# Patient Record
Sex: Male | Born: 1965 | Race: Black or African American | Hispanic: No | Smoking: Current every day smoker
Health system: Southern US, Community
[De-identification: ages and names within clinical notes are randomized; demographics above are authoritative.]

## PROBLEM LIST (undated history)

## (undated) DIAGNOSIS — Z8669 Personal history of other diseases of the nervous system and sense organs: Secondary | ICD-10-CM

## (undated) DIAGNOSIS — I219 Acute myocardial infarction, unspecified: Secondary | ICD-10-CM

## (undated) DIAGNOSIS — I10 Essential (primary) hypertension: Secondary | ICD-10-CM

---

## 1999-12-08 ENCOUNTER — Emergency Department (HOSPITAL_COMMUNITY): Admission: EM | Admit: 1999-12-08 | Discharge: 1999-12-08 | Payer: Self-pay | Admitting: Emergency Medicine

## 1999-12-13 ENCOUNTER — Emergency Department (HOSPITAL_COMMUNITY): Admission: EM | Admit: 1999-12-13 | Discharge: 1999-12-13 | Payer: Self-pay | Admitting: Emergency Medicine

## 1999-12-23 ENCOUNTER — Emergency Department (HOSPITAL_COMMUNITY): Admission: EM | Admit: 1999-12-23 | Discharge: 1999-12-23 | Payer: Self-pay | Admitting: Emergency Medicine

## 2005-07-17 ENCOUNTER — Emergency Department (HOSPITAL_COMMUNITY): Admission: EM | Admit: 2005-07-17 | Discharge: 2005-07-17 | Payer: Self-pay | Admitting: Emergency Medicine

## 2009-10-29 ENCOUNTER — Emergency Department (HOSPITAL_COMMUNITY): Admission: EM | Admit: 2009-10-29 | Discharge: 2009-10-29 | Payer: Self-pay | Admitting: Emergency Medicine

## 2010-09-01 ENCOUNTER — Inpatient Hospital Stay (INDEPENDENT_AMBULATORY_CARE_PROVIDER_SITE_OTHER)
Admission: RE | Admit: 2010-09-01 | Discharge: 2010-09-01 | Disposition: A | Discharge status: Home / Self Care | Payer: Self-pay | Source: Ambulatory Visit | Attending: Family Medicine | Admitting: Family Medicine

## 2010-09-01 DIAGNOSIS — S81809A Unspecified open wound, unspecified lower leg, initial encounter: Secondary | ICD-10-CM

## 2010-09-01 DIAGNOSIS — S81009A Unspecified open wound, unspecified knee, initial encounter: Secondary | ICD-10-CM

## 2012-11-13 ENCOUNTER — Emergency Department (INDEPENDENT_AMBULATORY_CARE_PROVIDER_SITE_OTHER)
Admission: EM | Admit: 2012-11-13 | Discharge: 2012-11-13 | Disposition: A | Discharge status: Home / Self Care | Payer: No Typology Code available for payment source | Source: Home / Self Care | Attending: Family Medicine | Admitting: Family Medicine

## 2012-11-13 ENCOUNTER — Encounter (HOSPITAL_COMMUNITY): Payer: Self-pay | Admitting: *Deleted

## 2012-11-13 DIAGNOSIS — I10 Essential (primary) hypertension: Secondary | ICD-10-CM

## 2012-11-13 HISTORY — DX: Essential (primary) hypertension: I10

## 2012-11-13 MED ORDER — LISINOPRIL-HYDROCHLOROTHIAZIDE 20-25 MG PO TABS: 1.0000 | ORAL_TABLET | Freq: Every day | ORAL | Status: DC

## 2012-11-13 NOTE — ED Provider Notes (Signed)
   History    CSN: [id number] Arrival date & time 11/13/12  1527  First MD Initiated Contact with Patient 11/13/12 1556     Chief Complaint  Patient presents with  . Hypertension  . Medication Refill   (Consider location/radiation/quality/duration/timing/severity/associated sxs/prior Treatment) Patient is a 47 y.o. male presenting with hypertension. The history is provided by the patient.  Hypertension This is a chronic problem. Episode onset: out of med for 1 mo, sx since fri. The problem has been gradually worsening. Associated symptoms include headaches. Pertinent negatives include no chest pain, no abdominal pain and no shortness of breath.   Past Medical History  Diagnosis Date  . Hypertension    History reviewed. No pertinent past surgical history. Family History  Problem Relation Age of Onset  . Hypertension Mother   . Heart attack Mother   . Cancer Father   . Hypertension Father    History  Substance Use Topics  . Smoking status: Current Every Day Smoker -- 1.00 packs/day    Types: Cigarettes  . Smokeless tobacco: Not on file  . Alcohol Use: 4.8 oz/week    8 Cans of beer per week    Review of Systems  Constitutional: Negative.   Respiratory: Negative for shortness of breath.   Cardiovascular: Negative for chest pain.  Gastrointestinal: Negative for abdominal pain.  Neurological: Positive for dizziness and headaches. Negative for weakness and numbness.    Allergies  Review of patient's allergies indicates no known allergies.  Home Medications   Current Outpatient Rx  Name  Route  Sig  Dispense  Refill  . lisinopril-hydrochlorothiazide (PRINZIDE,ZESTORETIC) 20-25 MG per tablet   Oral   Take 1 tablet by mouth daily.         Marland Kitchen lisinopril-hydrochlorothiazide (PRINZIDE,ZESTORETIC) 20-25 MG per tablet   Oral   Take 1 tablet by mouth daily.   30 tablet   1    BP 182/99  Pulse 80  Temp(Src) 98.3 F (36.8 C) (Oral)  Resp 16  SpO2 100% Physical Exam   Nursing note and vitals reviewed. Constitutional: He is oriented to person, place, and time. He appears well-developed and well-nourished.  Eyes: Conjunctivae and EOM are normal. Pupils are equal, round, and reactive to light.  Neck: Normal range of motion. Neck supple.  Cardiovascular: Regular rhythm and normal heart sounds.   Pulmonary/Chest: Breath sounds normal.  Musculoskeletal: He exhibits no edema.  Lymphadenopathy:    He has no cervical adenopathy.  Neurological: He is alert and oriented to person, place, and time.  Skin: Skin is warm and dry.    ED Course  Procedures (including critical care time) Labs Reviewed - No data to display No results found. 1. Hypertension     MDM    Linna Hoff, MD 11/13/12 [phone]

## 2012-11-13 NOTE — ED Notes (Signed)
C/o BP being up.  He has been out of BP medicine for 1 month.  Has had headache since Friday night.  C/o dizziness yesterday and today.  Did not go to work either day.

## 2012-11-14 ENCOUNTER — Encounter (HOSPITAL_COMMUNITY): Payer: Self-pay | Admitting: Emergency Medicine

## 2012-11-14 ENCOUNTER — Emergency Department (HOSPITAL_COMMUNITY)
Admission: EM | Admit: 2012-11-14 | Discharge: 2012-11-14 | Disposition: A | Discharge status: Home / Self Care | Payer: No Typology Code available for payment source | Attending: Emergency Medicine | Admitting: Emergency Medicine

## 2012-11-14 ENCOUNTER — Emergency Department (HOSPITAL_COMMUNITY): Payer: No Typology Code available for payment source

## 2012-11-14 DIAGNOSIS — F17200 Nicotine dependence, unspecified, uncomplicated: Secondary | ICD-10-CM | POA: Insufficient documentation

## 2012-11-14 DIAGNOSIS — I10 Essential (primary) hypertension: Secondary | ICD-10-CM | POA: Insufficient documentation

## 2012-11-14 DIAGNOSIS — Z79899 Other long term (current) drug therapy: Secondary | ICD-10-CM | POA: Insufficient documentation

## 2012-11-14 DIAGNOSIS — R109 Unspecified abdominal pain: Secondary | ICD-10-CM | POA: Insufficient documentation

## 2012-11-14 DIAGNOSIS — K2980 Duodenitis without bleeding: Secondary | ICD-10-CM | POA: Insufficient documentation

## 2012-11-14 LAB — COMPREHENSIVE METABOLIC PANEL
ALT: 16 U/L (ref 0–53)
Albumin: 4 g/dL (ref 3.5–5.2)
Alkaline Phosphatase: 86 U/L (ref 39–117)
BUN: 10 mg/dL (ref 6–23)
CO2: 31 mEq/L (ref 19–32)
Calcium: 10.8 mg/dL — ABNORMAL HIGH (ref 8.4–10.5)
Creatinine, Ser: 1.25 mg/dL (ref 0.50–1.35)
Glucose, Bld: 118 mg/dL — ABNORMAL HIGH (ref 70–99)
Potassium: 4.4 mEq/L (ref 3.5–5.1)
Sodium: 139 mEq/L (ref 135–145)
Total Bilirubin: 0.3 mg/dL (ref 0.3–1.2)
Total Protein: 7.8 g/dL (ref 6.0–8.3)

## 2012-11-14 LAB — URINALYSIS, ROUTINE W REFLEX MICROSCOPIC
Ketones, ur: 15 mg/dL — AB
Leukocytes, UA: NEGATIVE
Urobilinogen, UA: 0.2 mg/dL (ref 0.0–1.0)

## 2012-11-14 LAB — CBC
HCT: 53.5 % — ABNORMAL HIGH (ref 39.0–52.0)
Hemoglobin: 18.5 g/dL — ABNORMAL HIGH (ref 13.0–17.0)

## 2012-11-14 LAB — URINE MICROSCOPIC-ADD ON

## 2012-11-14 MED ORDER — PANTOPRAZOLE SODIUM 40 MG PO TBEC: 40.0000 mg | DELAYED_RELEASE_TABLET | Freq: Every day | ORAL | Status: DC

## 2012-11-14 MED ORDER — HYDROCODONE-ACETAMINOPHEN 5-325 MG PO TABS: 1.0000 | ORAL_TABLET | Freq: Four times a day (QID) | ORAL | Status: DC | PRN

## 2012-11-14 MED ORDER — HYDROCODONE-ACETAMINOPHEN 5-325 MG PO TABS
2.0000 | ORAL_TABLET | Freq: Once | ORAL | Status: AC
  Administered 2012-11-14: 2 via ORAL
  Filled 2012-11-14: qty 2

## 2012-11-14 NOTE — ED Provider Notes (Signed)
History    CSN: [id number] Arrival date & time 11/14/12  1539  First MD Initiated Contact with Patient 11/14/12 1607     Chief Complaint  Patient presents with  . Abdominal Pain   (Consider location/radiation/quality/duration/timing/severity/associated sxs/prior Treatment) Patient is a 47 y.o. male presenting with abdominal pain. The history is provided by the patient.  Abdominal Pain Pertinent negatives include no chest pain, no headaches and no shortness of breath.  pt c/o right flank pain intermittently in the course of the past year. Right flank posteriorly and laterally. Current pain started today. Constant. Dull. Waxes and wanes in intensity. Denies specific exacerbating or alleviating factors. No relation to eating. States was evaluated x 2 in [facility] approximately 8-9 ,months ago for same, had 2 ultrasounds was told were negative for gallstones. Denies hx kidney stones. No dysuria or hematuria. No trauma, fall or strain to area. No abd distension or prior surgery. Having normal bms incl today. No nv. Denies fever or chills. No cough or uri c/o. No pleuritic pain.    Past Medical History  Diagnosis Date  . Hypertension    History reviewed. No pertinent past surgical history. Family History  Problem Relation Age of Onset  . Hypertension Mother   . Heart attack Mother   . Cancer Father   . Hypertension Father    History  Substance Use Topics  . Smoking status: Current Every Day Smoker -- 1.00 packs/day    Types: Cigarettes  . Smokeless tobacco: Not on file  . Alcohol Use: 4.8 oz/week    8 Cans of beer per week    Review of Systems  Constitutional: Negative for fever.  HENT: Negative for neck pain.   Eyes: Negative for redness.  Respiratory: Negative for shortness of breath.   Cardiovascular: Negative for chest pain.  Gastrointestinal: Negative for vomiting, diarrhea and constipation.  Genitourinary: Positive for flank pain. Negative for dysuria, hematuria and  testicular pain.  Musculoskeletal: Negative for back pain.  Skin: Negative for rash.  Neurological: Negative for headaches.  Hematological: Does not bruise/bleed easily.  Psychiatric/Behavioral: Negative for confusion.    Allergies  Review of patient's allergies indicates no known allergies.  Home Medications   Current Outpatient Rx  Name  Route  Sig  Dispense  Refill  . aspirin-acetaminophen-caffeine (EXCEDRIN MIGRAINE) 250-250-65 MG per tablet   Oral   Take 1 tablet by mouth every 6 (six) hours as needed for pain. For pain         . lisinopril-hydrochlorothiazide (PRINZIDE,ZESTORETIC) 20-25 MG per tablet   Oral   Take 1 tablet by mouth daily.          BP 154/100  Pulse 63  Temp(Src) 97.9 F (36.6 C) (Oral)  Resp 18  SpO2 97% Physical Exam  Nursing note and vitals reviewed. Constitutional: He is oriented to person, place, and time. He appears well-developed and well-nourished. No distress.  HENT:  Head: Atraumatic.  Eyes: Conjunctivae are normal. No scleral icterus.  Neck: Neck supple. No tracheal deviation present.  Cardiovascular: Normal rate, regular rhythm, normal heart sounds and intact distal pulses.   Pulmonary/Chest: Effort normal and breath sounds normal. No accessory muscle usage. No respiratory distress.  Abdominal: Soft. Bowel sounds are normal. He exhibits no distension and no mass. There is no tenderness. There is no rebound and no guarding.  Genitourinary:  No cva tenderness  Musculoskeletal: Normal range of motion.  Spine nt.   Neurological: He is alert and oriented to person, place, and  time.  Skin: Skin is warm and dry.  No rash/shingles in area of pain  Psychiatric: He has a normal mood and affect.    ED Course  Procedures (including critical care time)  Results for orders placed during the hospital encounter of 11/14/12  LIPASE, BLOOD      Result Value Range   Lipase 38  11 - 59 U/L  CBC      Result Value Range   WBC 5.4  4.0 - 10.5  K/uL   RBC 5.92 (*) 4.22 - 5.81 MIL/uL   Hemoglobin 18.5 (*) 13.0 - 17.0 g/dL   HCT 69.6 (*) 29.5 - 28.4 %   MCV 90.4  78.0 - 100.0 fL   MCH 31.3  26.0 - 34.0 pg   MCHC 34.6  30.0 - 36.0 g/dL   RDW 13.2  44.0 - 10.2 %   Platelets 166  150 - 400 K/uL  COMPREHENSIVE METABOLIC PANEL      Result Value Range   Sodium 139  135 - 145 mEq/L   Potassium 4.4  3.5 - 5.1 mEq/L   Chloride 101  96 - 112 mEq/L   CO2 31  19 - 32 mEq/L   Glucose, Bld 118 (*) 70 - 99 mg/dL   BUN 10  6 - 23 mg/dL   Creatinine, Ser 7.25  0.50 - 1.35 mg/dL   Calcium 36.6 (*) 8.4 - 10.5 mg/dL   Total Protein 7.8  6.0 - 8.3 g/dL   Albumin 4.0  3.5 - 5.2 g/dL   AST 18  0 - 37 U/L   ALT 16  0 - 53 U/L   Alkaline Phosphatase 86  39 - 117 U/L   Total Bilirubin 0.3  0.3 - 1.2 mg/dL   GFR calc non Af Amer 67 (*) >90 mL/min   GFR calc Af Amer 78 (*) >90 mL/min  URINALYSIS, ROUTINE W REFLEX MICROSCOPIC      Result Value Range   Color, Urine YELLOW  YELLOW   APPearance CLEAR  CLEAR   Specific Gravity, Urine 1.026  1.005 - 1.030   pH 5.0  5.0 - 8.0   Glucose, UA NEGATIVE  NEGATIVE mg/dL   Hgb urine dipstick SMALL (*) NEGATIVE   Bilirubin Urine SMALL (*) NEGATIVE   Ketones, ur 15 (*) NEGATIVE mg/dL   Protein, ur NEGATIVE  NEGATIVE mg/dL   Urobilinogen, UA 0.2  0.0 - 1.0 mg/dL   Nitrite NEGATIVE  NEGATIVE   Leukocytes, UA NEGATIVE  NEGATIVE  URINE MICROSCOPIC-ADD ON      Result Value Range   RBC / HPF 0-2  <3 RBC/hpf   Urine-Other MUCOUS PRESENT     Ct Abdomen Pelvis Wo Contrast  11/14/2012   *RADIOLOGY REPORT*  Clinical Data: Right upper abdominal pain with nausea for 1 year.  CT ABDOMEN AND PELVIS WITHOUT CONTRAST  Technique:  Multidetector CT imaging of the abdomen and pelvis was performed following the standard protocol without intravenous contrast.  Comparison: None.  Findings: Lack of intravenous contrast decreases the sensitivity for entities other than urinary tract calculi.  There is respiratory motion at the  lung bases.  Lung bases are grossly clear.  The proximal duodenum has some fluid within its lumen and there is suggestion of a thickened duodenal wall, involving the proximal duodenum, most prominent the second portion the duodenum.  Single wall thickness of the second portion of the duodenum is estimated to be 10-11 mm in thickness (see image #32).  Additionally, there is  some fluid and stranding adjacent to the proximal duodenum (images 31 and 35).  The stomach is not very distended and appears grossly within normal limits.  Jejunal and ileal loops of the small bowel appear normal. Terminal ileum is unremarkable.  Normal appendix.  The colon is normal in caliber contains scattered diverticula.  The rectum is unremarkable.  No free intraperitoneal air identified.  Negative for urinary tract stones.  Both kidneys are normal in size and contour.  There is no hydronephrosis.  The ureters are normal in caliber and the urinary bladder appears normal.  Prostate gland is normal in size.  Abdominal aorta is normal in caliber.  Negative for lymphadenopathy of free air.  No acute or suspicious osseous abnormality.  IMPRESSION:  1.  Findings suspicious for duodenitis.  Apparent wall thickening of the proximal duodenum with a small amount of adjacent fluid. Suggest correlation with clinical history. Endoscopy could be considered for further evaluation. 2.  Negative for urinary tract stone disease.   Original Report Authenticated By: Britta Mccreedy, M.D.     MDM  Pt has ride, does not have to drive.  Hydrocodone po.  Xr. Labs.   Reviewed nursing notes and prior charts for additional history.   Recheck pt feels much improved. abd soft nt. No distension. No nv.  Will rx protonix, pain rx for possible duodenitis.   With benign abd exam and uremarkable vitals, pt appears stable for d/c .  Suzi Roots, MD 11/14/12 [phone]

## 2012-11-14 NOTE — ED Notes (Signed)
Pt c/o right upper abd pain with nausea x 1 year; pt sts had [place] x 2 without diagnosis

## 2016-06-24 ENCOUNTER — Emergency Department (HOSPITAL_COMMUNITY)
Admission: EM | Admit: 2016-06-24 | Discharge: 2016-06-24 | Disposition: A | Discharge status: Home / Self Care | Payer: No Typology Code available for payment source | Attending: Emergency Medicine | Admitting: Emergency Medicine

## 2016-06-24 ENCOUNTER — Encounter (HOSPITAL_COMMUNITY): Payer: Self-pay | Admitting: Emergency Medicine

## 2016-06-24 DIAGNOSIS — Z7982 Long term (current) use of aspirin: Secondary | ICD-10-CM | POA: Insufficient documentation

## 2016-06-24 DIAGNOSIS — I160 Hypertensive urgency: Secondary | ICD-10-CM | POA: Insufficient documentation

## 2016-06-24 DIAGNOSIS — R519 Headache, unspecified: Secondary | ICD-10-CM

## 2016-06-24 DIAGNOSIS — F17210 Nicotine dependence, cigarettes, uncomplicated: Secondary | ICD-10-CM | POA: Insufficient documentation

## 2016-06-24 DIAGNOSIS — Z79899 Other long term (current) drug therapy: Secondary | ICD-10-CM | POA: Insufficient documentation

## 2016-06-24 DIAGNOSIS — R51 Headache: Secondary | ICD-10-CM

## 2016-06-24 MED ORDER — LISINOPRIL-HYDROCHLOROTHIAZIDE 20-25 MG PO TABS: 1.0000 | ORAL_TABLET | Freq: Every day | ORAL | 0 refills | Status: DC

## 2016-06-24 NOTE — Discharge Instructions (Signed)
As discussed, your evaluation today has been largely reassuring.  But, it is important that you monitor your condition carefully, and do not hesitate to return to the ED if you develop new, or concerning changes in your condition. ? ?Otherwise, please follow-up with your physician for appropriate ongoing care. ? ?

## 2016-06-24 NOTE — ED Notes (Signed)
ED Provider at bedside. 

## 2016-06-24 NOTE — ED Provider Notes (Signed)
[facility]-EMERGENCY DEPT Provider Note   CSN: [id number] Arrival date & time: 06/24/16  1330   By signing my name below, I, Clovis Pu, attest that this documentation has been prepared under the direction and in the presence of Gerhard Munch, MD  Electronically Signed: Clovis Pu, ED Scribe. 06/24/16. 5:14 PM.   History   Chief Complaint Chief Complaint  Patient presents with  . Hypertension  . Migraine   The history is provided by the patient. No language interpreter was used.   HPI Comments:  Lawrence Pope is a 51 y.o. male, with a hx of HTN and migraines, who presents to the Emergency Department complaining of a acute onset, gradually improving headache onset 2 days. Pt reports he did cocaine yesterday. He has had Aleve with relief. He states he took a nap in the waiting room which also provided relief. Pt reports he is currently not on any medication for HTN. Pt denies chest pain, SOB, nausea, vomiting, vision changes or any other associated symptoms. Pt is a smoker.   Past Medical History:  Diagnosis Date  . Hypertension     There are no active problems to display for this patient.   History reviewed. No pertinent surgical history.     Home Medications    Prior to Admission medications   Medication Sig Start Date End Date Taking? Authorizing Provider  aspirin-acetaminophen-caffeine (EXCEDRIN MIGRAINE) [phone] MG per tablet Take 1 tablet by mouth every 6 (six) hours as needed for pain. For pain    Historical Provider, MD  HYDROcodone-acetaminophen (NORCO/VICODIN) 5-325 MG per tablet Take 1-2 tablets by mouth every 6 (six) hours as needed for pain. 11/14/12   Cathren Laine, MD  lisinopril-hydrochlorothiazide (PRINZIDE,ZESTORETIC) 20-25 MG per tablet Take 1 tablet by mouth daily.    Historical Provider, MD  pantoprazole (PROTONIX) 40 MG tablet Take 1 tablet (40 mg total) by mouth daily. 11/14/12   Cathren Laine, MD    Family History Family History  Problem Relation  Age of Onset  . Hypertension Mother   . Heart attack Mother   . Cancer Father   . Hypertension Father     Social History Social History  Substance Use Topics  . Smoking status: Current Every Day Smoker    Packs/day: 1.00    Types: Cigarettes  . Smokeless tobacco: Not on file  . Alcohol use 4.8 oz/week    8 Cans of beer per week     Allergies   Patient has no known allergies.   Review of Systems Review of Systems  Constitutional: Negative for fever.  Eyes: Negative for visual disturbance.  Respiratory: Negative for shortness of breath.   Cardiovascular: Negative for chest pain.  Gastrointestinal: Negative for nausea and vomiting.  Musculoskeletal:       Negative aside from HPI  Skin:       Negative aside from HPI  Allergic/Immunologic: Negative for immunocompromised state.  Neurological: Positive for headaches. Negative for weakness.  All other systems reviewed and are negative.  Physical Exam Updated Vital Signs BP 156/97 (BP Location: Right Arm)   Pulse 79   Temp 97.4 F (36.3 C) (Oral)   Resp 16   Ht 6' 10.5" (2.096 m)   Wt 190 lb (86.2 kg)   SpO2 97%   BMI 19.63 kg/m   Physical Exam  Constitutional: He is oriented to person, place, and time. He appears well-developed. No distress.  HENT:  Head: Normocephalic and atraumatic.  Eyes: Conjunctivae and EOM are normal. Pupils are equal,  round, and reactive to light.  Cardiovascular: Normal rate and regular rhythm.   Pulmonary/Chest: Effort normal. No stridor. No respiratory distress.  Abdominal: He exhibits no distension.  Musculoskeletal: He exhibits no edema.  Neurological: He is alert and oriented to person, place, and time.  Skin: Skin is warm and dry.  Psychiatric: He has a normal mood and affect.  Nursing note and vitals reviewed.  ED Treatments / Results  DIAGNOSTIC STUDIES:  Oxygen Saturation is 97% on RA, normal by my interpretation.    COORDINATION OF CARE:  5:08 PM Discussed treatment  plan with pt at bedside and pt agreed to plan.  EKG with sinus rhythm, rate 53, nonspecific T-wave changes, LVH, abnormal  Procedures Procedures (including critical care time)   Initial Impression / Assessment and Plan / ED Course  I have reviewed the triage vital signs and the nursing notes.  Pertinent labs & imaging results that were available during my care of the patient were reviewed by me and considered in my medical decision making (see chart for details).  Patient with history of migraines, cocaine is presents with headache. No evidence for neurologic dysfunction, distress. Patient's improvement here is reassuring, as is his history Patient does have hypertension, has been noncompliant with medication. Patient provided additional medications, and resources to follow-up with primary care. With no chest pain, dyspnea, no neurologic dysfunction, no indication for additional labs, imaging. Patient discharged in stable condition.  Final Clinical Impressions(s) / ED Diagnoses   Final diagnoses:  Bad headache  Hypertensive urgency    New Prescriptions Current Discharge Medication List     I personally performed the services described in this documentation, which was scribed in my presence. The recorded information has been reviewed and is accurate.        Gerhard Munchobert Jemery Stacey, MD 06/24/16 [phone]

## 2016-06-24 NOTE — ED Triage Notes (Signed)
Pt here for nasal congestion and HA; pt sts has not had his htn meds x 1 year and admit to cocaine last night

## 2016-09-08 ENCOUNTER — Ambulatory Visit (HOSPITAL_COMMUNITY)
Admission: EM | Admit: 2016-09-08 | Discharge: 2016-09-08 | Disposition: A | Discharge status: Home / Self Care | Payer: No Typology Code available for payment source | Attending: Internal Medicine | Admitting: Internal Medicine

## 2016-09-08 ENCOUNTER — Encounter (HOSPITAL_COMMUNITY): Payer: Self-pay | Admitting: Emergency Medicine

## 2016-09-08 DIAGNOSIS — R51 Headache: Secondary | ICD-10-CM

## 2016-09-08 DIAGNOSIS — Z720 Tobacco use: Secondary | ICD-10-CM

## 2016-09-08 DIAGNOSIS — D751 Secondary polycythemia: Secondary | ICD-10-CM

## 2016-09-08 DIAGNOSIS — I10 Essential (primary) hypertension: Secondary | ICD-10-CM

## 2016-09-08 DIAGNOSIS — R519 Headache, unspecified: Secondary | ICD-10-CM

## 2016-09-08 DIAGNOSIS — Z9189 Other specified personal risk factors, not elsewhere classified: Secondary | ICD-10-CM

## 2016-09-08 HISTORY — DX: Personal history of other diseases of the nervous system and sense organs: Z86.69

## 2016-09-08 LAB — POCT I-STAT, CHEM 8
BUN: 11 mg/dL (ref 6–20)
CALCIUM ION: 1.2 mmol/L (ref 1.15–1.40)
CHLORIDE: 108 mmol/L (ref 101–111)
CREATININE: 1.3 mg/dL — AB (ref 0.61–1.24)
Glucose, Bld: 101 mg/dL — ABNORMAL HIGH (ref 65–99)
HCT: 53 % — ABNORMAL HIGH (ref 39.0–52.0)
Hemoglobin: 18 g/dL — ABNORMAL HIGH (ref 13.0–17.0)
Potassium: 3.8 mmol/L (ref 3.5–5.1)
SODIUM: 143 mmol/L (ref 135–145)
TCO2: 26 mmol/L (ref 0–100)

## 2016-09-08 MED ORDER — HYDROCHLOROTHIAZIDE 25 MG PO TABS: 25.0000 mg | ORAL_TABLET | Freq: Every day | ORAL | 0 refills | Status: DC

## 2016-09-08 NOTE — ED Triage Notes (Signed)
[facility] a [facility] of a headache x 2 days. The patient reported some vision changes and a hx of migraines.

## 2016-09-08 NOTE — Discharge Instructions (Signed)
If your symptoms worsen recommend you go immediately to the emergency room for further evaluation and treatment.  Recommend that you contact [facility] schedule an appointment immediately with a primary care physician. With the risk factors and diagnosis discussed at today's visit you are at significant risk for stroke.

## 2016-09-08 NOTE — ED Provider Notes (Signed)
CSN: [id number]     Arrival date & time 09/08/16  1713 History   None    Chief Complaint  Patient presents with  . Migraine   (Consider location/radiation/quality/duration/timing/severity/associated sxs/prior Treatment) HPI Patient comes in today with headache 2 days. States that he has a history of migraines and hypertension. He is noncompliant with therapy. States that he has been off blood pressure meds for about 8-9 months. Does not currently have a primary care physician. Patient was just seen in the emergency room February 28th 2018 for the same complaint. He admits having some blurred vision also states that he has not been wearing his glasses and needs a new prescription for that. Hasn't having some photophobia not bad. Denies lightheadedness dizziness, fatigue, chest pain, shortness of breath, nausea/vomiting. Patient was smoking 3+ packs per week but may be more. He also has a history of cocaine use but states that he has not used this for about 3 months. States that he drinks 1 beer every other day.  States that he needs a refill of his lisinopril. Past Medical History:  Diagnosis Date  . Hx of migraines   . Hypertension    History reviewed. No pertinent surgical history. Family History  Problem Relation Age of Onset  . Hypertension Mother   . Heart attack Mother   . Cancer Father   . Hypertension Father    Social History  Substance Use Topics  . Smoking status: Current Every Day Smoker    Packs/day: 1.00    Types: Cigarettes  . Smokeless tobacco: Never Used  . Alcohol use 4.8 oz/week    8 Cans of beer per week    Review of Systems  Constitutional: Positive for activity change. Negative for chills, fatigue and fever.  HENT: Negative.   Eyes: Positive for photophobia.  Respiratory: Negative.   Cardiovascular: Negative.   Gastrointestinal: Negative.   Genitourinary: Negative.   Musculoskeletal: Negative.   Allergic/Immunologic: Negative.   Neurological: Negative.    Psychiatric/Behavioral: Negative.     Allergies  Patient has no known allergies.  Home Medications   Prior to Admission medications   Medication Sig Start Date End Date Taking? Authorizing Provider  hydrochlorothiazide (HYDRODIURIL) 25 MG tablet Take 1 tablet (25 mg total) by mouth daily. 09/08/16   Naida Sleightwens, Sundae Maners M, PA-C   Meds Ordered and Administered this Visit  Medications - No data to display  BP (!) 177/101 (BP Location: Right Arm)   Pulse 73   Temp 98.5 F (36.9 C) (Oral)   Resp 18   SpO2 97%  No data found.   Physical Exam  Constitutional: He is oriented to person, place, and time. He appears well-developed and well-nourished. No distress.  HENT:  Head: Normocephalic and atraumatic.  Eyes: EOM are normal. Pupils are equal, round, and reactive to light.  Neck: Normal range of motion.  Cardiovascular: Normal rate and regular rhythm.   No murmur heard. Pulmonary/Chest: Breath sounds normal. No respiratory distress.  Abdominal: He exhibits no distension.  Musculoskeletal: Normal range of motion.  Lymphadenopathy:    He has no cervical adenopathy.  Neurological: He is alert and oriented to person, place, and time.  Skin: Skin is warm and dry.    Urgent Care Course     Procedures (including critical care time)  Labs Review Labs Reviewed  POCT I-STAT, CHEM 8 - Abnormal; Notable for the following:       Result Value   Creatinine, Ser 1.30 (*)    Glucose, Bld  101 (*)    Hemoglobin 18.0 (*)    HCT 53.0 (*)    All other components within normal limits    Imaging Review No results found.   Visual Acuity Review  Right Eye Distance:   Left Eye Distance:   Bilateral Distance:    Right Eye Near:   Left Eye Near:    Bilateral Near:         MDM   1. Bad headache   2. Hypertension, unspecified type   3. Nicotine abuse   4. Polycythemia   5. At risk for stroke    Meds ordered this encounter  Medications  . hydrochlorothiazide (HYDRODIURIL) 25 MG  tablet    Sig: Take 1 tablet (25 mg total) by mouth daily.    Dispense:  30 tablet    Refill:  0   Stress to patient the importance of getting established with a primary care physician and he was given the number and address to contact [facility] immediately. Advised that with the diagnosis that was made today along with his risk factors he is at significant risk for stroke. This was discussed in great detail. He voices understanding and use over-the-counter Tylenol and/or ibuprofen as needed for headache. Labs were reviewed with patient today. Serum creatinine 1.30 and hemoglobin 18.0.   Naida Sleight, PA-C 09/08/16 2047

## 2016-10-03 ENCOUNTER — Encounter (HOSPITAL_COMMUNITY): Payer: Self-pay | Admitting: Emergency Medicine

## 2016-10-03 DIAGNOSIS — F17210 Nicotine dependence, cigarettes, uncomplicated: Secondary | ICD-10-CM | POA: Insufficient documentation

## 2016-10-03 DIAGNOSIS — Z79899 Other long term (current) drug therapy: Secondary | ICD-10-CM | POA: Insufficient documentation

## 2016-10-03 DIAGNOSIS — K529 Noninfective gastroenteritis and colitis, unspecified: Secondary | ICD-10-CM | POA: Insufficient documentation

## 2016-10-03 DIAGNOSIS — E876 Hypokalemia: Secondary | ICD-10-CM | POA: Insufficient documentation

## 2016-10-03 DIAGNOSIS — I10 Essential (primary) hypertension: Secondary | ICD-10-CM | POA: Insufficient documentation

## 2016-10-03 DIAGNOSIS — N179 Acute kidney failure, unspecified: Secondary | ICD-10-CM | POA: Insufficient documentation

## 2016-10-03 LAB — COMPREHENSIVE METABOLIC PANEL
ALBUMIN: 2.9 g/dL — AB (ref 3.5–5.0)
ALT: 27 U/L (ref 17–63)
ANION GAP: 12 (ref 5–15)
AST: 31 U/L (ref 15–41)
Alkaline Phosphatase: 53 U/L (ref 38–126)
BILIRUBIN TOTAL: 0.5 mg/dL (ref 0.3–1.2)
BUN: 17 mg/dL (ref 6–20)
CO2: 23 mmol/L (ref 22–32)
Calcium: 8.9 mg/dL (ref 8.9–10.3)
Chloride: 95 mmol/L — ABNORMAL LOW (ref 101–111)
Creatinine, Ser: 1.89 mg/dL — ABNORMAL HIGH (ref 0.61–1.24)
GFR calc Af Amer: 46 mL/min — ABNORMAL LOW (ref >60)
GFR calc non Af Amer: 40 mL/min — ABNORMAL LOW (ref >60)
GLUCOSE: 122 mg/dL — AB (ref 65–99)
POTASSIUM: 2.9 mmol/L — AB (ref 3.5–5.1)
SODIUM: 130 mmol/L — AB (ref 135–145)
TOTAL PROTEIN: 7.3 g/dL (ref 6.5–8.1)

## 2016-10-03 LAB — LIPASE, BLOOD: Lipase: 41 U/L (ref 11–51)

## 2016-10-03 LAB — CBC
HEMATOCRIT: 51.1 % (ref 39.0–52.0)
HEMOGLOBIN: 17.6 g/dL — AB (ref 13.0–17.0)
MCH: 30.3 pg (ref 26.0–34.0)
MCHC: 34.4 g/dL (ref 30.0–36.0)
MCV: 88.1 fL (ref 78.0–100.0)
Platelets: 138 10*3/uL — ABNORMAL LOW (ref 150–400)
RBC: 5.8 MIL/uL (ref 4.22–5.81)
RDW: 13.7 % (ref 11.5–15.5)
WBC: 8.7 10*3/uL (ref 4.0–10.5)

## 2016-10-03 NOTE — ED Notes (Signed)
Updated on wait time at this time.

## 2016-10-03 NOTE — ED Triage Notes (Signed)
Pt states having 10/10 abd pain with nausea, vomiting and diarrhea for the past 3 days, denies any fever or chills.

## 2016-10-04 ENCOUNTER — Emergency Department (HOSPITAL_COMMUNITY)
Admission: EM | Admit: 2016-10-04 | Discharge: 2016-10-04 | Disposition: A | Discharge status: Home / Self Care | Payer: Self-pay | Attending: Emergency Medicine | Admitting: Emergency Medicine

## 2016-10-04 ENCOUNTER — Emergency Department (HOSPITAL_COMMUNITY): Payer: Self-pay

## 2016-10-04 ENCOUNTER — Emergency Department (HOSPITAL_COMMUNITY)
Admission: EM | Admit: 2016-10-04 | Discharge: 2016-10-04 | Disposition: A | Discharge status: Home / Self Care | Payer: No Typology Code available for payment source | Attending: Emergency Medicine | Admitting: Emergency Medicine

## 2016-10-04 ENCOUNTER — Encounter (HOSPITAL_COMMUNITY): Payer: Self-pay | Admitting: Emergency Medicine

## 2016-10-04 DIAGNOSIS — Z79899 Other long term (current) drug therapy: Secondary | ICD-10-CM | POA: Insufficient documentation

## 2016-10-04 DIAGNOSIS — K529 Noninfective gastroenteritis and colitis, unspecified: Secondary | ICD-10-CM

## 2016-10-04 DIAGNOSIS — I10 Essential (primary) hypertension: Secondary | ICD-10-CM | POA: Insufficient documentation

## 2016-10-04 DIAGNOSIS — R197 Diarrhea, unspecified: Secondary | ICD-10-CM | POA: Insufficient documentation

## 2016-10-04 DIAGNOSIS — E876 Hypokalemia: Secondary | ICD-10-CM

## 2016-10-04 DIAGNOSIS — N179 Acute kidney failure, unspecified: Secondary | ICD-10-CM

## 2016-10-04 DIAGNOSIS — R112 Nausea with vomiting, unspecified: Secondary | ICD-10-CM | POA: Insufficient documentation

## 2016-10-04 DIAGNOSIS — J189 Pneumonia, unspecified organism: Secondary | ICD-10-CM | POA: Insufficient documentation

## 2016-10-04 DIAGNOSIS — F17210 Nicotine dependence, cigarettes, uncomplicated: Secondary | ICD-10-CM | POA: Insufficient documentation

## 2016-10-04 LAB — URINALYSIS, ROUTINE W REFLEX MICROSCOPIC
Bacteria, UA: NONE SEEN
Bilirubin Urine: NEGATIVE
Bilirubin Urine: NEGATIVE
GLUCOSE, UA: NEGATIVE mg/dL
GLUCOSE, UA: NEGATIVE mg/dL
KETONES UR: NEGATIVE mg/dL
Ketones, ur: NEGATIVE mg/dL
Leukocytes, UA: NEGATIVE
Leukocytes, UA: NEGATIVE
NITRITE: NEGATIVE
NITRITE: NEGATIVE
PH: 5 (ref 5.0–8.0)
PH: 5 (ref 5.0–8.0)
Protein, ur: 100 mg/dL — AB
Protein, ur: >=300 mg/dL — AB
SPECIFIC GRAVITY, URINE: 1.025 (ref 1.005–1.030)
SPECIFIC GRAVITY, URINE: 1.033 — AB (ref 1.005–1.030)
WBC, UA: NONE SEEN WBC/hpf (ref 0–5)

## 2016-10-04 LAB — CBC WITH DIFFERENTIAL/PLATELET
Basophils Absolute: 0 10*3/uL (ref 0.0–0.1)
Basophils Relative: 0 %
Eosinophils Absolute: 0 10*3/uL (ref 0.0–0.7)
Eosinophils Relative: 0 %
HEMATOCRIT: 42.9 % (ref 39.0–52.0)
HEMOGLOBIN: 14.7 g/dL (ref 13.0–17.0)
LYMPHS ABS: 0.8 10*3/uL (ref 0.7–4.0)
Lymphocytes Relative: 11 %
MCH: 29.8 pg (ref 26.0–34.0)
MCHC: 34.3 g/dL (ref 30.0–36.0)
MCV: 86.8 fL (ref 78.0–100.0)
MONO ABS: 0.5 10*3/uL (ref 0.1–1.0)
Monocytes Relative: 7 %
NEUTROS ABS: 6.3 10*3/uL (ref 1.7–7.7)
NEUTROS PCT: 82 %
Platelets: 123 10*3/uL — ABNORMAL LOW (ref 150–400)
RBC: 4.94 MIL/uL (ref 4.22–5.81)
RDW: 13.5 % (ref 11.5–15.5)
WBC: 7.7 10*3/uL (ref 4.0–10.5)

## 2016-10-04 LAB — COMPREHENSIVE METABOLIC PANEL
ALK PHOS: 44 U/L (ref 38–126)
ALT: 30 U/L (ref 17–63)
ANION GAP: 7 (ref 5–15)
AST: 42 U/L — ABNORMAL HIGH (ref 15–41)
Albumin: 2.3 g/dL — ABNORMAL LOW (ref 3.5–5.0)
BILIRUBIN TOTAL: 0.4 mg/dL (ref 0.3–1.2)
BUN: 13 mg/dL (ref 6–20)
CALCIUM: 8.2 mg/dL — AB (ref 8.9–10.3)
CO2: 22 mmol/L (ref 22–32)
Chloride: 102 mmol/L (ref 101–111)
Creatinine, Ser: 1.21 mg/dL (ref 0.61–1.24)
Glucose, Bld: 131 mg/dL — ABNORMAL HIGH (ref 65–99)
POTASSIUM: 2.9 mmol/L — AB (ref 3.5–5.1)
Sodium: 131 mmol/L — ABNORMAL LOW (ref 135–145)
TOTAL PROTEIN: 5.9 g/dL — AB (ref 6.5–8.1)

## 2016-10-04 LAB — LIPASE, BLOOD: LIPASE: 77 U/L — AB (ref 11–51)

## 2016-10-04 LAB — MAGNESIUM
MAGNESIUM: 1.9 mg/dL (ref 1.7–2.4)
Magnesium: 1.9 mg/dL (ref 1.7–2.4)

## 2016-10-04 LAB — C DIFFICILE QUICK SCREEN W PCR REFLEX
C DIFFICILE (CDIFF) INTERP: NOT DETECTED
C DIFFICILE (CDIFF) TOXIN: NEGATIVE
C Diff antigen: NEGATIVE

## 2016-10-04 LAB — I-STAT CG4 LACTIC ACID, ED: Lactic Acid, Venous: 0.76 mmol/L (ref 0.5–1.9)

## 2016-10-04 LAB — CK: Total CK: 131 U/L (ref 49–397)

## 2016-10-04 MED ORDER — PROMETHAZINE HCL 25 MG PO TABS: 25.0000 mg | ORAL_TABLET | Freq: Four times a day (QID) | ORAL | 0 refills | Status: DC | PRN

## 2016-10-04 MED ORDER — LEVOFLOXACIN 500 MG PO TABS: 500.0000 mg | ORAL_TABLET | Freq: Every day | ORAL | 0 refills | Status: DC

## 2016-10-04 MED ORDER — SODIUM CHLORIDE 0.9 % IV BOLUS (SEPSIS)
1000.0000 mL | Freq: Once | INTRAVENOUS | Status: AC
  Administered 2016-10-04: 1000 mL via INTRAVENOUS

## 2016-10-04 MED ORDER — ONDANSETRON HCL 4 MG/2ML IJ SOLN
4.0000 mg | Freq: Once | INTRAMUSCULAR | Status: AC
  Administered 2016-10-04: 4 mg via INTRAVENOUS
  Filled 2016-10-04: qty 2

## 2016-10-04 MED ORDER — ONDANSETRON 4 MG PO TBDP: 4.0000 mg | ORAL_TABLET | Freq: Three times a day (TID) | ORAL | 0 refills | Status: DC | PRN

## 2016-10-04 MED ORDER — ONDANSETRON HCL 4 MG PO TABS: 4.0000 mg | ORAL_TABLET | Freq: Three times a day (TID) | ORAL | 0 refills | Status: DC | PRN

## 2016-10-04 MED ORDER — ACETAMINOPHEN 325 MG PO TABS: 650.0000 mg | ORAL_TABLET | Freq: Once | ORAL | Status: DC | PRN

## 2016-10-04 MED ORDER — LEVOFLOXACIN 500 MG PO TABS
500.0000 mg | ORAL_TABLET | Freq: Once | ORAL | Status: AC
  Administered 2016-10-04: 500 mg via ORAL
  Filled 2016-10-04: qty 1

## 2016-10-04 MED ORDER — POTASSIUM CHLORIDE CRYS ER 20 MEQ PO TBCR
20.0000 meq | EXTENDED_RELEASE_TABLET | Freq: Once | ORAL | Status: AC
  Administered 2016-10-04: 20 meq via ORAL
  Filled 2016-10-04: qty 1

## 2016-10-04 MED ORDER — IOPAMIDOL (ISOVUE-300) INJECTION 61%
INTRAVENOUS | Status: AC
  Filled 2016-10-04: qty 30

## 2016-10-04 MED ORDER — ACETAMINOPHEN 325 MG PO TABS
ORAL_TABLET | ORAL | Status: AC
  Administered 2016-10-04: 650 mg
  Filled 2016-10-04: qty 2

## 2016-10-04 MED ORDER — POTASSIUM CHLORIDE CRYS ER 20 MEQ PO TBCR: 20.0000 meq | EXTENDED_RELEASE_TABLET | Freq: Every day | ORAL | 0 refills | Status: DC

## 2016-10-04 MED ORDER — IBUPROFEN 400 MG PO TABS
600.0000 mg | ORAL_TABLET | Freq: Once | ORAL | Status: AC
  Administered 2016-10-04: 600 mg via ORAL
  Filled 2016-10-04: qty 1

## 2016-10-04 MED ORDER — POTASSIUM CHLORIDE CRYS ER 20 MEQ PO TBCR
40.0000 meq | EXTENDED_RELEASE_TABLET | Freq: Once | ORAL | Status: AC
  Administered 2016-10-04: 40 meq via ORAL
  Filled 2016-10-04: qty 2

## 2016-10-04 MED ORDER — ACETAMINOPHEN 325 MG PO TABS: 325.0000 mg | ORAL_TABLET | Freq: Once | ORAL | Status: DC

## 2016-10-04 NOTE — ED Notes (Signed)
Patient transported to X-ray 

## 2016-10-04 NOTE — ED Provider Notes (Signed)
The patient is a 51 year old male, he has a history of 3 days of diarrhea and vomiting, states that he has had multiple episodes of watery diarrhea, it is improved over the last 24 hours and is only had 2 episodes. There is no abdominal pain but he has developed a fever of 103. On exam the patient does have some right upper quadrant tenderness which is mild, there is no Murphy sign, there is no other abdominal tenderness. His lungs and heart are clear, no tachycardia, no edema, mucous membranes are slightly dehydrated.  Evaluate with CT scan for further evaluation of the source of the patient's fever and abdominal discomfort though this could be nothing more than a gastroenteritis which is gradually improving. Both potassium and sodium seem better today, IV fluids will be given. Noncontrast CT scan of the abdomen to evaluate further. The patient is in agreement.  Medical screening examination/treatment/procedure(s) were conducted as a shared visit with non-physician practitioner(s) and myself.  I personally evaluated the patient during the encounter.  Clinical Impression:   Final diagnoses:  Nausea vomiting and diarrhea  Community acquired pneumonia, unspecified laterality         Lawrence Pope, Lawrence Hudman, MD 10/06/16 1228

## 2016-10-04 NOTE — ED Notes (Signed)
ED Provider at bedside. 

## 2016-10-04 NOTE — ED Provider Notes (Signed)
[facility]-EMERGENCY DEPT Provider Note   CSN: [id number] Arrival date & time: 10/03/16  1937  By signing my name below, I, Lawrence Pope, attest that this documentation has been prepared under the direction and in the presence of No att. providers found . Electronically Signed: Phillips Pope, Scribe. 10/04/2016. 12:46 AM.  History   Chief Complaint Chief Complaint  Patient presents with  . Abdominal Pain   HPI Comments Lawrence Pope is a 51 y.o. male with a PMHx significant for HTN and migraines, who presents to the Emergency Department with complaints of sudden onset nausea, vomiting and diarrhea x3 days, after eating a steak.  He additionally endorses dizziness.  Pt's diarrhea has improved since onset, however, his nausea and vomiting has remained constant.  Low PO intake 2/2 to aforementioned sx.  He denies experiencing any any abdominal pain. No blood in stool or melena. No hematuria.  No fevers.  Back pain at baseline.   The history is provided by the patient and medical records. No language interpreter was used.    Past Medical History:  Diagnosis Date  . Hx of migraines   . Hypertension     There are no active problems to display for this patient.   History reviewed. No pertinent surgical history.     Home Medications    Prior to Admission medications   Medication Sig Start Date End Date Taking? Authorizing Provider  hydrochlorothiazide (HYDRODIURIL) 25 MG tablet Take 1 tablet (25 mg total) by mouth daily. 09/08/16   Naida Sleight, PA-C  ondansetron (ZOFRAN ODT) 4 MG disintegrating tablet Take 1 tablet (4 mg total) by mouth every 8 (eight) hours as needed for nausea or vomiting. 10/04/16   Pricilla Loveless, MD  potassium chloride SA (K-DUR,KLOR-CON) 20 MEQ tablet Take 1 tablet (20 mEq total) by mouth daily. 10/04/16   Pricilla Loveless, MD  promethazine (PHENERGAN) 25 MG tablet Take 1 tablet (25 mg total) by mouth every 6 (six) hours as needed for nausea or vomiting.  10/04/16   Pricilla Loveless, MD    Family History Family History  Problem Relation Age of Onset  . Hypertension Mother   . Heart attack Mother   . Cancer Father   . Hypertension Father     Social History Social History  Substance Use Topics  . Smoking status: Current Every Day Smoker    Packs/day: 1.00    Types: Cigarettes  . Smokeless tobacco: Never Used  . Alcohol use 4.8 oz/week    8 Cans of beer per week     Allergies   Patient has no known allergies.   Review of Systems Review of Systems  Constitutional: Negative for fever.  Gastrointestinal: Positive for diarrhea (Resolved), nausea and vomiting. Negative for abdominal pain and blood in stool.  Genitourinary: Negative for hematuria.  Musculoskeletal: Positive for back pain (Baseline).  Neurological: Positive for dizziness.   Physical Exam Updated Vital Signs BP (!) 142/88   Pulse 86   Temp 98.4 F (36.9 C) (Oral)   Resp 16   Ht 5\' 11"  (1.803 m)   Wt 86.2 kg (190 lb)   SpO2 96%   BMI 26.50 kg/m   Physical Exam  Constitutional: He is oriented to person, place, and time. He appears well-developed and well-nourished.  HENT:  Head: Normocephalic and atraumatic.  Right Ear: External ear normal.  Left Ear: External ear normal.  Nose: Nose normal.  Mildly dry mucous membranes.   Eyes: Right eye exhibits no discharge. Left eye  exhibits no discharge.  Neck: Neck supple.  Cardiovascular: Normal rate, regular rhythm and normal heart sounds.   Pulmonary/Chest: Effort normal and breath sounds normal.  Abdominal: Soft. There is no tenderness.  No CVA tenderness.  Musculoskeletal: He exhibits no edema.  Neurological: He is alert and oriented to person, place, and time.  Skin: Skin is warm and dry.  Nursing note and vitals reviewed.    ED Treatments / Results  DIAGNOSTIC STUDIES: Oxygen Saturation is 96% on room air, adequate by my interpretation.    COORDINATION OF CARE: 12:28 AM Discussed treatment plan  with pt at bedside and pt agreed to plan.   Labs (all labs ordered are listed, but only abnormal results are displayed) Labs Reviewed  COMPREHENSIVE METABOLIC PANEL - Abnormal; Notable for the following:       Result Value   Sodium 130 (*)    Potassium 2.9 (*)    Chloride 95 (*)    Glucose, Bld 122 (*)    Creatinine, Ser 1.89 (*)    Albumin 2.9 (*)    GFR calc non Af Amer 40 (*)    GFR calc Af Amer 46 (*)    All other components within normal limits  CBC - Abnormal; Notable for the following:    Hemoglobin 17.6 (*)    Platelets 138 (*)    All other components within normal limits  URINALYSIS, ROUTINE W REFLEX MICROSCOPIC - Abnormal; Notable for the following:    Color, Urine AMBER (*)    APPearance CLOUDY (*)    Specific Gravity, Urine 1.033 (*)    Hgb urine dipstick MODERATE (*)    Protein, ur >=300 (*)    Squamous Epithelial / LPF 0-5 (*)    All other components within normal limits  LIPASE, BLOOD  MAGNESIUM  CK    EKG  EKG Interpretation None       Radiology No results found.  Procedures Procedures (including critical care time)  Medications Ordered in ED Medications  sodium chloride 0.9 % bolus 1,000 mL (0 mLs Intravenous Stopped 10/04/16 0200)  sodium chloride 0.9 % bolus 1,000 mL (0 mLs Intravenous Stopped 10/04/16 0107)  ondansetron (ZOFRAN) injection 4 mg (4 mg Intravenous Given 10/04/16 0107)  potassium chloride SA (K-DUR,KLOR-CON) CR tablet 40 mEq (40 mEq Oral Given 10/04/16 0106)     Initial Impression / Assessment and Plan / ED Course  I have reviewed the triage vital signs and the nursing notes.  Pertinent labs & imaging results that were available during my care of the patient were reviewed by me and considered in my medical decision making (see chart for details).     Patient is overall well appearing. Labs indicate acute on chronic renal disease, new Cr of 1.89. Given this, I recommended fluids and observation to ensure improvement and workup  of renal disease. Patient declines. He understands concerns and risk of worsening renal disease, electrolyte disturbance and associated possible morbidity and mortality. He agrees to return if worsening or not improving. Recommend f/u closely with a PCP. Strongly encouraged to return given all of this. No abd pain, abd exam benign.  Final Clinical Impressions(s) / ED Diagnoses   Final diagnoses:  Acute kidney injury (HCC)  Hypokalemia  Gastroenteritis   I personally performed the services described in this documentation, which was scribed in my presence. The recorded information has been reviewed and is accurate.   New Prescriptions Discharge Medication List as of 10/04/2016  2:04 AM    START taking  these medications   Details  ondansetron (ZOFRAN ODT) 4 MG disintegrating tablet Take 1 tablet (4 mg total) by mouth every 8 (eight) hours as needed for nausea or vomiting., Starting Sun 10/04/2016, Print    potassium chloride SA (K-DUR,KLOR-CON) 20 MEQ tablet Take 1 tablet (20 mEq total) by mouth daily., Starting Sun 10/04/2016, Print    promethazine (PHENERGAN) 25 MG tablet Take 1 tablet (25 mg total) by mouth every 6 (six) hours as needed for nausea or vomiting., Starting Sun 10/04/2016, Print         Pricilla LovelessGoldston, Ruben Mahler, MD 10/04/16 [phone]

## 2016-10-04 NOTE — ED Triage Notes (Signed)
Pt here with fever and vomiting; pt seen last night for same

## 2016-10-04 NOTE — ED Provider Notes (Signed)
[facility] Provider Note   CSN: [id number] Arrival date & time: 10/04/16  1010     History   Chief Complaint Chief Complaint  Patient presents with  . Fever  . Emesis    HPI Lawrence Pope is a 51 y.o. male with PMhx of hypertension who presents to the emergency department with complaints of ongoing nausea, diarrhea. Patient was seen here yesterday night/early this morning for 3 day history of nausea, vomiting, diarrhea. He was found to have an acute kidney injury with electrolyte abnormalities Patient advised to stay for fluid resuscitation and observation to ensure improvement and workup of renal disease however the patient declined. He is presenting today because he is "worried about his kidneys" and feels he needs more fluid. He is no longer having vomiting but is having ongoing nausea and has had 2 episodes of diarrhea. He is now also having fever. The patient denies chills, chest pain, shortness of breath, abdominal pain, hematochezia, melena, urinary symptoms. Patient states he does not have a PCP and has been off his blood pressure medication for some time now. The patient has not been on any antibiotics recently.   HPI  Past Medical History:  Diagnosis Date  . Hx of migraines   . Hypertension     There are no active problems to display for this patient.   History reviewed. No pertinent surgical history.     Home Medications    Prior to Admission medications   Medication Sig Start Date End Date Taking? Authorizing Provider  hydrochlorothiazide (HYDRODIURIL) 25 MG tablet Take 1 tablet (25 mg total) by mouth daily. 09/08/16   Naida Sleight, PA-C  levofloxacin (LEVAQUIN) 500 MG tablet Take 1 tablet (500 mg total) by mouth daily. 10/04/16   Emit Kuenzel, Elmer Sow, PA-C  ondansetron (ZOFRAN ODT) 4 MG disintegrating tablet Take 1 tablet (4 mg total) by mouth every 8 (eight) hours as needed for nausea or vomiting. Patient not taking: Reported on 10/04/2016 10/04/16    Pricilla Loveless, MD  ondansetron (ZOFRAN) 4 MG tablet Take 1 tablet (4 mg total) by mouth every 8 (eight) hours as needed for nausea or vomiting. 10/04/16   Kaileah Shevchenko, Elmer Sow, PA-C  potassium chloride SA (K-DUR,KLOR-CON) 20 MEQ tablet Take 1 tablet (20 mEq total) by mouth daily. Patient not taking: Reported on 10/04/2016 10/04/16   Pricilla Loveless, MD  promethazine (PHENERGAN) 25 MG tablet Take 1 tablet (25 mg total) by mouth every 6 (six) hours as needed for nausea or vomiting. Patient not taking: Reported on 10/04/2016 10/04/16   Pricilla Loveless, MD    Family History Family History  Problem Relation Age of Onset  . Hypertension Mother   . Heart attack Mother   . Cancer Father   . Hypertension Father     Social History Social History  Substance Use Topics  . Smoking status: Current Every Day Smoker    Packs/day: 1.00    Types: Cigarettes  . Smokeless tobacco: Never Used  . Alcohol use 4.8 oz/week    8 Cans of beer per week     Allergies   Patient has no known allergies.   Review of Systems Review of Systems  All other systems reviewed and are negative.    Physical Exam Updated Vital Signs BP (!) 142/93   Pulse 67   Temp 97.8 F (36.6 C) (Oral)   Resp 18   SpO2 95%   Physical Exam  Constitutional: He appears well-developed and well-nourished.  HENT:  Head: Normocephalic  and atraumatic.  Mouth/Throat: Uvula is midline and oropharynx is clear and moist. Mucous membranes are dry.  Eyes: Conjunctivae are normal. Pupils are equal, round, and reactive to light.  Neck: Neck supple.  Cardiovascular: Normal rate, regular rhythm and intact distal pulses.   No murmur heard. Pulmonary/Chest: Effort normal and breath sounds normal. He exhibits no tenderness.  Abdominal: Soft. Bowel sounds are normal. There is no tenderness. There is no rebound and no guarding.  Musculoskeletal: He exhibits no edema.  Lymphadenopathy:    He has no cervical adenopathy.  Neurological: He is  alert.  Skin: No rash noted.  Skin warm to touch  Psychiatric: He has a normal mood and affect.  Nursing note and vitals reviewed.    ED Treatments / Results  Labs (all labs ordered are listed, but only abnormal results are displayed) Labs Reviewed  CBC WITH DIFFERENTIAL/PLATELET - Abnormal; Notable for the following:       Result Value   Platelets 123 (*)    All other components within normal limits  COMPREHENSIVE METABOLIC PANEL - Abnormal; Notable for the following:    Sodium 131 (*)    Potassium 2.9 (*)    Glucose, Bld 131 (*)    Calcium 8.2 (*)    Total Protein 5.9 (*)    Albumin 2.3 (*)    AST 42 (*)    All other components within normal limits  LIPASE, BLOOD - Abnormal; Notable for the following:    Lipase 77 (*)    All other components within normal limits  URINALYSIS, ROUTINE W REFLEX MICROSCOPIC - Abnormal; Notable for the following:    APPearance HAZY (*)    Hgb urine dipstick MODERATE (*)    Protein, ur 100 (*)    Bacteria, UA RARE (*)    Squamous Epithelial / LPF 0-5 (*)    All other components within normal limits  C DIFFICILE QUICK SCREEN W PCR REFLEX  MAGNESIUM  I-STAT CG4 LACTIC ACID, ED    EKG  EKG Interpretation None       Radiology Ct Abdomen Pelvis Wo Contrast  Result Date: 10/04/2016 CLINICAL DATA:  Nausea/vomiting, diarrhea, fever EXAM: CT ABDOMEN AND PELVIS WITHOUT CONTRAST TECHNIQUE: Multidetector CT imaging of the abdomen and pelvis was performed following the standard protocol without IV contrast. COMPARISON:  11/14/2012 FINDINGS: Lower chest: Patchy opacities in the lingula and right lower lobe, suspicious for pneumonia, possibly on the basis of aspiration. Hepatobiliary: Unenhanced liver is unremarkable. Gallbladder is unremarkable. No intrahepatic or extrahepatic ductal dilatation. Pancreas: Within normal limits. Spleen: Within normal limits. Adrenals/Urinary Tract: Adrenal glands within normal limits. Kidneys are within normal limits.  No renal, ureteral, or bladder calculi. No hydronephrosis. Bladder is within normal limits. Stomach/Bowel: Stomach is within normal limits. No evidence of bowel obstruction. Normal appendix (coronal image 49). Left colon is decompressed. Vascular/Lymphatic: No evidence of abdominal aortic aneurysm. Atherosclerotic calcifications of the abdominal aorta and branch vessels. No suspicious abdominopelvic lymphadenopathy. Reproductive: Prostate is unremarkable. Other: No abdominopelvic ascites. Musculoskeletal: Mild degenerative changes of the visualized thoracolumbar spine. IMPRESSION: No evidence of bowel obstruction.  Normal appendix. Multifocal patchy opacities in the lingula and right lower lobe, suspicious for multifocal pneumonia, possibly on the basis of aspiration. Electronically Signed   By: Charline Bills M.D.   On: 10/04/2016 15:12   Dg Chest 2 View  Result Date: 10/04/2016 CLINICAL DATA:  Nausea/vomiting, abnormal lung bases on CT abdomen/pelvis EXAM: CHEST  2 VIEW COMPARISON:  None. FINDINGS: Multifocal patchy opacities in  the right lower lobe and left upper lobe/lingula, corresponding to the CT findings, suspicious for multifocal pneumonia. No pleural effusion or pneumothorax. The heart is mildly enlarged. Mild degenerative changes of the visualized thoracolumbar spine. IMPRESSION: Multifocal pneumonia in the right lower lobe and left upper lobe/lingula, corresponding to the CT findings. Electronically Signed   By: Charline Bills M.D.   On: 10/04/2016 15:55    Procedures Procedures (including critical care time)  Medications Ordered in ED Medications  acetaminophen (TYLENOL) tablet 650 mg (not administered)  acetaminophen (TYLENOL) tablet 325 mg (325 mg Oral Not Given 10/04/16 1206)  iopamidol (ISOVUE-300) 61 % injection (not administered)  levofloxacin (LEVAQUIN) tablet 500 mg (not administered)  acetaminophen (TYLENOL) 325 MG tablet (650 mg  Given 10/04/16 1029)  ondansetron (ZOFRAN)  injection 4 mg (4 mg Intravenous Given 10/04/16 1221)  sodium chloride 0.9 % bolus 1,000 mL (0 mLs Intravenous Stopped 10/04/16 1321)  ibuprofen (ADVIL,MOTRIN) tablet 600 mg (600 mg Oral Given 10/04/16 1210)  potassium chloride SA (K-DUR,KLOR-CON) CR tablet 20 mEq (20 mEq Oral Given 10/04/16 1228)     Initial Impression / Assessment and Plan / ED Course  I have reviewed the triage vital signs and the nursing notes.  Pertinent labs & imaging results that were available during my care of the patient were reviewed by me and considered in my medical decision making (see chart for details).     This is a 51 y.o. male who was seen here yesterday night/early this morning and diagnosed with a AKI with hypokalemia (2.9), hyponatremia (130) due to nausea, vomiting, diarrhea. The patient declined staying. He is representing today because is not feeling better, thinks he needs more fluid and is having ongoing diarrhea. The patient has new fever, 103F that was not present yesterday. On exam the patient is warm to the touch. Mucous membranes are dry. No abdominal pain on palpation. Good bowel sounds. No recent antibiotic use.  Repeat labs to ensure patient electrolyte and kidney function not worsening. Will give fluids and nausea medication. Tylenol and Ibuprofen given for fever.   Patient was still slight hyponatremia. Hypokalemia present. Creatinine has returned to normal level.  Lipase elevated, but <3 fold. Most likely inflammatory in nature.   Due to the patient fever of unknown source, continued diarrhea will obtain a C. Diff screen, lactic acid and CT scan of the abdomen. Due to the patient abnormal kidney function yesterday will get without contrast.   Patient is now afebrile with normal respiration rate and heart rate.  C.Diff scan negative. Lactic acid is unremarkable. Urinalysis unchanged from yesterday. Mg normal.   CT scan negative for intra-abdominal process. However there is evidence of  right lower lobe pneumonia, possibly on the basis of aspiration. We'll obtain a 2 view chest x-ray to evaluate for. First dose of antibiotic given prophylactically.  CXR confirms multifocal pneumonia in the right lower lobe and left upper lobe. Discussed with patient results in [facility]. He wishes to go home at this time. We will discharge home on Levaquin with close follow-up to PCP. Patient in agreeance with plan. Resting comfortably on re-evaluation. Return precautions given. Patient told they can return at anytime for worsening or new concerning symptoms.    Final Clinical Impressions(s) / ED Diagnoses   Final diagnoses:  Nausea vomiting and diarrhea  Community acquired pneumonia, unspecified laterality    New Prescriptions New Prescriptions   LEVOFLOXACIN (LEVAQUIN) 500 MG TABLET    Take 1 tablet (500 mg total) by mouth  daily.   ONDANSETRON (ZOFRAN) 4 MG TABLET    Take 1 tablet (4 mg total) by mouth every 8 (eight) hours as needed for nausea or vomiting.     Princella PellegriniMaczis, Raymon Schlarb M, PA-C 10/04/16 1613    Eber HongMiller, Brian, MD 10/06/16 1228

## 2016-10-04 NOTE — Discharge Instructions (Signed)
You have pneumonia. Please take the antibiotic as written to completion. Please follow up at the wellness Center in the next 3 days for follow-up. The wellness center sees individuals even without insurance. Take Zofran for nausea control. You can return to the emergency department for worsening or new concerning symptoms.

## 2016-10-04 NOTE — Discharge Instructions (Signed)
We discussed staying in the hospital because your kidneys have what is called acute kidney injury. This is probably from dehydration and we discussed staying in the hospital for fluids and rechecking. You have decided to go home, if you change your mind or worsen, you need to come back to the emergency department for immediate evaluation. Otherwise follow-up with your doctor in the next 2 days.

## 2016-10-04 NOTE — ED Notes (Signed)
Patient transported to CT 

## 2017-01-18 ENCOUNTER — Encounter (HOSPITAL_COMMUNITY): Payer: Self-pay | Admitting: Emergency Medicine

## 2017-01-18 ENCOUNTER — Emergency Department (HOSPITAL_COMMUNITY)
Admission: EM | Admit: 2017-01-18 | Discharge: 2017-01-19 | Disposition: A | Discharge status: Home / Self Care | Payer: No Typology Code available for payment source | Attending: Emergency Medicine | Admitting: Emergency Medicine

## 2017-01-18 DIAGNOSIS — J029 Acute pharyngitis, unspecified: Secondary | ICD-10-CM | POA: Insufficient documentation

## 2017-01-18 DIAGNOSIS — Z5321 Procedure and treatment not carried out due to patient leaving prior to being seen by health care provider: Secondary | ICD-10-CM | POA: Insufficient documentation

## 2017-01-18 LAB — BASIC METABOLIC PANEL
Anion gap: 6 (ref 5–15)
BUN: 9 mg/dL (ref 6–20)
CALCIUM: 9.1 mg/dL (ref 8.9–10.3)
CO2: 28 mmol/L (ref 22–32)
Chloride: 105 mmol/L (ref 101–111)
Creatinine, Ser: 1.11 mg/dL (ref 0.61–1.24)
GFR calc Af Amer: >60 mL/min (ref >60)
Glucose, Bld: 103 mg/dL — ABNORMAL HIGH (ref 65–99)
POTASSIUM: 3.5 mmol/L (ref 3.5–5.1)
Sodium: 139 mmol/L (ref 135–145)

## 2017-01-18 LAB — I-STAT TROPONIN, ED: Troponin i, poc: 0.01 ng/mL (ref 0.00–0.08)

## 2017-01-18 LAB — CBC
HCT: 49.3 % (ref 39.0–52.0)
HEMOGLOBIN: 16.6 g/dL (ref 13.0–17.0)
MCH: 30.6 pg (ref 26.0–34.0)
MCHC: 33.7 g/dL (ref 30.0–36.0)
MCV: 91 fL (ref 78.0–100.0)
Platelets: 201 10*3/uL (ref 150–400)
RBC: 5.42 MIL/uL (ref 4.22–5.81)
RDW: 13.6 % (ref 11.5–15.5)
WBC: 14.6 10*3/uL — AB (ref 4.0–10.5)

## 2017-01-18 LAB — RAPID STREP SCREEN (MED CTR MEBANE ONLY): STREPTOCOCCUS, GROUP A SCREEN (DIRECT): POSITIVE — AB

## 2017-01-18 MED ORDER — DEXAMETHASONE SODIUM PHOSPHATE 10 MG/ML IJ SOLN: 10.0000 mg | Freq: Once | INTRAMUSCULAR | Status: DC

## 2017-01-18 MED ORDER — KETOROLAC TROMETHAMINE 30 MG/ML IJ SOLN: 15.0000 mg | Freq: Once | INTRAMUSCULAR | Status: DC

## 2017-01-18 MED ORDER — HYDROCHLOROTHIAZIDE 12.5 MG PO CAPS: 12.5000 mg | ORAL_CAPSULE | Freq: Once | ORAL | Status: DC

## 2017-01-18 MED ORDER — HYDROCHLOROTHIAZIDE 25 MG PO TABS: 25.0000 mg | ORAL_TABLET | Freq: Every day | ORAL | Status: DC

## 2017-01-18 MED ORDER — ACETAMINOPHEN 325 MG PO TABS
650.0000 mg | ORAL_TABLET | Freq: Once | ORAL | Status: AC | PRN
  Administered 2017-01-18: 650 mg via ORAL
  Filled 2017-01-18: qty 2

## 2017-01-18 NOTE — ED Triage Notes (Addendum)
Pt comes in with complaints of sore throat over the past few days.  Pt speech affected by swelling.  Airway intact with no SOB but does endorse trouble swallowing.  Throat inspected and white patches seen with tonsillar swelling. Ambulatory.  In no acute distress at this time. Pt also very hypertensive during triage.  Hx of same but lost his prescription.

## 2017-01-18 NOTE — ED Notes (Signed)
Called name to take to a fast track room, but no response from the waiting room.

## 2017-10-20 ENCOUNTER — Emergency Department (HOSPITAL_BASED_OUTPATIENT_CLINIC_OR_DEPARTMENT_OTHER): Payer: Self-pay

## 2017-10-20 ENCOUNTER — Encounter (HOSPITAL_BASED_OUTPATIENT_CLINIC_OR_DEPARTMENT_OTHER): Payer: Self-pay

## 2017-10-20 ENCOUNTER — Emergency Department (HOSPITAL_BASED_OUTPATIENT_CLINIC_OR_DEPARTMENT_OTHER)
Admission: EM | Admit: 2017-10-20 | Discharge: 2017-10-20 | Disposition: A | Discharge status: Home / Self Care | Payer: Self-pay | Attending: Emergency Medicine | Admitting: Emergency Medicine

## 2017-10-20 ENCOUNTER — Other Ambulatory Visit: Payer: Self-pay

## 2017-10-20 DIAGNOSIS — Z79899 Other long term (current) drug therapy: Secondary | ICD-10-CM | POA: Insufficient documentation

## 2017-10-20 DIAGNOSIS — I10 Essential (primary) hypertension: Secondary | ICD-10-CM | POA: Insufficient documentation

## 2017-10-20 DIAGNOSIS — R1011 Right upper quadrant pain: Secondary | ICD-10-CM | POA: Insufficient documentation

## 2017-10-20 DIAGNOSIS — F17210 Nicotine dependence, cigarettes, uncomplicated: Secondary | ICD-10-CM | POA: Insufficient documentation

## 2017-10-20 LAB — URINALYSIS, MICROSCOPIC (REFLEX)

## 2017-10-20 LAB — COMPREHENSIVE METABOLIC PANEL
ALBUMIN: 3.5 g/dL (ref 3.5–5.0)
ALK PHOS: 71 U/L (ref 38–126)
ALT: 18 U/L (ref 0–44)
AST: 18 U/L (ref 15–41)
Anion gap: 3 — ABNORMAL LOW (ref 5–15)
BILIRUBIN TOTAL: 0.5 mg/dL (ref 0.3–1.2)
BUN: 14 mg/dL (ref 6–20)
CALCIUM: 9 mg/dL (ref 8.9–10.3)
CO2: 27 mmol/L (ref 22–32)
CREATININE: 0.99 mg/dL (ref 0.61–1.24)
Chloride: 109 mmol/L (ref 98–111)
GFR calc Af Amer: >60 mL/min (ref >60)
GLUCOSE: 100 mg/dL — AB (ref 70–99)
Potassium: 4.1 mmol/L (ref 3.5–5.1)
Sodium: 139 mmol/L (ref 135–145)
TOTAL PROTEIN: 6.7 g/dL (ref 6.5–8.1)

## 2017-10-20 LAB — CBC WITH DIFFERENTIAL/PLATELET
BASOS ABS: 0 10*3/uL (ref 0.0–0.1)
BASOS PCT: 0 %
Eosinophils Absolute: 0.5 10*3/uL (ref 0.0–0.7)
Eosinophils Relative: 8 %
HEMATOCRIT: 51 % (ref 39.0–52.0)
HEMOGLOBIN: 17.3 g/dL — AB (ref 13.0–17.0)
LYMPHS PCT: 38 %
Lymphs Abs: 2.3 10*3/uL (ref 0.7–4.0)
MCH: 30.6 pg (ref 26.0–34.0)
MCHC: 33.9 g/dL (ref 30.0–36.0)
MCV: 90.3 fL (ref 78.0–100.0)
MONO ABS: 0.6 10*3/uL (ref 0.1–1.0)
Monocytes Relative: 11 %
NEUTROS ABS: 2.7 10*3/uL (ref 1.7–7.7)
Neutrophils Relative %: 43 %
Platelets: 177 10*3/uL (ref 150–400)
RBC: 5.65 MIL/uL (ref 4.22–5.81)
RDW: 13.8 % (ref 11.5–15.5)
WBC: 6.1 10*3/uL (ref 4.0–10.5)

## 2017-10-20 LAB — URINALYSIS, ROUTINE W REFLEX MICROSCOPIC
BILIRUBIN URINE: NEGATIVE
Glucose, UA: NEGATIVE mg/dL
Ketones, ur: NEGATIVE mg/dL
Leukocytes, UA: NEGATIVE
NITRITE: NEGATIVE
Protein, ur: NEGATIVE mg/dL
SPECIFIC GRAVITY, URINE: 1.015 (ref 1.005–1.030)
pH: 7.5 (ref 5.0–8.0)

## 2017-10-20 LAB — LIPASE, BLOOD: LIPASE: 40 U/L (ref 11–51)

## 2017-10-20 NOTE — Discharge Instructions (Signed)
CT scan of your abdomen and pelvis was normal today. I have put information about a GI office that you can follow-up with if you want to seek further evaluation.

## 2017-10-20 NOTE — ED Triage Notes (Signed)
C/o right flank pain "for years"-states pain is worse-NAD-steady gait

## 2017-10-20 NOTE — ED Provider Notes (Signed)
[facility] EMERGENCY DEPARTMENT Provider Note  CSN: [id number] Arrival date & time: 10/20/17  1212   History   Chief Complaint Chief Complaint  Patient presents with  . Flank Pain    HPI Lawrence Pope is a 52 y.o. male with a medical history of migraines and HTN who presented to the ED for right side abdominal pain x7 years. Patient came to the ED because "I finally got tired of it." He describes intermittent, cramping pain that is mainly in the RUQ, but sometimes radiates to the flank. He states it is worse with eating, but will resolve shortly after eating. Patient states he has received numerous abdominal imaging out of state over the last few years without a cause identified. Denies any abdominal surgeries. Denies fever, N/V/D/C, changes in urinary habits (hematuria, dysuria, urgency, frequency), hematochezia/melena, chest pain, heartburn or SOB. Patient has tried nothing prior to coming to the ED.  Past Medical History:  Diagnosis Date  . Hx of migraines   . Hypertension     There are no active problems to display for this patient.   History reviewed. No pertinent surgical history.      Home Medications    Prior to Admission medications   Medication Sig Start Date End Date Taking? Authorizing Provider  hydrochlorothiazide (HYDRODIURIL) 25 MG tablet Take 1 tablet (25 mg total) by mouth daily. 09/08/16   Naida Sleightwens, James M, PA-C  levofloxacin (LEVAQUIN) 500 MG tablet Take 1 tablet (500 mg total) by mouth daily. 10/04/16   Maczis, Elmer SowMichael M, PA-C  ondansetron (ZOFRAN ODT) 4 MG disintegrating tablet Take 1 tablet (4 mg total) by mouth every 8 (eight) hours as needed for nausea or vomiting. Patient not taking: Reported on 10/04/2016 10/04/16   Pricilla LovelessGoldston, Scott, MD  ondansetron (ZOFRAN) 4 MG tablet Take 1 tablet (4 mg total) by mouth every 8 (eight) hours as needed for nausea or vomiting. 10/04/16   Maczis, Elmer SowMichael M, PA-C  potassium chloride SA (K-DUR,KLOR-CON) 20 MEQ tablet  Take 1 tablet (20 mEq total) by mouth daily. Patient not taking: Reported on 10/04/2016 10/04/16   Pricilla LovelessGoldston, Scott, MD  promethazine (PHENERGAN) 25 MG tablet Take 1 tablet (25 mg total) by mouth every 6 (six) hours as needed for nausea or vomiting. Patient not taking: Reported on 10/04/2016 10/04/16   Pricilla LovelessGoldston, Scott, MD    Family History Family History  Problem Relation Age of Onset  . Hypertension Mother   . Heart attack Mother   . Cancer Father   . Hypertension Father     Social History Social History   Tobacco Use  . Smoking status: Current Every Day Smoker    Packs/day: 1.00    Types: Cigarettes  . Smokeless tobacco: Never Used  Substance Use Topics  . Alcohol use: Yes    Alcohol/week: 4.8 oz    Types: 8 Cans of beer per week    Comment: occ  . Drug use: Not Currently     Allergies   Patient has no known allergies.   Review of Systems Review of Systems  Constitutional: Negative for activity change, appetite change, chills and fever.  Respiratory: Negative.   Cardiovascular: Negative.   Gastrointestinal: Positive for abdominal pain. Negative for blood in stool, constipation, diarrhea, nausea and vomiting.  Genitourinary: Negative for dysuria, frequency, hematuria and urgency.  Musculoskeletal: Negative.   Skin: Negative.   Neurological: Negative.      Physical Exam Updated Vital Signs BP (!) 170/115   Pulse (!) 56  Temp 98.2 F (36.8 C) (Oral)   Resp 16   Ht 5\' 11"  (1.803 m)   Wt 80.7 kg (178 lb)   SpO2 100%   BMI 24.83 kg/m   Physical Exam  Constitutional: He appears well-developed and well-nourished. He is cooperative.  Non-toxic appearance. He does not have a sickly appearance. No distress.  Eyes: Pupils are equal, round, and reactive to light. Conjunctivae and EOM are normal.  Neck: Normal range of motion. Neck supple.  Cardiovascular: Normal rate, regular rhythm, normal heart sounds and intact distal pulses.  Pulmonary/Chest: Effort normal and  breath sounds normal.  Abdominal: Soft. Bowel sounds are normal. He exhibits no distension. There is tenderness in the right upper quadrant. There is no rigidity, no guarding and no CVA tenderness.  Neurological: He is alert.  Skin: Skin is warm and dry. Capillary refill takes less than 2 seconds.  Nursing note and vitals reviewed.    ED Treatments / Results  Labs (all labs ordered are listed, but only abnormal results are displayed) Labs Reviewed  URINALYSIS, ROUTINE W REFLEX MICROSCOPIC - Abnormal; Notable for the following components:      Result Value   APPearance CLOUDY (*)    Hgb urine dipstick TRACE (*)    All other components within normal limits  URINALYSIS, MICROSCOPIC (REFLEX) - Abnormal; Notable for the following components:   Bacteria, UA RARE (*)    All other components within normal limits  CBC WITH DIFFERENTIAL/PLATELET - Abnormal; Notable for the following components:   Hemoglobin 17.3 (*)    All other components within normal limits  COMPREHENSIVE METABOLIC PANEL - Abnormal; Notable for the following components:   Glucose, Bld 100 (*)    Anion gap 3 (*)    All other components within normal limits  LIPASE, BLOOD    EKG None  Radiology Ct Abdomen Pelvis Wo Contrast  Result Date: 10/20/2017 CLINICAL DATA:  Right flank pain for several years, worsened recently. EXAM: CT ABDOMEN AND PELVIS WITHOUT CONTRAST TECHNIQUE: Multidetector CT imaging of the abdomen and pelvis was performed following the standard protocol without IV contrast. COMPARISON:  CT abdomen pelvis-10/04/2016 FINDINGS: The lack of intravenous contrast limits the ability to evaluate solid abdominal organs. Lower chest: Limited visualization of the lower thorax demonstrates minimal dependent bibasilar subsegmental atelectasis. No focal airspace opacities. No pleural effusion. Normal heart size.  No pericardial effusion. Hepatobiliary: Normal hepatic contour. Nodular areas of hypoattenuation adjacent to  the fissure for the ligamentum teres likely represents areas of focal fatty infiltration versus hepatic cysts. No ascites. Normal noncontrast appearance of the gallbladder given degree distention. No radiopaque gallstones. No definitive gallbladder wall thickening or pericholecystic stranding on this noncontrast examination. Pancreas: Normal noncontrast appearance of the pancreas Spleen: Normal noncontrast appearance of the spleen Adrenals/Urinary Tract: Normal noncontrast appearance of the bilateral kidneys. No renal stones. No renal stones are seen along the expected course of either ureter or the urinary bladder. No urine obstruction or perinephric stranding. Normal noncontrast appearance of the bilateral adrenal glands. Normal appearance of the urinary bladder given degree distention. Stomach/Bowel: Ingested enteric contrast is seen extending to the level of the hepatic flexure of the colon. Moderate colonic stool burden without evidence of enteric obstruction. Normal noncontrast appearance of the terminal ileum and retrocecal appendix. No pneumoperitoneum, pneumatosis or portal venous gas. Vascular/Lymphatic: Atherosclerotic plaque within a normal caliber abdominal aorta. No definitive bulky retroperitoneal, mesenteric, pelvic or inguinal lymphadenopathy on this noncontrast examination. Reproductive: Dystrophic calcifications within normal sized prostate gland. No  free fluid the pelvic cul-de-sac. Other: Regional soft tissues appear normal. Musculoskeletal: No acute or aggressive osseous abnormalities. Mild multilevel lumbar spine DDD. IMPRESSION: 1. No explanation for patient's chronic right-sided flank pain. Specifically, no noncontrast evidence of acute cholecystitis. No evidence of nephrolithiasis, enteric or urinary obstruction. Normal noncontrast appearance of the appendix. 2.  Aortic Atherosclerosis (ICD10-I70.0). Electronically Signed   By: Simonne Come M.D.   On: 10/20/2017 16:55     Procedures Procedures (including critical care time)  Medications Ordered in ED Medications - No data to display   Initial Impression / Assessment and Plan / ED Course  Triage vital signs and the nursing notes have been reviewed.  Pertinent labs & imaging results that were available during care of the patient were reviewed and considered in medical decision making (see chart for details).  Clinical Course as of Oct 20 1956  Wed Oct 20, 2017  1712 CBC, CMP and lipase are reassuring as they are normal. UA is grossly normal besides trace Hgb seen. Given history, nephrolithiasis is possible. Evaluation with CT abdomen/pelvis will give more info about this.   [GM]  1713 BP elevated. Patient currently asymptomatic. No signs of end organ damage on physical exam or with labs. He admits to being without antihypertensives for 4 years. Advised to follow-up with PCP to discuss medication management.   [GM]  1745 CT abdomen and pelvis normal. No acute abdominal abnormalities that give definitive cause for pt's symptoms. Gallbladder distention seen which could possibly be a cause, but no inflammation or stones seen.   [GM]    Clinical Course User Index [GM] Windy Carina, [place]   Patient presents well appearing and in no acute distress. Patient states he is asymptomatic, but had RUQ pain on abdominal exam. Labs and imaging do not show clear etiology to explain patient's symptoms. However, it is clear that there is not an acute process occurring that requires further evaluation today. Education provided on OTC, supportive and lifestyle modifications that could be made to assist with symptom relief. Will give information for GI provider if patient decides to pursue further work-up.  Final Clinical Impressions(s) / ED Diagnoses  1. RUQ Abdominal Pain. Etiology unclear. Referral to GI given. 2. Hypertension. Asymptomatic. No signs of end organ damage. Advised to follow-up with PCP for  management.  Dispo: Home. After thorough clinical evaluation, this patient is determined to be medically stable and can be safely discharged with the previously mentioned treatment and/or outpatient follow-up/referral(s). At this time, there are no other apparent medical conditions that require further screening, evaluation or treatment.   Final diagnoses:  Right upper quadrant abdominal pain  Essential hypertension    ED Discharge Orders    None        Windy Carina, PA-C 10/20/17 1958    Melene Plan, DO 10/20/17 2057

## 2018-08-02 ENCOUNTER — Encounter (HOSPITAL_BASED_OUTPATIENT_CLINIC_OR_DEPARTMENT_OTHER): Payer: Self-pay | Admitting: *Deleted

## 2018-08-02 ENCOUNTER — Emergency Department (HOSPITAL_BASED_OUTPATIENT_CLINIC_OR_DEPARTMENT_OTHER)
Admission: EM | Admit: 2018-08-02 | Discharge: 2018-08-02 | Disposition: A | Discharge status: Home / Self Care | Payer: Self-pay | Attending: Emergency Medicine | Admitting: Emergency Medicine

## 2018-08-02 ENCOUNTER — Other Ambulatory Visit: Payer: Self-pay

## 2018-08-02 DIAGNOSIS — I10 Essential (primary) hypertension: Secondary | ICD-10-CM | POA: Insufficient documentation

## 2018-08-02 DIAGNOSIS — Z9119 Patient's noncompliance with other medical treatment and regimen: Secondary | ICD-10-CM | POA: Insufficient documentation

## 2018-08-02 DIAGNOSIS — G44209 Tension-type headache, unspecified, not intractable: Secondary | ICD-10-CM | POA: Insufficient documentation

## 2018-08-02 DIAGNOSIS — F17210 Nicotine dependence, cigarettes, uncomplicated: Secondary | ICD-10-CM | POA: Insufficient documentation

## 2018-08-02 LAB — CBC WITH DIFFERENTIAL/PLATELET
Abs Immature Granulocytes: 0.01 10*3/uL (ref 0.00–0.07)
Basophils Absolute: 0.1 10*3/uL (ref 0.0–0.1)
Basophils Relative: 1 %
Eosinophils Absolute: 0.4 10*3/uL (ref 0.0–0.5)
Eosinophils Relative: 6 %
HCT: 48.9 % (ref 39.0–52.0)
Hemoglobin: 15.6 g/dL (ref 13.0–17.0)
Immature Granulocytes: 0 %
Lymphocytes Relative: 39 %
Lymphs Abs: 2.7 10*3/uL (ref 0.7–4.0)
MCH: 29.9 pg (ref 26.0–34.0)
MCHC: 31.9 g/dL (ref 30.0–36.0)
MCV: 93.7 fL (ref 80.0–100.0)
Monocytes Absolute: 0.6 10*3/uL (ref 0.1–1.0)
Monocytes Relative: 9 %
Neutro Abs: 3.2 10*3/uL (ref 1.7–7.7)
Neutrophils Relative %: 45 %
Platelets: 199 10*3/uL (ref 150–400)
RBC: 5.22 MIL/uL (ref 4.22–5.81)
RDW: 14 % (ref 11.5–15.5)
WBC: 7 10*3/uL (ref 4.0–10.5)
nRBC: 0 % (ref 0.0–0.2)

## 2018-08-02 LAB — COMPREHENSIVE METABOLIC PANEL
ALT: 11 U/L (ref 0–44)
AST: 17 U/L (ref 15–41)
Albumin: 3.4 g/dL — ABNORMAL LOW (ref 3.5–5.0)
Alkaline Phosphatase: 77 U/L (ref 38–126)
Anion gap: 7 (ref 5–15)
BUN: 7 mg/dL (ref 6–20)
CO2: 22 mmol/L (ref 22–32)
Calcium: 9 mg/dL (ref 8.9–10.3)
Chloride: 109 mmol/L (ref 98–111)
Creatinine, Ser: 1.12 mg/dL (ref 0.61–1.24)
GFR calc Af Amer: >60 mL/min (ref >60)
GFR calc non Af Amer: >60 mL/min (ref >60)
Glucose, Bld: 90 mg/dL (ref 70–99)
Potassium: 3.6 mmol/L (ref 3.5–5.1)
Sodium: 138 mmol/L (ref 135–145)
Total Bilirubin: 0.6 mg/dL (ref 0.3–1.2)
Total Protein: 6.7 g/dL (ref 6.5–8.1)

## 2018-08-02 LAB — TROPONIN I: Troponin I: <0.03 ng/mL (ref <0.03)

## 2018-08-02 LAB — URINALYSIS, ROUTINE W REFLEX MICROSCOPIC
Bilirubin Urine: NEGATIVE
Glucose, UA: NEGATIVE mg/dL
Ketones, ur: 15 mg/dL — AB
Leukocytes,Ua: NEGATIVE
Nitrite: NEGATIVE
Protein, ur: NEGATIVE mg/dL
Specific Gravity, Urine: >1.03 — ABNORMAL HIGH (ref 1.005–1.030)
pH: 5.5 (ref 5.0–8.0)

## 2018-08-02 LAB — URINALYSIS, MICROSCOPIC (REFLEX): WBC, UA: NONE SEEN WBC/hpf (ref 0–5)

## 2018-08-02 MED ORDER — ACETAMINOPHEN 500 MG PO TABS
1000.0000 mg | ORAL_TABLET | Freq: Once | ORAL | Status: AC
Start: 2018-08-02 — End: 2018-08-02
  Administered 2018-08-02: 1000 mg via ORAL
  Filled 2018-08-02: qty 2

## 2018-08-02 MED ORDER — LISINOPRIL 10 MG PO TABS: 10.0000 mg | ORAL_TABLET | Freq: Every day | ORAL | 0 refills | Status: DC

## 2018-08-02 NOTE — ED Notes (Signed)
Pt on monitor 

## 2018-08-02 NOTE — Discharge Instructions (Signed)
As we discussed, though you do have high blood pressure (hypertension), fortunately it is not immediately dangerous at this time and does not need emergency intervention or admission to the hospital. I am re-starting your prior blood pressure medication but you need to see your PCP as soon as possible to make sure this medication is working. Please follow up in clinic as recommended in these papers.    Return to the Emergency Department (ED) if you experience any worsening chest pain/pressure/tightness, difficulty breathing, or sudden sweating, or other symptoms that concern you.   Hypertension Hypertension, commonly called high blood pressure, is when the force of blood pumping through your arteries is too strong. Your arteries are the blood vessels that carry blood from your heart throughout your body. A blood pressure reading consists of a higher number over a lower number, such as 110/72. The higher number (systolic) is the pressure inside your arteries when your heart pumps. The lower number (diastolic) is the pressure inside your arteries when your heart relaxes. Ideally you want your blood pressure below 120/80. Hypertension forces your heart to work harder to pump blood. Your arteries may become narrow or stiff. Having hypertension puts you at risk for heart disease, stroke, and other problems.  RISK FACTORS Some risk factors for high blood pressure are controllable. Others are not.  Risk factors you cannot control include:  Race. You may be at higher risk if you are African American. Age. Risk increases with age. Gender. Men are at higher risk than women before age 8 years. After age 40, women are at higher risk than men. Risk factors you can control include: Not getting enough exercise or physical activity. Being overweight. Getting too much fat, sugar, calories, or salt in your diet. Drinking too much alcohol. SIGNS AND SYMPTOMS Hypertension does not usually cause signs or symptoms.  Extremely high blood pressure (hypertensive crisis) may cause headache, anxiety, shortness of breath, and nosebleed. DIAGNOSIS  To check if you have hypertension, your health care provider will measure your blood pressure while you are seated, with your arm held at the level of your heart. It should be measured at least twice using the same arm. Certain conditions can cause a difference in blood pressure between your right and left arms. A blood pressure reading that is higher than normal on one occasion does not mean that you need treatment. If one blood pressure reading is high, ask your health care provider about having it checked again. TREATMENT  Treating high blood pressure includes making lifestyle changes and possibly taking medicine. Living a healthy lifestyle can help lower high blood pressure. You may need to change some of your habits. Lifestyle changes may include: Following the DASH diet. This diet is high in fruits, vegetables, and whole grains. It is low in salt, red meat, and added sugars. Getting at least 2 hours of brisk physical activity every week. Losing weight if necessary. Not smoking. Limiting alcoholic beverages. Learning ways to reduce stress.  If lifestyle changes are not enough to get your blood pressure under control, your health care provider may prescribe medicine. You may need to take more than one. Work closely with your health care provider to understand the risks and benefits. HOME CARE INSTRUCTIONS Have your blood pressure rechecked as directed by your health care provider.   Take medicines only as directed by your health care provider. Follow the directions carefully. Blood pressure medicines must be taken as prescribed. The medicine does not work as well when  you skip doses. Skipping doses also puts you at risk for problems.   Do not smoke.   Monitor your blood pressure at home as directed by your health care provider.  SEEK MEDICAL CARE IF:  You think you  are having a reaction to medicines taken. You have recurrent headaches or feel dizzy. You have swelling in your ankles. You have trouble with your vision. SEEK IMMEDIATE MEDICAL CARE IF: You develop a severe headache or confusion. You have unusual weakness, numbness, or feel faint. You have severe chest or abdominal pain. You vomit repeatedly. You have trouble breathing. MAKE SURE YOU:  Understand these instructions. Will watch your condition. Will get help right away if you are not doing well or get worse. Document Released: 04/13/2005 Document Revised: 08/28/2013 Document Reviewed: 02/03/2013 [facility] Patient Information 2015 [place], [place]. This information is not intended to replace advice given to you by your health care provider. Make sure you discuss any questions you have with your health care provider.  How to Take Your Blood Pressure HOW DO I GET A BLOOD PRESSURE MACHINE? You can buy an electronic home blood pressure machine at your local pharmacy. Insurance will sometimes cover the cost if you have a prescription. Ask your doctor what type of machine is best for you. There are different machines for your arm and your wrist. If you decide to buy a machine to check your blood pressure on your arm, first check the size of your arm so you can buy the right size cuff. To check the size of your arm:   Use a measuring tape that shows both inches and centimeters.   Wrap the measuring tape around the upper-middle part of your arm. You may need someone to help you measure.   Write down your arm measurement in both inches and centimeters.   To measure your blood pressure correctly, it is important to have the right size cuff.   If your arm is up to 13 inches (up to 34 centimeters), get an adult cuff size. If your arm is 13 to 17 inches (35 to 44 centimeters), get a large adult cuff size.    If your arm is 17 to 20 inches (45 to 52 centimeters), get an adult thigh cuff.   WHAT DO THE  NUMBERS MEAN?  There are two numbers that make up your blood pressure. For example: 120/80. The first number (120 in our example) is called the "systolic pressure." It is a measure of the pressure in your blood vessels when your heart is pumping blood. The second number (80 in our example) is called the "diastolic pressure." It is a measure of the pressure in your blood vessels when your heart is resting between beats. Your doctor will tell you what your blood pressure should be. WHAT SHOULD I DO BEFORE I CHECK MY BLOOD PRESSURE?  Try to rest or relax for at least 30 minutes before you check your blood pressure. Do not smoke. Do not have any drinks with caffeine, such as: Soda. Coffee. Tea. Check your blood pressure in a quiet room. Sit down and stretch out your arm on a table. Keep your arm at about the level of your heart. Let your arm relax. Make sure that your legs are not crossed. HOW DO I CHECK MY BLOOD PRESSURE? Follow the directions that came with your machine. Make sure you remove any tight-fighting clothing from your arm or wrist. Wrap the cuff around your upper arm or wrist. You should be  able to fit a finger between the cuff and your arm. If you cannot fit a finger between the cuff and your arm, it is too tight and should be removed and rewrapped. Some units require you to manually pump up the arm cuff. Automatic units inflate the cuff when you press a button. Cuff deflation is automatic in both models. After the cuff is inflated, the unit measures your blood pressure and pulse. The readings are shown on a monitor. Hold still and breathe normally while the cuff is inflated. Getting a reading takes less than a minute. Some models store readings in a memory. Some provide a printout of readings. If your machine does not store your readings, keep a written record. Take readings with you to your next visit with your doctor. Document Released: 03/26/2008 Document Revised: 08/28/2013  Document Reviewed: 06/08/2013 [facility] Patient Information 2015 [place], [place]. This information is not intended to replace advice given to you by your health care provider. Make sure you discuss any questions you have with your health care provider.

## 2018-08-02 NOTE — ED Triage Notes (Signed)
Stopped taking his BP medication a year ago. He is having headaches.

## 2018-08-02 NOTE — ED Notes (Signed)
Pt. Reports he has a slight headache with c/o issues with his blood pressure.  Pt. In no distress

## 2018-08-02 NOTE — ED Provider Notes (Signed)
Emergency Department Provider Note   I have reviewed the triage vital signs and the nursing notes.   HISTORY  Chief Complaint Headache   HPI Lawrence Pope is a 53 y.o. male with PMH of HTN but non-compliant with medication for the last year presents to the emergency department with elevated blood pressure and intermittent headaches.  Patient states he is developed some mild, frontal headaches over the past several days.  He states he typically gets headaches like this with allergies but was feeling some anxiety about not taking his blood pressure medications.  He denies any sudden onset, maximal intensity headache symptoms.  He has occasional blurry vision but this does not appear to be related to his headaches.  He denies any unilateral vision change.  No weakness or numbness.  No difficulty walking.  No speech change or difficulty swallowing.  She was previously on lisinopril 10 mg tablets.  He also smokes cigarettes.  He denies any chest pain, shortness of breath, heart palpitations.   Past Medical History:  Diagnosis Date  . Hx of migraines   . Hypertension     There are no active problems to display for this patient.   History reviewed. No pertinent surgical history.  Allergies Patient has no known allergies.  Family History  Problem Relation Age of Onset  . Hypertension Mother   . Heart attack Mother   . Cancer Father   . Hypertension Father     Social History Social History   Tobacco Use  . Smoking status: Current Every Day Smoker    Packs/day: 1.00    Types: Cigarettes  . Smokeless tobacco: Never Used  Substance Use Topics  . Alcohol use: Yes    Alcohol/week: 8.0 standard drinks    Types: 8 Cans of beer per week    Comment: occ  . Drug use: Not Currently    Review of Systems  Constitutional: No fever/chills Eyes: No visual changes. ENT: No sore throat. Cardiovascular: Denies chest pain. Positive elevated BP.  Respiratory: Denies shortness of  breath. Gastrointestinal: No abdominal pain.  No nausea, no vomiting.  No diarrhea.  No constipation. Genitourinary: Negative for dysuria. Musculoskeletal: Negative for back pain. Skin: Negative for rash. Neurological: Negative for focal weakness or numbness. Positive HA.   10-point ROS otherwise negative.  ____________________________________________   PHYSICAL EXAM:  VITAL SIGNS: ED Triage Vitals  Enc Vitals Group     BP 08/02/18 2000 (!) 190/114     Pulse Rate 08/02/18 2000 80     Resp 08/02/18 2000 18     Temp 08/02/18 2000 98.7 F (37.1 C)     Temp Source 08/02/18 2000 Oral     SpO2 08/02/18 2000 98 %     Weight 08/02/18 1959 155 lb (70.3 kg)     Height 08/02/18 1959 5' 10.5" (1.791 m)     Pain Score 08/02/18 1959 3   Constitutional: Alert and oriented. Well appearing and in no acute distress. Eyes: Conjunctivae are normal.  Head: Atraumatic. Nose: No congestion/rhinnorhea. Mouth/Throat: Mucous membranes are moist. Neck: No stridor.  Cardiovascular: Good peripheral circulation.  Respiratory: Normal respiratory effort.  Gastrointestinal: Soft and nontender. No distention.  Musculoskeletal: No lower extremity tenderness nor edema. No gross deformities of extremities. Neurologic:  Normal speech and language. No gross focal neurologic deficits are appreciated. Normal CN exam 2-12.  Skin:  Skin is warm, dry and intact. No rash noted.  ____________________________________________   LABS (all labs ordered are listed, but only abnormal  results are displayed)  Labs Reviewed  COMPREHENSIVE METABOLIC PANEL - Abnormal; Notable for the following components:      Result Value   Albumin 3.4 (*)    All other components within normal limits  URINALYSIS, ROUTINE W REFLEX MICROSCOPIC - Abnormal; Notable for the following components:   Specific Gravity, Urine >1.030 (*)    Hgb urine dipstick MODERATE (*)    Ketones, ur 15 (*)    All other components within normal limits   URINALYSIS, MICROSCOPIC (REFLEX) - Abnormal; Notable for the following components:   Bacteria, UA RARE (*)    All other components within normal limits  CBC WITH DIFFERENTIAL/PLATELET  TROPONIN I   ____________________________________________  EKG   EKG Interpretation  Date/Time:  Tuesday August 02 2018 20:10:27 EDT Ventricular Rate:  93 PR Interval:    QRS Duration: 100 QT Interval:  369 QTC Calculation: 459 R Axis:   33 Text Interpretation:  Sinus rhythm ST changes similar to prior.  No STEMI.  Confirmed by Alona BeneLong, Shaylie Eklund [phone]([id number]) on 08/02/2018 8:18:46 PM       ____________________________________________  RADIOLOGY  None  ____________________________________________   PROCEDURES  Procedure(s) performed:   Procedures  None  ____________________________________________   INITIAL IMPRESSION / ASSESSMENT AND PLAN / ED COURSE  Pertinent labs & imaging results that were available during my care of the patient were reviewed by me and considered in my medical decision making (see chart for details).   She presents to the emergency department with elevated blood pressure along with intermittent headaches.  No sudden onset, maximal intensity headache symptoms.  Patient has a normal neurological exam.  He has been off of his lisinopril for 1 year.  He will restart if prescribed today and follow with his PCP by phone.  I do not find any evidence of hypertensive emergency on exam.  Will obtain screening labs.  EKG with nonspecific ST changes but these are similar to his prior tracings. No CT at this time with mild symptoms and normal exam.   Lab work including troponin reviewed with no acute findings.  Patient blood pressure downtrending without intervention here.  Plan to restart his home lisinopril and have him follow with his primary care physician.  I listed the name of the primary care physician in the area should he be unable to schedule with his.  [facility] emergency department  return precautions.  ____________________________________________  FINAL CLINICAL IMPRESSION(S) / ED DIAGNOSES  Final diagnoses:  Acute non intractable tension-type headache  Essential hypertension     MEDICATIONS GIVEN DURING THIS VISIT:  Medications  acetaminophen (TYLENOL) tablet 1,000 mg (1,000 mg Oral Given 08/02/18 2026)     NEW OUTPATIENT MEDICATIONS STARTED DURING THIS VISIT:  Discharge Medication List as of 08/02/2018  9:19 PM    START taking these medications   Details  lisinopril (PRINIVIL,ZESTRIL) 10 MG tablet Take 1 tablet (10 mg total) by mouth daily for 30 days., Starting Tue 08/02/2018, Until Thu 09/01/2018, Print        Note:  This document was prepared using Dragon voice recognition software and may include unintentional dictation errors.  Alona BeneJoshua Bronc Brosseau, MD Emergency Medicine    Brittania Sudbeck, Arlyss RepressJoshua G, MD 08/02/18 2201

## 2019-04-06 ENCOUNTER — Emergency Department (HOSPITAL_BASED_OUTPATIENT_CLINIC_OR_DEPARTMENT_OTHER): Payer: Self-pay

## 2019-04-06 ENCOUNTER — Emergency Department (HOSPITAL_BASED_OUTPATIENT_CLINIC_OR_DEPARTMENT_OTHER)
Admission: EM | Admit: 2019-04-06 | Discharge: 2019-04-06 | Disposition: A | Discharge status: Home / Self Care | Payer: Self-pay | Attending: Emergency Medicine | Admitting: Emergency Medicine

## 2019-04-06 ENCOUNTER — Other Ambulatory Visit: Payer: Self-pay

## 2019-04-06 DIAGNOSIS — I10 Essential (primary) hypertension: Secondary | ICD-10-CM | POA: Insufficient documentation

## 2019-04-06 DIAGNOSIS — Y999 Unspecified external cause status: Secondary | ICD-10-CM | POA: Insufficient documentation

## 2019-04-06 DIAGNOSIS — Y9389 Activity, other specified: Secondary | ICD-10-CM | POA: Insufficient documentation

## 2019-04-06 DIAGNOSIS — M84375A Stress fracture, left foot, initial encounter for fracture: Secondary | ICD-10-CM | POA: Insufficient documentation

## 2019-04-06 DIAGNOSIS — F17210 Nicotine dependence, cigarettes, uncomplicated: Secondary | ICD-10-CM | POA: Insufficient documentation

## 2019-04-06 DIAGNOSIS — X503XXA Overexertion from repetitive movements, initial encounter: Secondary | ICD-10-CM | POA: Insufficient documentation

## 2019-04-06 DIAGNOSIS — Y929 Unspecified place or not applicable: Secondary | ICD-10-CM | POA: Insufficient documentation

## 2019-04-06 NOTE — Discharge Instructions (Signed)
Call to schedule a follow up appointment in the coming week.

## 2019-04-06 NOTE — ED Provider Notes (Signed)
Emergency Department Provider Note   I have reviewed the triage vital signs and the nursing notes.   HISTORY  Chief Complaint No chief complaint on file.   HPI Leeam Cedrone is a 53 y.o. male presents to the emergency department for evaluation of left foot pain.  Symptoms have been worsening over the past several days.  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  He describes pain with walking.  Denies any numbness or tingling.  No pain in the ankle or knee.  No fevers or chills.  No joint redness.  No radiation of symptoms or other modifying factors.    Past Medical History:  Diagnosis Date  . Hx of migraines   . Hypertension     There are no problems to display for this patient.   No past surgical history on file.  Allergies Patient has no known allergies.  Family History  Problem Relation Age of Onset  . Hypertension Mother   . Heart attack Mother   . Cancer Father   . Hypertension Father     Social History Social History   Tobacco Use  . Smoking status: Current Every Day Smoker    Packs/day: 1.00    Types: Cigarettes  . Smokeless tobacco: Never Used  Substance Use Topics  . Alcohol use: Yes    Alcohol/week: 8.0 standard drinks    Types: 8 Cans of beer per week    Comment: occ  . Drug use: Not Currently    Review of Systems  Constitutional: No fever/chills Musculoskeletal: Positive left foot pain.  Skin: Negative for rash. Neurological: Negative for focal weakness or numbness.  10-point ROS otherwise negative.  ____________________________________________   PHYSICAL EXAM:  VITAL SIGNS: ED Triage Vitals  Enc Vitals Group     BP 04/06/19 1559 (!) 192/128     Pulse Rate 04/06/19 1559 89     Resp 04/06/19 1559 17     Temp 04/06/19 1559 99.7 F (37.6 C)     Temp Source 04/06/19 1559 Oral     SpO2 04/06/19 1559 98 %     Weight 04/06/19 1600 185 lb (83.9 kg)     Height 04/06/19 1600 5' 10.5" (1.791 m)     Constitutional: Alert and oriented. Well appearing and in no acute distress. Eyes: Conjunctivae are normal.  Head: Atraumatic. Neck: No stridor.  Cardiovascular: Good peripheral circulation. Respiratory: Normal respiratory effort.  Gastrointestinal: No distention.  Musculoskeletal: Tenderness to the heel of the left foot.  No joint swelling or redness.  No deformity. Neurologic:  Normal speech and language.  Skin:  Skin is warm, dry and intact. No rash noted.   ____________________________________________  RADIOLOGY  DG Foot Complete Left  Result Date: 04/06/2019 CLINICAL DATA:  Chronic left heel pain, no known injury EXAM: LEFT FOOT - COMPLETE 3+ VIEW COMPARISON:  None. FINDINGS: No definite fracture or dislocation of the left foot. There is very subtle linear sclerosis at the plantar aspect of the calcaneal tuberosity seen on lateral view, suspicious for stress fracture. Additional transverse linear sclerosis of the proximal left fourth metatarsal seen on AP view, suspicious for sequela of stress fracture. The joint spaces are well preserved. IMPRESSION: 1. Subtle linear sclerosis at the plantar aspect of the calcaneal tuberosity seen on lateral view, suspicious for stress fracture. MRI may be used to evaluate for marrow edema if desired, as well as other potential etiology of heel pain such as plantar fasciitis or Achilles  pathology. 2. Additional transverse linear sclerosis of the proximal left fourth metatarsal, likewise suspicious for sequela of stress fracture. Electronically Signed   By: Eddie Candle M.D.   On: 04/06/2019 16:43    ____________________________________________   PROCEDURES  Procedure(s) performed:   Procedures  None ____________________________________________   INITIAL IMPRESSION / ASSESSMENT AND PLAN / ED COURSE  Pertinent labs & imaging results that were available during my care of the patient were reviewed by me and considered in my medical decision  making (see chart for details).   Patient presents to the emergency department with left foot pain.  X-ray shows possible hairline fracture.  Provided a cam walker boot and will refer to sports medicine for follow-up.  Provided work note.  Discussed over-the-counter pain medication use for pain.    ____________________________________________  FINAL CLINICAL IMPRESSION(S) / ED DIAGNOSES  Final diagnoses:  Stress fracture of left foot, initial encounter    Note:  This document was prepared using Dragon voice recognition software and may include unintentional dictation errors.  Nanda Quinton, MD, [facility] Emergency Medicine    Rhondalyn Clingan, Wonda Olds, MD 04/07/19 1258

## 2019-04-06 NOTE — ED Triage Notes (Signed)
Fork lift driver foot strained x 2 weeks, Left heel of foot that hurts more in AM with burning sensation

## 2019-04-19 ENCOUNTER — Other Ambulatory Visit: Payer: Self-pay

## 2019-04-19 ENCOUNTER — Emergency Department (HOSPITAL_BASED_OUTPATIENT_CLINIC_OR_DEPARTMENT_OTHER)
Admission: EM | Admit: 2019-04-19 | Discharge: 2019-04-19 | Disposition: A | Discharge status: Home / Self Care | Payer: Self-pay | Attending: Emergency Medicine | Admitting: Emergency Medicine

## 2019-04-19 ENCOUNTER — Encounter (HOSPITAL_BASED_OUTPATIENT_CLINIC_OR_DEPARTMENT_OTHER): Payer: Self-pay | Admitting: *Deleted

## 2019-04-19 DIAGNOSIS — F17210 Nicotine dependence, cigarettes, uncomplicated: Secondary | ICD-10-CM | POA: Insufficient documentation

## 2019-04-19 DIAGNOSIS — M79672 Pain in left foot: Secondary | ICD-10-CM

## 2019-04-19 DIAGNOSIS — Z79899 Other long term (current) drug therapy: Secondary | ICD-10-CM | POA: Insufficient documentation

## 2019-04-19 DIAGNOSIS — I10 Essential (primary) hypertension: Secondary | ICD-10-CM | POA: Insufficient documentation

## 2019-04-19 NOTE — ED Triage Notes (Signed)
Pt was at [facility] ed for foot FX 12/15, c/o pain , as NOT f/u with ortho

## 2019-04-19 NOTE — ED Provider Notes (Signed)
[facility] EMERGENCY DEPARTMENT Provider Note   CSN: [id number] Arrival date & time: 04/19/19  1520     History Chief Complaint  Patient presents with  . Foot Pain    Lawrence Pope is a 53 y.o. male presents to the ER for evaluation of ongoing left heel pain onset approximately 2 weeks ago.  He presented to the ER for left foot pain.  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  He had x-rays and was told that he had a broken foot and to follow-up with orthopedist but states he has not been able to follow-up.  He has an appointment either Monday or Tuesday of this coming week.  Admits to occasionally wearing his cam walker but not all the time especially at work.  His pain is moderate, intermittent and usually with weightbearing and walking otherwise has no significant pain.  Denies any other reinjury.  No distal tingling or loss of sensation.  No joint or extremity swelling, redness or warmth.  Has been alternating Aleve and acetaminophen.  He is hoping to get something stronger for the pain.  HPI     Past Medical History:  Diagnosis Date  . Hx of migraines   . Hypertension     There are no problems to display for this patient.   History reviewed. No pertinent surgical history.     Family History  Problem Relation Age of Onset  . Hypertension Mother   . Heart attack Mother   . Cancer Father   . Hypertension Father     Social History   Tobacco Use  . Smoking status: Current Every Day Smoker    Packs/day: 1.00    Types: Cigarettes  . Smokeless tobacco: Never Used  Substance Use Topics  . Alcohol use: Yes    Alcohol/week: 8.0 standard drinks    Types: 8 Cans of beer per week    Comment: occ  . Drug use: Not Currently    Home Medications Prior to Admission medications   Medication Sig Start Date End Date Taking? Authorizing Provider  hydrochlorothiazide (HYDRODIURIL) 25 MG tablet Take 1 tablet (25 mg  total) by mouth daily. 09/08/16   Naida Sleight, PA-C  levofloxacin (LEVAQUIN) 500 MG tablet Take 1 tablet (500 mg total) by mouth daily. 10/04/16   Maczis, Elmer Sow, PA-C  lisinopril (PRINIVIL,ZESTRIL) 10 MG tablet Take 1 tablet (10 mg total) by mouth daily for 30 days. 08/02/18 09/01/18  Long, Arlyss Repress, MD  ondansetron (ZOFRAN ODT) 4 MG disintegrating tablet Take 1 tablet (4 mg total) by mouth every 8 (eight) hours as needed for nausea or vomiting. Patient not taking: Reported on 10/04/2016 10/04/16   Pricilla Loveless, MD  ondansetron (ZOFRAN) 4 MG tablet Take 1 tablet (4 mg total) by mouth every 8 (eight) hours as needed for nausea or vomiting. 10/04/16   Maczis, Elmer Sow, PA-C  potassium chloride SA (K-DUR,KLOR-CON) 20 MEQ tablet Take 1 tablet (20 mEq total) by mouth daily. Patient not taking: Reported on 10/04/2016 10/04/16   Pricilla Loveless, MD  promethazine (PHENERGAN) 25 MG tablet Take 1 tablet (25 mg total) by mouth every 6 (six) hours as needed for nausea or vomiting. Patient not taking: Reported on 10/04/2016 10/04/16   Pricilla Loveless, MD    Allergies    Patient has no known allergies.  Review of Systems   Review of Systems  Musculoskeletal: Positive for arthralgias and gait problem.  All other  systems reviewed and are negative.   Physical Exam Updated Vital Signs BP (!) 198/130   Pulse 94   Temp 98.7 F (37.1 C)   Resp (!) 118   Ht 5\' 10"  (1.778 m)   Wt 83 kg   SpO2 99%   BMI 26.26 kg/m   Physical Exam Constitutional:      Appearance: He is well-developed.  HENT:     Head: Normocephalic.     Nose: Nose normal.  Eyes:     General: Lids are normal.  Cardiovascular:     Rate and Rhythm: Normal rate.     Comments: 1+ DP and PT pulses in the left lower extremity.  No calf tenderness or swelling. Pulmonary:     Effort: Pulmonary effort is normal. No respiratory distress.  Musculoskeletal:        General: Tenderness present. Normal range of motion.     Cervical back:  Normal range of motion.     Comments: Focal tenderness at the base/insertion of plantar fascia/calcaneus.  Pain with great toe extension and dorsiflexion.  No other focal bony tenderness to malleoli, metatarsals, distal tip/fib.  No pain with plantarflexion, inversion or eversion.  Neurological:     Mental Status: He is alert.     Comments: Sensation and strength intact in left lower extremity and great toe  Psychiatric:        Behavior: Behavior normal.     ED Results / Procedures / Treatments   Labs (all labs ordered are listed, but only abnormal results are displayed) Labs Reviewed - No data to display  EKG None  Radiology No results found.  Procedures Procedures (including critical care time)  Medications Ordered in ED Medications - No data to display  ED Course  I have reviewed the triage vital signs and the nursing notes.  Pertinent labs & imaging results that were available during my care of the patient were reviewed by me and considered in my medical decision making (see chart for details).    MDM Rules/Calculators/A&P                      EMR reviewed.  ER visit on 12/10 reviewed.  Left foot x-ray at that time showed no definitive fracture dislocation but very subtle linear irregularity at the plantar aspect of the calcaneus tuberosity suggestive of stress fracture.  Other possibilities include soft tissue pathology like plantar fasciitis or Achilles pathology.  Exam today shows focal tenderness at insertion of plantar fascia and calcaneus tuberosity.  Patient has been partially compliant with cam walker at work likely contributing to his ongoing pain.  Denies any recurrent trauma.  Extremities neurovascularly intact.  No signs of infectious process, DVT.  I do not think emergent repeat imaging is indicated today.  He has appointment with orthopedist next week.  Encouraged high-dose NSAIDs for pain, compliance with cam walker, ice.  Will not prescribe narcotic pain  medicine here.  He understands.  Return precautions given.  Final Clinical Impression(s) / ED Diagnoses Final diagnoses:  Left foot pain    Rx / DC Orders ED Discharge Orders    None       Kinnie Feil, PA-C 04/19/19 [place], Adam, DO 04/19/19 1750

## 2019-04-19 NOTE — Discharge Instructions (Addendum)
You were seen in the ER for foot pain  We reviewed your x-rays - there was a questionable linear irregularity at the base of your heel bone.  It is unclear at this time if it is a fracture or not.  Soft tissue injuries like plantar fasciitis can also cause the pain you are having.  Given uncertainty we will continue to treat you as if you have a stress fracture.   You should wear your cam walker at all times  For pain and inflammation you can use a combination of ibuprofen and acetaminophen.  Take [phone] mg acetaminophen (tylenol) every 6 hours or 600 mg ibuprofen (advil, motrin) every 6 hours.  You can take these separately or combine them every 6 hours for maximum pain control. Do not exceed 4,000 mg acetaminophen or 2,400 mg ibuprofen in a 24 hour period.  Do not take ibuprofen containing products if you have history of kidney disease, ulcers, GI bleeding, severe acid reflux, or take a blood thinner.  Do not take acetaminophen if you have liver disease.   Follow-up with the orthopedic doctor next week for further discussion of your x-rays, at that time they may recommend repeating x-rays or obtaining another different imaging test.

## 2019-05-02 ENCOUNTER — Encounter: Payer: Self-pay | Admitting: Family Medicine

## 2019-05-02 ENCOUNTER — Other Ambulatory Visit: Payer: Self-pay

## 2019-05-02 ENCOUNTER — Ambulatory Visit: Payer: Self-pay

## 2019-05-02 ENCOUNTER — Ambulatory Visit (INDEPENDENT_AMBULATORY_CARE_PROVIDER_SITE_OTHER): Payer: Self-pay | Admitting: Family Medicine

## 2019-05-02 VITALS — HR 83 | Ht 71.0 in | Wt 185.0 lb

## 2019-05-02 DIAGNOSIS — M722 Plantar fascial fibromatosis: Secondary | ICD-10-CM

## 2019-05-02 DIAGNOSIS — M79672 Pain in left foot: Secondary | ICD-10-CM

## 2019-05-02 MED ORDER — PREDNISONE 5 MG PO TABS: ORAL_TABLET | ORAL | 0 refills | Status: DC

## 2019-05-02 NOTE — Progress Notes (Signed)
Lawrence Pope - 54 y.o. male MRN [id number]  Date of birth: Nov 27, 1965  SUBJECTIVE:  Including CC & ROS.  Chief Complaint  Patient presents with  . Foot Pain    left foot    Lawrence Pope is a 54 y.o. male that is presenting with left foot pain.  The pain is been ongoing for 4 to 5 months.  He does use a cam walker intermittently.  He has been seen in the emergency department a few different times.  The pain is localized to the plantar aspect of the calcaneus.  Seems to be worse with ambulation.  No specific inciting event.  Pain can be severe.  Has not tried injections previously.  Limited improvement with modalities to date.  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  Independent review of the left foot x-ray from 12/10 shows likely heel spur in the beginning stages and a possible healed fourth metatarsal stress fracture.   Review of Systems See HPI  HISTORY: Past Medical, Surgical, Social, and Family History Reviewed & Updated per EMR.   Pertinent Historical Findings include:  Past Medical History:  Diagnosis Date  . Hx of migraines   . Hypertension     No past surgical history on file.  No Known Allergies  Family History  Problem Relation Age of Onset  . Hypertension Mother   . Heart attack Mother   . Cancer Father   . Hypertension Father      Social History   Socioeconomic History  . Marital status: Legally Separated    Spouse name: Not on file  . Number of children: Not on file  . Years of education: Not on file  . Highest education level: Not on file  Occupational History  . Not on file  Tobacco Use  . Smoking status: Current Every Day Smoker    Packs/day: 1.00    Types: Cigarettes  . Smokeless tobacco: Never Used  Substance and Sexual Activity  . Alcohol use: Yes    Alcohol/week: 8.0 standard drinks    Types: 8 Cans of beer per week    Comment: occ  . Drug use: Not Currently  . Sexual activity: Not on file    Other Topics Concern  . Not on file  Social History Narrative  . Not on file   Social Determinants of Health   Financial Resource Strain:   . Difficulty of Paying Living Expenses: Not on file  Food Insecurity:   . Worried About Charity fundraiser in the Last Year: Not on file  . Ran Out of Food in the Last Year: Not on file  Transportation Needs:   . Lack of Transportation (Medical): Not on file  . Lack of Transportation (Non-Medical): Not on file  Physical Activity:   . Days of Exercise per Week: Not on file  . Minutes of Exercise per Session: Not on file  Stress:   . Feeling of Stress : Not on file  Social Connections:   . Frequency of Communication with Friends and Family: Not on file  . Frequency of Social Gatherings with Friends and Family: Not on file  . Attends Religious Services: Not on file  . Active Member of Clubs or Organizations: Not on file  . Attends Archivist Meetings: Not on file  . Marital Status: Not on file  Intimate Partner Violence:   . Fear of Current or Ex-Partner: Not on file  . Emotionally Abused: Not  on file  . Physically Abused: Not on file  . Sexually Abused: Not on file     PHYSICAL EXAM:  VS: Pulse 83   Ht 5\' 11"  (1.803 m)   Wt 185 lb (83.9 kg)   BMI 25.80 kg/m  Physical Exam Gen: NAD, alert, cooperative with exam, well-appearing ENT: normal lips, normal nasal mucosa,  Eye: normal EOM, normal conjunctiva and lids CV:  no edema, +2 pedal pulses   Resp: no accessory muscle use, non-labored,  Skin: no rashes, no areas of induration  Neuro: normal tone, normal sensation to touch Psych:  normal insight, alert and oriented MSK:  Left foot: Tenderness palpation of the plantar calcaneus. No ecchymosis or swelling. Normal ankle range of motion. Some pronated changes of the foot. Normal strength resistance. Neurovascularly intact   Limited ultrasound: Left heel:  Heel spur noted with no significant hyperemia. No  specific effusion noted of the calcaneus. Normal insertion of the Achilles. Hypoechoic changes superficial to the plantar fascia.  Mild hyperemia occurring at the origin of the plantar fascia.  No significant thickness of the plantar fascia.  Summary: Findings suggestive of plantar fasciitis  Ultrasound and interpretation by , MD      ASSESSMENT & PLAN:   Plantar fasciitis of left foot Seems less likely for fracture and no tenderness over the shaft of the fourth metatarsal as seen on x-ray.  Symptoms seem more suggestive of plantar fasciitis. -Prednisone. -Midfoot arch strap. -Counseled on home exercise therapy and supportive care. -Can discontinue the cam walker. -Provided work note. -Could consider injection at follow-up

## 2019-05-02 NOTE — Patient Instructions (Signed)
Nice to meet you Please try ice  Please try the arch strap  Please try the exercises  Please try to not use the put  Please try to stretch and ice during work   Please send me a message in MyChart with any questions or updates.  Please see me back in 4 weeks or sooner if needed.   --Dr. Jordan Likes

## 2019-05-03 DIAGNOSIS — M722 Plantar fascial fibromatosis: Secondary | ICD-10-CM | POA: Insufficient documentation

## 2019-05-03 NOTE — Assessment & Plan Note (Signed)
Seems less likely for fracture and no tenderness over the shaft of the fourth metatarsal as seen on x-ray.  Symptoms seem more suggestive of plantar fasciitis. -Prednisone. -Midfoot arch strap. -Counseled on home exercise therapy and supportive care. -Can discontinue the cam walker. -Provided work note. -Could consider injection at follow-up

## 2019-05-30 ENCOUNTER — Ambulatory Visit: Payer: Self-pay | Admitting: Family Medicine

## 2019-05-30 NOTE — Progress Notes (Deleted)
  Hanna Aultman - 54 y.o. male MRN [id number]  Date of birth: 1966-02-26  SUBJECTIVE:  Including CC & ROS.  No chief complaint on file.   Kenny Rea is a 54 y.o. male that is  ***.  ***   Review of Systems See HPI   HISTORY: Past Medical, Surgical, Social, and Family History Reviewed & Updated per EMR.   Pertinent Historical Findings include:  Past Medical History:  Diagnosis Date  . Hx of migraines   . Hypertension     No past surgical history on file.  No Known Allergies  Family History  Problem Relation Age of Onset  . Hypertension Mother   . Heart attack Mother   . Cancer Father   . Hypertension Father      Social History   Socioeconomic History  . Marital status: Legally Separated    Spouse name: Not on file  . Number of children: Not on file  . Years of education: Not on file  . Highest education level: Not on file  Occupational History  . Not on file  Tobacco Use  . Smoking status: Current Every Day Smoker    Packs/day: 1.00    Types: Cigarettes  . Smokeless tobacco: Never Used  Substance and Sexual Activity  . Alcohol use: Yes    Alcohol/week: 8.0 standard drinks    Types: 8 Cans of beer per week    Comment: occ  . Drug use: Not Currently  . Sexual activity: Not on file  Other Topics Concern  . Not on file  Social History Narrative  . Not on file   Social Determinants of Health   Financial Resource Strain:   . Difficulty of Paying Living Expenses: Not on file  Food Insecurity:   . Worried About Programme researcher, broadcasting/film/video in the Last Year: Not on file  . Ran Out of Food in the Last Year: Not on file  Transportation Needs:   . Lack of Transportation (Medical): Not on file  . Lack of Transportation (Non-Medical): Not on file  Physical Activity:   . Days of Exercise per Week: Not on file  . Minutes of Exercise per Session: Not on file  Stress:   . Feeling of Stress : Not on file  Social Connections:   . Frequency of Communication with  Friends and Family: Not on file  . Frequency of Social Gatherings with Friends and Family: Not on file  . Attends Religious Services: Not on file  . Active Member of Clubs or Organizations: Not on file  . Attends Banker Meetings: Not on file  . Marital Status: Not on file  Intimate Partner Violence:   . Fear of Current or Ex-Partner: Not on file  . Emotionally Abused: Not on file  . Physically Abused: Not on file  . Sexually Abused: Not on file     PHYSICAL EXAM:  VS: There were no vitals taken for this visit. Physical Exam Gen: NAD, alert, cooperative with exam, well-appearing ENT: normal lips, normal nasal mucosa,  Eye: normal EOM, normal conjunctiva and lids Skin: no rashes, no areas of induration  Neuro: normal tone, normal sensation to touch Psych:  normal insight, alert and oriented MSK:  ***      ASSESSMENT & PLAN:   No problem-specific Assessment & Plan notes found for this encounter.

## 2019-07-10 ENCOUNTER — Other Ambulatory Visit: Payer: Self-pay

## 2019-07-10 ENCOUNTER — Emergency Department (HOSPITAL_BASED_OUTPATIENT_CLINIC_OR_DEPARTMENT_OTHER)
Admission: EM | Admit: 2019-07-10 | Discharge: 2019-07-10 | Disposition: A | Discharge status: Home / Self Care | Payer: Self-pay | Attending: Emergency Medicine | Admitting: Emergency Medicine

## 2019-07-10 ENCOUNTER — Emergency Department (HOSPITAL_BASED_OUTPATIENT_CLINIC_OR_DEPARTMENT_OTHER): Payer: Worker's Compensation

## 2019-07-10 ENCOUNTER — Encounter (HOSPITAL_BASED_OUTPATIENT_CLINIC_OR_DEPARTMENT_OTHER): Payer: Self-pay | Admitting: *Deleted

## 2019-07-10 DIAGNOSIS — I10 Essential (primary) hypertension: Secondary | ICD-10-CM | POA: Insufficient documentation

## 2019-07-10 DIAGNOSIS — T1490XA Injury, unspecified, initial encounter: Secondary | ICD-10-CM

## 2019-07-10 DIAGNOSIS — F17210 Nicotine dependence, cigarettes, uncomplicated: Secondary | ICD-10-CM | POA: Insufficient documentation

## 2019-07-10 DIAGNOSIS — Y990 Civilian activity done for income or pay: Secondary | ICD-10-CM | POA: Insufficient documentation

## 2019-07-10 DIAGNOSIS — W230XXA Caught, crushed, jammed, or pinched between moving objects, initial encounter: Secondary | ICD-10-CM | POA: Insufficient documentation

## 2019-07-10 DIAGNOSIS — Y939 Activity, unspecified: Secondary | ICD-10-CM | POA: Insufficient documentation

## 2019-07-10 DIAGNOSIS — S99922A Unspecified injury of left foot, initial encounter: Secondary | ICD-10-CM | POA: Insufficient documentation

## 2019-07-10 DIAGNOSIS — Z79899 Other long term (current) drug therapy: Secondary | ICD-10-CM | POA: Insufficient documentation

## 2019-07-10 DIAGNOSIS — Y929 Unspecified place or not applicable: Secondary | ICD-10-CM | POA: Insufficient documentation
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
## 2019-07-10 NOTE — Discharge Instructions (Signed)
You have been seen today for a foot injury. There were no acute abnormalities on the x-rays, including no sign of fracture or dislocation, however, there could be injuries to the soft tissues, such as the ligaments or tendons that are not seen on xrays. There could also be what are called occult fractures that are small fractures not seen on xray. Antiinflammatory medications: Take 600 mg of ibuprofen every 6 hours or 440 mg (over the counter dose) to 500 mg (prescription dose) of naproxen every 12 hours for the next 3 days. After this time, these medications may be used as needed for pain. Take these medications with food to avoid upset stomach. Choose only one of these medications, do not take them together. Acetaminophen (generic for Tylenol): Should you continue to have additional pain while taking the ibuprofen or naproxen, you may add in acetaminophen as needed. Your daily total maximum amount of acetaminophen from all sources should be limited to 4000mg /day for persons without liver problems, or 2000mg /day for those with liver problems. Ice: May apply ice to the area over the next 24 hours for 15 minutes at a time to reduce swelling. Elevation: Keep the extremity elevated as often as possible to reduce pain and inflammation. Follow up: If symptoms are improving, you may follow up with your primary care provider for any continued management. If symptoms are not starting to improve within a week, you should follow up with the orthopedic specialist within two weeks. Return: Return to the ED for numbness, weakness, increasing pain, overall worsening symptoms, loss of function, or if symptoms are not improving, you have tried to follow up with the orthopedic specialist, and have been unable to do so.  For prescription assistance, may try using prescription discount sites or apps, such as goodrx.com  Your blood pressure was higher than normal today. Please have this reevaluated by a primary care  provider.

## 2020-01-10 ENCOUNTER — Other Ambulatory Visit: Payer: Self-pay

## 2020-01-10 ENCOUNTER — Encounter (HOSPITAL_BASED_OUTPATIENT_CLINIC_OR_DEPARTMENT_OTHER): Payer: Self-pay

## 2020-01-10 ENCOUNTER — Emergency Department (HOSPITAL_BASED_OUTPATIENT_CLINIC_OR_DEPARTMENT_OTHER)
Admission: EM | Admit: 2020-01-10 | Discharge: 2020-01-10 | Disposition: A | Discharge status: Home / Self Care | Payer: Self-pay | Attending: Emergency Medicine | Admitting: Emergency Medicine

## 2020-01-10 DIAGNOSIS — Z760 Encounter for issue of repeat prescription: Secondary | ICD-10-CM | POA: Insufficient documentation

## 2020-01-10 DIAGNOSIS — Z5321 Procedure and treatment not carried out due to patient leaving prior to being seen by health care provider: Secondary | ICD-10-CM | POA: Insufficient documentation

## 2020-01-10 NOTE — ED Triage Notes (Signed)
Pt requesting BP med refill-NAD-steady gait 

## 2020-09-06 ENCOUNTER — Emergency Department
Admission: EM | Admit: 2020-09-06 | Discharge: 2020-09-06 | Disposition: A | Discharge status: Home / Self Care | Payer: Self-pay | Attending: Emergency Medicine | Admitting: Emergency Medicine

## 2020-09-06 ENCOUNTER — Other Ambulatory Visit: Payer: Self-pay

## 2020-09-06 DIAGNOSIS — R300 Dysuria: Secondary | ICD-10-CM | POA: Insufficient documentation

## 2020-09-06 DIAGNOSIS — F17210 Nicotine dependence, cigarettes, uncomplicated: Secondary | ICD-10-CM | POA: Insufficient documentation

## 2020-09-06 DIAGNOSIS — Z202 Contact with and (suspected) exposure to infections with a predominantly sexual mode of transmission: Secondary | ICD-10-CM

## 2020-09-06 DIAGNOSIS — I10 Essential (primary) hypertension: Secondary | ICD-10-CM | POA: Insufficient documentation

## 2020-09-06 DIAGNOSIS — Z79899 Other long term (current) drug therapy: Secondary | ICD-10-CM | POA: Insufficient documentation

## 2020-09-06 DIAGNOSIS — R369 Urethral discharge, unspecified: Secondary | ICD-10-CM | POA: Insufficient documentation

## 2020-09-06 LAB — CHLAMYDIA/NGC RT PCR (ARMC ONLY)
Chlamydia Tr: DETECTED — AB
N gonorrhoeae: NOT DETECTED

## 2020-09-06 LAB — URINALYSIS, COMPLETE (UACMP) WITH MICROSCOPIC
Bacteria, UA: NONE SEEN
Bilirubin Urine: NEGATIVE
Glucose, UA: NEGATIVE mg/dL
Ketones, ur: NEGATIVE mg/dL
Nitrite: NEGATIVE
Protein, ur: NEGATIVE mg/dL
Specific Gravity, Urine: 1.02 (ref 1.005–1.030)
Squamous Epithelial / HPF: NONE SEEN (ref 0–5)
pH: 6 (ref 5.0–8.0)

## 2020-09-06 MED ORDER — DOXYCYCLINE HYCLATE 100 MG PO TABS
100.0000 mg | ORAL_TABLET | Freq: Once | ORAL | Status: AC
  Administered 2020-09-06: 100 mg via ORAL
  Filled 2020-09-06: qty 1

## 2020-09-06 MED ORDER — DOXYCYCLINE HYCLATE 100 MG PO TABS: 100.0000 mg | ORAL_TABLET | Freq: Two times a day (BID) | ORAL | 0 refills | Status: DC

## 2020-09-06 MED ORDER — CEFTRIAXONE SODIUM 1 G IJ SOLR
500.0000 mg | Freq: Once | INTRAMUSCULAR | Status: AC
  Administered 2020-09-06: 500 mg via INTRAMUSCULAR
  Filled 2020-09-06: qty 10

## 2020-09-06 NOTE — ED Notes (Signed)
See triage note  States he has a penile discharge and some burning with urination

## 2020-09-06 NOTE — ED Triage Notes (Addendum)
Pt states that he thinks he may have something and need a shot, pt states he has had burning for a week with some drainage. Pt states that he has been out of his bp meds for a month
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
## 2020-09-09 ENCOUNTER — Telehealth: Payer: Self-pay | Admitting: Emergency Medicine
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
## 2020-11-10 ENCOUNTER — Other Ambulatory Visit: Payer: Self-pay

## 2020-11-10 ENCOUNTER — Encounter (HOSPITAL_BASED_OUTPATIENT_CLINIC_OR_DEPARTMENT_OTHER): Payer: Self-pay | Admitting: Emergency Medicine

## 2020-11-10 ENCOUNTER — Emergency Department (HOSPITAL_BASED_OUTPATIENT_CLINIC_OR_DEPARTMENT_OTHER)
Admission: EM | Admit: 2020-11-10 | Discharge: 2020-11-10 | Disposition: A | Discharge status: Home / Self Care | Payer: Self-pay | Attending: Emergency Medicine | Admitting: Emergency Medicine

## 2020-11-10 DIAGNOSIS — Z79899 Other long term (current) drug therapy: Secondary | ICD-10-CM | POA: Insufficient documentation

## 2020-11-10 DIAGNOSIS — F17210 Nicotine dependence, cigarettes, uncomplicated: Secondary | ICD-10-CM | POA: Insufficient documentation

## 2020-11-10 DIAGNOSIS — X04XXXA Exposure to ignition of highly flammable material, initial encounter: Secondary | ICD-10-CM | POA: Insufficient documentation

## 2020-11-10 DIAGNOSIS — I10 Essential (primary) hypertension: Secondary | ICD-10-CM | POA: Insufficient documentation

## 2020-11-10 DIAGNOSIS — T24212A Burn of second degree of left thigh, initial encounter: Secondary | ICD-10-CM | POA: Insufficient documentation

## 2020-11-10 DIAGNOSIS — T25621A Corrosion of second degree of right foot, initial encounter: Secondary | ICD-10-CM

## 2020-11-10 MED ORDER — CEPHALEXIN 500 MG PO CAPS: 500.0000 mg | ORAL_CAPSULE | Freq: Four times a day (QID) | ORAL | 0 refills | Status: DC
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
## 2020-11-10 NOTE — Discharge Instructions (Addendum)
You can put antibiotic ointment on this couple times a day.  Please return for redness drainage or fever.  I have given you information for a foot doctor if you would like to follow-up with them in the office.

## 2020-11-10 NOTE — ED Provider Notes (Signed)
[facility] EMERGENCY DEPARTMENT Provider Note   CSN: [id number] Arrival date & time: 11/10/20  2052     History Chief Complaint  Patient presents with   Chemical Exposure    Lawrence Pope is a 55 y.o. male.  55 yo M with a chief complaints of burn to the right foot.  Patient states that while he was at work about 4 days ago he had a chemical spilled on his foot and was at 8 through his shoe and his sock got onto his foot.  Noticed it was burning when he got home he rinsed the area and applied hydrogen peroxide.  Since then he has continued to have a break in the skin there.  Denies fevers or chills denies drainage.  He has the MSDS form for the chemical appears to be a commercial detergent.  The history is provided by the patient.  Injury This is a new problem. The current episode started more than 2 days ago. The problem occurs constantly. The problem has not changed since onset.Pertinent negatives include no chest pain, no abdominal pain, no headaches and no shortness of breath. Nothing aggravates the symptoms. Nothing relieves the symptoms. He has tried nothing for the symptoms. The treatment provided no relief.      Past Medical History:  Diagnosis Date   Hx of migraines    Hypertension     Patient Active Problem List   Diagnosis Date Noted   Plantar fasciitis of left foot 05/03/2019    History reviewed. No pertinent surgical history.     Family History  Problem Relation Age of Onset   Hypertension Mother    Heart attack Mother    Cancer Father    Hypertension Father     Social History   Tobacco Use   Smoking status: Every Day    Packs/day: 1.00    Types: Cigarettes   Smokeless tobacco: Never  Substance Use Topics   Alcohol use: Yes    Alcohol/week: 8.0 standard drinks    Types: 8 Cans of beer per week    Comment: occ   Drug use: Not Currently    Home Medications Prior to Admission medications   Medication Sig Start Date End Date  Taking? Authorizing Provider  cephALEXin (KEFLEX) 500 MG capsule Take 1 capsule (500 mg total) by mouth 4 (four) times daily. 11/10/20  Yes Melene Plan, DO  doxycycline (VIBRA-TABS) 100 MG tablet Take 1 tablet (100 mg total) by mouth 2 (two) times daily. 09/06/20   Cuthriell, Delorise Royals, PA-C  hydrochlorothiazide (HYDRODIURIL) 25 MG tablet Take 1 tablet (25 mg total) by mouth daily. 09/08/16   Naida Sleight, PA-C  levofloxacin (LEVAQUIN) 500 MG tablet Take 1 tablet (500 mg total) by mouth daily. 10/04/16   Maczis, Elmer Sow, PA-C  lisinopril (PRINIVIL,ZESTRIL) 10 MG tablet Take 1 tablet (10 mg total) by mouth daily for 30 days. 08/02/18 09/01/18  Long, Arlyss Repress, MD  ondansetron (ZOFRAN) 4 MG tablet Take 1 tablet (4 mg total) by mouth every 8 (eight) hours as needed for nausea or vomiting. 10/04/16   Maczis, Elmer Sow, PA-C    Allergies    Patient has no known allergies.  Review of Systems   Review of Systems  Constitutional:  Negative for chills and fever.  HENT:  Negative for congestion and facial swelling.   Eyes:  Negative for discharge and visual disturbance.  Respiratory:  Negative for shortness of breath.   Cardiovascular:  Negative for chest pain and  palpitations.  Gastrointestinal:  Negative for abdominal pain, diarrhea and vomiting.  Musculoskeletal:  Negative for arthralgias and myalgias.  Skin:  Positive for wound. Negative for color change and rash.  Neurological:  Negative for tremors, syncope and headaches.  Psychiatric/Behavioral:  Negative for confusion and dysphoric mood.    Physical Exam Updated Vital Signs BP (!) 182/106 (BP Location: Left Arm)   Pulse 90   Temp 98.3 F (36.8 C) (Oral)   Resp 20   Ht 5\' 11"  (1.803 m)   Wt 85.7 kg   SpO2 96%   BMI 26.35 kg/m   Physical Exam Vitals and nursing note reviewed.  Constitutional:      Appearance: He is well-developed.  HENT:     Head: Normocephalic and atraumatic.  Eyes:     Pupils: Pupils are equal, round, and  reactive to light.  Neck:     Vascular: No JVD.  Cardiovascular:     Rate and Rhythm: Normal rate and regular rhythm.     Heart sounds: No murmur heard.   No friction rub. No gallop.  Pulmonary:     Effort: No respiratory distress.     Breath sounds: No wheezing.  Abdominal:     General: There is no distension.     Tenderness: There is no abdominal tenderness. There is no guarding or rebound.  Musculoskeletal:        General: Normal range of motion.     Cervical back: Normal range of motion and neck supple.  Skin:    Coloration: Skin is not pale.     Findings: No rash.     Comments: Circular area of slight ulceration to the dorsal aspect of the right foot.  No surrounding erythema induration or fluctuance.  Dorsalis pedis pulse intact.  Neurological:     Mental Status: He is alert and oriented to person, place, and time.  Psychiatric:        Behavior: Behavior normal.    ED Results / Procedures / Treatments   Labs (all labs ordered are listed, but only abnormal results are displayed) Labs Reviewed - No data to display  EKG None  Radiology No results found.  Procedures Procedures   Medications Ordered in ED Medications - No data to display  ED Course  I have reviewed the triage vital signs and the nursing notes.  Pertinent labs & imaging results that were available during my care of the patient were reviewed by me and considered in my medical decision making (see chart for details).    MDM Rules/Calculators/A&P                          55 yo M with a chief complaints of a chemical burn.  This happened 4 days ago.  We will have him continue supportive care.  Given podiatry follow-up if needed.  9:52 PM:  I have discussed the diagnosis/risks/treatment options with the patient and believe the pt to be eligible for discharge home to follow-up with PCP. We also discussed returning to the ED immediately if new or worsening sx occur. We discussed the sx which are most  concerning (e.g., sudden worsening pain, fever, inability to tolerate by mouth) that necessitate immediate return. Medications administered to the patient during their visit and any new prescriptions provided to the patient are listed below.  Medications given during this visit Medications - No data to display   The patient appears reasonably screen and/or stabilized for discharge  and I doubt any other medical condition or other [facility] requiring further screening, evaluation, or treatment in the ED at this time prior to discharge.   Final Clinical Impression(s) / ED Diagnoses Final diagnoses:  Partial thickness chemical burn of right foot, initial encounter    Rx / DC Orders ED Discharge Orders          Ordered    cephALEXin (KEFLEX) 500 MG capsule  4 times daily        11/10/20 2147             Melene Plan, DO 11/10/20 2152

## 2020-12-09 ENCOUNTER — Emergency Department (HOSPITAL_BASED_OUTPATIENT_CLINIC_OR_DEPARTMENT_OTHER)
Admission: EM | Admit: 2020-12-09 | Discharge: 2020-12-09 | Disposition: A | Discharge status: Home / Self Care | Payer: Self-pay | Attending: Emergency Medicine | Admitting: Emergency Medicine

## 2020-12-09 ENCOUNTER — Emergency Department (HOSPITAL_BASED_OUTPATIENT_CLINIC_OR_DEPARTMENT_OTHER): Payer: Self-pay

## 2020-12-09 ENCOUNTER — Other Ambulatory Visit: Payer: Self-pay

## 2020-12-09 ENCOUNTER — Encounter (HOSPITAL_BASED_OUTPATIENT_CLINIC_OR_DEPARTMENT_OTHER): Payer: Self-pay | Admitting: *Deleted

## 2020-12-09 DIAGNOSIS — Z79899 Other long term (current) drug therapy: Secondary | ICD-10-CM | POA: Insufficient documentation

## 2020-12-09 DIAGNOSIS — I10 Essential (primary) hypertension: Secondary | ICD-10-CM | POA: Insufficient documentation

## 2020-12-09 DIAGNOSIS — F17210 Nicotine dependence, cigarettes, uncomplicated: Secondary | ICD-10-CM | POA: Insufficient documentation

## 2020-12-09 DIAGNOSIS — N451 Epididymitis: Secondary | ICD-10-CM | POA: Insufficient documentation

## 2020-12-09 LAB — URINALYSIS, ROUTINE W REFLEX MICROSCOPIC
Bilirubin Urine: NEGATIVE
Glucose, UA: NEGATIVE mg/dL
Ketones, ur: NEGATIVE mg/dL
Nitrite: NEGATIVE
Protein, ur: NEGATIVE mg/dL
Specific Gravity, Urine: 1.02 (ref 1.005–1.030)
pH: 6.5 (ref 5.0–8.0)

## 2020-12-09 LAB — URINALYSIS, MICROSCOPIC (REFLEX)

## 2020-12-09 MED ORDER — LIDOCAINE HCL (PF) 1 % IJ SOLN
5.0000 mL | Freq: Once | INTRAMUSCULAR | Status: AC
  Administered 2020-12-09: 1 mL
  Filled 2020-12-09: qty 5

## 2020-12-09 MED ORDER — HYDROCHLOROTHIAZIDE 25 MG PO TABS
25.0000 mg | ORAL_TABLET | Freq: Once | ORAL | Status: AC
  Administered 2020-12-09: 25 mg via ORAL
  Filled 2020-12-09: qty 1

## 2020-12-09 MED ORDER — OXYCODONE HCL 5 MG PO TABS: 5.0000 mg | ORAL_TABLET | Freq: Four times a day (QID) | ORAL | 0 refills | Status: DC | PRN

## 2020-12-09 MED ORDER — OXYCODONE HCL 5 MG PO TABS
10.0000 mg | ORAL_TABLET | Freq: Once | ORAL | Status: AC
  Administered 2020-12-09: 10 mg via ORAL
  Filled 2020-12-09: qty 2

## 2020-12-09 MED ORDER — LIDOCAINE-EPINEPHRINE 2 %-1:100000 IJ SOLN: 20.0000 mL | Freq: Once | INTRAMUSCULAR | Status: DC

## 2020-12-09 MED ORDER — CEFTRIAXONE SODIUM 500 MG IJ SOLR
500.0000 mg | Freq: Once | INTRAMUSCULAR | Status: AC
  Administered 2020-12-09: 500 mg via INTRAMUSCULAR
  Filled 2020-12-09: qty 500

## 2020-12-09 MED ORDER — DOXYCYCLINE HYCLATE 100 MG PO CAPS: 100.0000 mg | ORAL_CAPSULE | Freq: Two times a day (BID) | ORAL | 0 refills | Status: DC

## 2020-12-09 MED ORDER — AZITHROMYCIN 250 MG PO TABS
1000.0000 mg | ORAL_TABLET | Freq: Once | ORAL | Status: AC
  Administered 2020-12-09: 1000 mg via ORAL
  Filled 2020-12-09: qty 4

## 2020-12-09 NOTE — ED Provider Notes (Signed)
[facility] EMERGENCY DEPARTMENT Provider Note   CSN: [id number] Arrival date & time: 12/09/20  1823     History Chief Complaint  Patient presents with   Testicle Pain    Lawrence Pope is a 55 y.o. male.  Patient with history of hypertension presents emergency department for evaluation testicular/scrotal pain and swelling in the past 2 days.  No injuries.  Pain is gradually worsening.  Reports some mild pain in the lower back.  No chest pain, shortness of breath.  No abdominal pain.  No vomiting or diarrhea.  No history of kidney stones.  Denies dysuria, hematuria but does state he has some discharge at times.  Does report unprotected sexual intercourse, unknown if partners of had any infections or symptoms.  Blood pressure markedly elevated on arrival.  No treatments prior to arrival.      Past Medical History:  Diagnosis Date   Hx of migraines    Hypertension     Patient Active Problem List   Diagnosis Date Noted   Plantar fasciitis of left foot 05/03/2019    History reviewed. No pertinent surgical history.     Family History  Problem Relation Age of Onset   Hypertension Mother    Heart attack Mother    Cancer Father    Hypertension Father     Social History   Tobacco Use   Smoking status: Every Day    Packs/day: 1.00    Types: Cigarettes   Smokeless tobacco: Never  Substance Use Topics   Alcohol use: Yes    Alcohol/week: 8.0 standard drinks    Types: 8 Cans of beer per week    Comment: occ   Drug use: Not Currently    Home Medications Prior to Admission medications   Medication Sig Start Date End Date Taking? Authorizing Provider  cephALEXin (KEFLEX) 500 MG capsule Take 1 capsule (500 mg total) by mouth 4 (four) times daily. 11/10/20   Melene Plan, DO  doxycycline (VIBRA-TABS) 100 MG tablet Take 1 tablet (100 mg total) by mouth 2 (two) times daily. 09/06/20   Cuthriell, Delorise Royals, PA-C  hydrochlorothiazide (HYDRODIURIL) 25 MG tablet Take 1  tablet (25 mg total) by mouth daily. 09/08/16   Naida Sleight, PA-C  levofloxacin (LEVAQUIN) 500 MG tablet Take 1 tablet (500 mg total) by mouth daily. 10/04/16   Maczis, Elmer Sow, PA-C  lisinopril (PRINIVIL,ZESTRIL) 10 MG tablet Take 1 tablet (10 mg total) by mouth daily for 30 days. 08/02/18 09/01/18  Long, Arlyss Repress, MD  ondansetron (ZOFRAN) 4 MG tablet Take 1 tablet (4 mg total) by mouth every 8 (eight) hours as needed for nausea or vomiting. 10/04/16   Maczis, Elmer Sow, PA-C    Allergies    Patient has no known allergies.  Review of Systems   Review of Systems  Constitutional:  Negative for fever.  HENT:  Negative for rhinorrhea and sore throat.   Eyes:  Negative for redness.  Respiratory:  Negative for cough.   Cardiovascular:  Negative for chest pain.  Gastrointestinal:  Negative for abdominal pain, diarrhea, nausea and vomiting.  Genitourinary:  Positive for scrotal swelling and testicular pain. Negative for dysuria and hematuria.  Musculoskeletal:  Negative for myalgias.  Skin:  Negative for rash.  Neurological:  Negative for headaches.   Physical Exam Updated Vital Signs BP (!) 227/125   Pulse 65   Temp 99.3 F (37.4 C) (Oral)   Resp 16   Ht 5\' 10"  (1.778 m)   Wt  87.5 kg   SpO2 100%   BMI 27.69 kg/m   Physical Exam Vitals and nursing note reviewed.  Constitutional:      General: He is in acute distress (appears mildly uncomfortable).     Appearance: He is well-developed.  HENT:     Head: Normocephalic and atraumatic.  Eyes:     Conjunctiva/sclera: Conjunctivae normal.  Pulmonary:     Effort: No respiratory distress.  Genitourinary:    Penis: Normal.      Testes:        Right: Tenderness and swelling present.        Left: Tenderness and swelling present.     Comments: Scrotal swelling, R slightly greater than L.  Musculoskeletal:     Cervical back: Normal range of motion and neck supple.  Skin:    General: Skin is warm and dry.  Neurological:     Mental  Status: He is alert.    ED Results / Procedures / Treatments   Labs (all labs ordered are listed, but only abnormal results are displayed) Labs Reviewed  URINALYSIS, ROUTINE W REFLEX MICROSCOPIC - Abnormal; Notable for the following components:      Result Value   APPearance CLOUDY (*)    Hgb urine dipstick MODERATE (*)    Leukocytes,Ua SMALL (*)    All other components within normal limits  URINALYSIS, MICROSCOPIC (REFLEX) - Abnormal; Notable for the following components:   Bacteria, UA RARE (*)    All other components within normal limits  GC/CHLAMYDIA PROBE AMP () NOT AT [facility]    EKG None  Radiology US SCROTUM W/DOPPLER  Result Date: 12/09/2020 CLINICAL DATA:  Testicular pain. EXAM: SCROTAL ULTRASOUND DOPPLER ULTRASOUND OF THE TESTICLES TECHNIQUE: Complete ultrasound examination of the testicles, epididymis, and other scrotal structures was performed. Color and spectral Doppler ultrasound were also utilized to evaluate blood flow to the testicles. COMPARISON:  None. FINDINGS: Right testicle Measurements: 4.3 x 1.9 x 2.8 cm. No mass. Multiple microlithiasis visualized. Left testicle Measurements: 8.9 x 1.8 x 2.6 cm. No mass. Multiple microlithiasis visualized. Right epididymis:  Enlarged in size and hypervascular. Left epididymis: Epididymal head cyst measuring up to 0.4 cm. Otherwise normal in size and appearance. Hydrocele: Bilateral simple hydroceles small volume noted. Varicocele:  None visualized. Pulsed Doppler interrogation of both testes demonstrates normal low resistance arterial and venous waveforms bilaterally. IMPRESSION: 1. Right epididymitis.  No abscess formation. 2. Bilateral small simple hydroceles. 3. Current literature suggests that testicular microlithiasis is not a significant independent risk factor for development of testicular carcinoma, and that follow up imaging is not warranted in the absence of other risk factors. Monthly testicular self-examination  and annual physical exams are considered appropriate surveillance. If patient has other risk factors for testicular carcinoma, then referral to Urology should be considered. (Reference: DeCastro, et al.: A 5-Year Follow up Study of Asymptomatic Men with Testicular Microlithiasis. J Urol 2008; 179:1420-1423.) Electronically Signed   By: Tish Frederickson M.D.   On: 12/09/2020 20:05    Procedures Procedures   Medications Ordered in ED Medications  oxyCODONE (Oxy IR/ROXICODONE) immediate release tablet 10 mg (has no administration in time range)    ED Course  I have reviewed the triage vital signs and the nursing notes.  Pertinent labs & imaging results that were available during my care of the patient were reviewed by me and considered in my medical decision making (see chart for details).  Patient seen and examined. Work-up initiated. BP noted. Pain medication, UA  ordered.   Vital signs reviewed and are as follows: BP (!) 227/125   Pulse 65   Temp 99.3 F (37.4 C) (Oral)   Resp 16   Ht 5\' 10"  (1.778 m)   Wt 87.5 kg   SpO2 100%   BMI 27.69 kg/m   Ultrasound consistent with right-sided epididymitis.  Suspect that this is likely an STI infection given history reported.  Patient be treated with Rocephin and azithromycin here.  Will be given a prescription for doxycycline and short course of pain medication.  Will recheck blood pressure.  Patient's blood pressure is slightly improved.  He states that he has blood pressure at home; "I just need to take them".  Discussed importance of blood pressure control and need for follow-up.  Patient is anxious to leave stating he wants to get something to eat.  Will test and treat for STD exposure. Patient counseled on safe sexual practices. Told them that they should not have sexual contact for next 7 days and that they need to inform sexual partners so that they can get tested and treated as well. Patient verbalizes understanding and agrees with plan.        MDM Rules/Calculators/A&P                           Patient with epididymitis and high risk sexual contact.  Treatment as above.  Blood pressure also noted be elevated.  Patient has had a history of hypertension in the past.  He states that he has been noncompliant with blood pressure medications.  Suspect that part of the elevation today is due to associated pain.  Patient given a dose of HCTZ here.  Strongly encouraged to take home medications and follow-up with PCP for recheck.  Otherwise, from a hypertension standpoint, he appears to be asymptomatic.    Final Clinical Impression(s) / ED Diagnoses Final diagnoses:  Epididymitis  Primary hypertension    Rx / DC Orders ED Discharge Orders          Ordered    doxycycline (VIBRAMYCIN) 100 MG capsule  2 times daily        12/09/20 2200    oxyCODONE (OXY IR/ROXICODONE) 5 MG immediate release tablet  Every 6 hours PRN        12/09/20 2200             12/11/20, PA-C 12/09/20 2205    2206, MD 12/13/20 2300

## 2020-12-09 NOTE — Discharge Instructions (Signed)
Please read and follow all provided instructions.  Your diagnoses today include:  1. Epididymitis   2. Primary hypertension     Tests performed today include: Ultrasound of the scrotum -shows epididymitis Urine test -with signs of infection Sexually transmitted infection testing -pending results Vital signs. See below for your results today.   Medications prescribed:  Doxycycline - antibiotic  You have been prescribed an antibiotic medicine: take the entire course of medicine even if you are feeling better. Stopping early can cause the antibiotic not to work.  Oxycodone - narcotic pain medication  DO NOT drive or perform any activities that require you to be awake and alert because this medicine can make you drowsy.   Take any prescribed medications only as directed.  Home care instructions:  Follow any educational materials contained in this packet.  BE VERY CAREFUL not to take multiple medicines containing Tylenol (also called acetaminophen). Doing so can lead to an overdose which can damage your liver and cause liver failure and possibly death.   Follow-up instructions: Please follow-up with your primary care provider in the next 7 days for further evaluation of your symptoms and recheck of your blood pressure.   Return instructions:  Please return to the Emergency Department if you experience worsening symptoms.  Please return if you have any other emergent concerns.  Additional Information:  Your vital signs today were: BP (!) 201/110 (BP Location: Right Arm)   Pulse 67   Temp 99.3 F (37.4 C) (Oral)   Resp 18   Ht 5\' 10"  (1.778 m)   Wt 87.5 kg   SpO2 96%   BMI 27.69 kg/m  If your blood pressure (BP) was elevated above 135/85 this visit, please have this repeated by your doctor within one month. --------------

## 2020-12-09 NOTE — ED Triage Notes (Signed)
c/o testicle pain and swelling x 2 days, denies injury

## 2020-12-10 LAB — GC/CHLAMYDIA PROBE AMP (~~LOC~~) NOT AT ARMC
Chlamydia: NEGATIVE
Comment: NEGATIVE
Comment: NORMAL
Neisseria Gonorrhea: NEGATIVE

## 2021-01-28 ENCOUNTER — Inpatient Hospital Stay (HOSPITAL_COMMUNITY)
Admission: EM | Disposition: A | Discharge status: Home / Self Care | Payer: Self-pay | Source: Home / Self Care | Attending: Cardiology

## 2021-01-28 ENCOUNTER — Inpatient Hospital Stay (HOSPITAL_COMMUNITY)
Admission: EM | Admit: 2021-01-28 | Discharge: 2021-01-30 | DRG: 247 | Disposition: A | Discharge status: Home / Self Care | Payer: Self-pay | Attending: Cardiology | Admitting: Cardiology

## 2021-01-28 ENCOUNTER — Emergency Department (HOSPITAL_COMMUNITY): Payer: Self-pay

## 2021-01-28 DIAGNOSIS — S0990XA Unspecified injury of head, initial encounter: Secondary | ICD-10-CM

## 2021-01-28 DIAGNOSIS — R001 Bradycardia, unspecified: Secondary | ICD-10-CM | POA: Diagnosis present

## 2021-01-28 DIAGNOSIS — S0081XA Abrasion of other part of head, initial encounter: Secondary | ICD-10-CM | POA: Diagnosis present

## 2021-01-28 DIAGNOSIS — Z79899 Other long term (current) drug therapy: Secondary | ICD-10-CM

## 2021-01-28 DIAGNOSIS — I10 Essential (primary) hypertension: Secondary | ICD-10-CM | POA: Diagnosis present

## 2021-01-28 DIAGNOSIS — N179 Acute kidney failure, unspecified: Secondary | ICD-10-CM | POA: Diagnosis present

## 2021-01-28 DIAGNOSIS — I472 Ventricular tachycardia, unspecified: Secondary | ICD-10-CM | POA: Diagnosis present

## 2021-01-28 DIAGNOSIS — I25110 Atherosclerotic heart disease of native coronary artery with unstable angina pectoris: Secondary | ICD-10-CM | POA: Diagnosis present

## 2021-01-28 DIAGNOSIS — Z955 Presence of coronary angioplasty implant and graft: Secondary | ICD-10-CM

## 2021-01-28 DIAGNOSIS — I959 Hypotension, unspecified: Secondary | ICD-10-CM | POA: Diagnosis present

## 2021-01-28 DIAGNOSIS — E785 Hyperlipidemia, unspecified: Secondary | ICD-10-CM | POA: Diagnosis present

## 2021-01-28 DIAGNOSIS — Z716 Tobacco abuse counseling: Secondary | ICD-10-CM

## 2021-01-28 DIAGNOSIS — Z20822 Contact with and (suspected) exposure to covid-19: Secondary | ICD-10-CM | POA: Diagnosis present

## 2021-01-28 DIAGNOSIS — Z8249 Family history of ischemic heart disease and other diseases of the circulatory system: Secondary | ICD-10-CM

## 2021-01-28 DIAGNOSIS — I259 Chronic ischemic heart disease, unspecified: Secondary | ICD-10-CM

## 2021-01-28 DIAGNOSIS — F1210 Cannabis abuse, uncomplicated: Secondary | ICD-10-CM | POA: Diagnosis present

## 2021-01-28 DIAGNOSIS — W19XXXA Unspecified fall, initial encounter: Secondary | ICD-10-CM | POA: Diagnosis present

## 2021-01-28 DIAGNOSIS — F17210 Nicotine dependence, cigarettes, uncomplicated: Secondary | ICD-10-CM | POA: Diagnosis present

## 2021-01-28 DIAGNOSIS — I2111 ST elevation (STEMI) myocardial infarction involving right coronary artery: Secondary | ICD-10-CM

## 2021-01-28 DIAGNOSIS — S0003XA Contusion of scalp, initial encounter: Secondary | ICD-10-CM | POA: Diagnosis present

## 2021-01-28 DIAGNOSIS — I213 ST elevation (STEMI) myocardial infarction of unspecified site: Secondary | ICD-10-CM

## 2021-01-28 DIAGNOSIS — I2119 ST elevation (STEMI) myocardial infarction involving other coronary artery of inferior wall: Principal | ICD-10-CM | POA: Diagnosis present

## 2021-01-28 HISTORY — PX: CORONARY/GRAFT ACUTE MI REVASCULARIZATION: CATH118305

## 2021-01-28 HISTORY — PX: LEFT HEART CATH AND CORONARY ANGIOGRAPHY: CATH118249

## 2021-01-28 LAB — I-STAT CHEM 8, ED
BUN: 14 mg/dL (ref 6–20)
Calcium, Ion: 1.16 mmol/L (ref 1.15–1.40)
Chloride: 106 mmol/L (ref 98–111)
Creatinine, Ser: 1.6 mg/dL — ABNORMAL HIGH (ref 0.61–1.24)
Glucose, Bld: 163 mg/dL — ABNORMAL HIGH (ref 70–99)
HCT: 51 % (ref 39.0–52.0)
Hemoglobin: 17.3 g/dL — ABNORMAL HIGH (ref 13.0–17.0)
Potassium: 4.1 mmol/L (ref 3.5–5.1)
Sodium: 143 mmol/L (ref 135–145)
TCO2: 28 mmol/L (ref 22–32)

## 2021-01-28 LAB — CBC WITH DIFFERENTIAL/PLATELET
Abs Immature Granulocytes: 0.03 10*3/uL (ref 0.00–0.07)
Basophils Absolute: 0.1 10*3/uL (ref 0.0–0.1)
Basophils Relative: 1 %
Eosinophils Absolute: 0.1 10*3/uL (ref 0.0–0.5)
Eosinophils Relative: 2 %
HCT: 48.9 % (ref 39.0–52.0)
Hemoglobin: 15.8 g/dL (ref 13.0–17.0)
Immature Granulocytes: 0 %
Lymphocytes Relative: 30 %
Lymphs Abs: 2.6 10*3/uL (ref 0.7–4.0)
MCH: 30.2 pg (ref 26.0–34.0)
MCHC: 32.3 g/dL (ref 30.0–36.0)
MCV: 93.3 fL (ref 80.0–100.0)
Monocytes Absolute: 0.7 10*3/uL (ref 0.1–1.0)
Monocytes Relative: 8 %
Neutro Abs: 5.2 10*3/uL (ref 1.7–7.7)
Neutrophils Relative %: 59 %
Platelets: 245 10*3/uL (ref 150–400)
RBC: 5.24 MIL/uL (ref 4.22–5.81)
RDW: 14.3 % (ref 11.5–15.5)
WBC: 8.6 10*3/uL (ref 4.0–10.5)
nRBC: 0 % (ref 0.0–0.2)

## 2021-01-28 LAB — APTT: aPTT: 27 seconds (ref 24–36)

## 2021-01-28 LAB — COMPREHENSIVE METABOLIC PANEL
ALT: 16 U/L (ref 0–44)
AST: 22 U/L (ref 15–41)
Albumin: 3.7 g/dL (ref 3.5–5.0)
Alkaline Phosphatase: 77 U/L (ref 38–126)
Anion gap: 7 (ref 5–15)
BUN: 12 mg/dL (ref 6–20)
CO2: 25 mmol/L (ref 22–32)
Calcium: 9.5 mg/dL (ref 8.9–10.3)
Chloride: 108 mmol/L (ref 98–111)
Creatinine, Ser: 1.55 mg/dL — ABNORMAL HIGH (ref 0.61–1.24)
GFR, Estimated: 53 mL/min — ABNORMAL LOW (ref >60)
Glucose, Bld: 167 mg/dL — ABNORMAL HIGH (ref 70–99)
Potassium: 4.1 mmol/L (ref 3.5–5.1)
Sodium: 140 mmol/L (ref 135–145)
Total Bilirubin: 0.2 mg/dL — ABNORMAL LOW (ref 0.3–1.2)
Total Protein: 6.7 g/dL (ref 6.5–8.1)

## 2021-01-28 LAB — RESP PANEL BY RT-PCR (FLU A&B, COVID) ARPGX2
Influenza A by PCR: NEGATIVE
Influenza B by PCR: NEGATIVE
SARS Coronavirus 2 by RT PCR: NEGATIVE

## 2021-01-28 LAB — HEMOGLOBIN A1C
Hgb A1c MFr Bld: 5.6 % (ref 4.8–5.6)
Mean Plasma Glucose: 114.02 mg/dL

## 2021-01-28 LAB — LIPID PANEL
Cholesterol: 92 mg/dL (ref 0–200)
HDL: 45 mg/dL (ref >40)
LDL Cholesterol: 34 mg/dL (ref 0–99)
Total CHOL/HDL Ratio: 2 RATIO
Triglycerides: 65 mg/dL (ref <150)
VLDL: 13 mg/dL (ref 0–40)

## 2021-01-28 LAB — TROPONIN I (HIGH SENSITIVITY): Troponin I (High Sensitivity): 16 ng/L (ref <18)

## 2021-01-28 LAB — PROTIME-INR
INR: 1 (ref 0.8–1.2)
Prothrombin Time: 13.3 seconds (ref 11.4–15.2)

## 2021-01-28 SURGERY — CORONARY/GRAFT ACUTE MI REVASCULARIZATION
Anesthesia: LOCAL

## 2021-01-28 MED ORDER — FENTANYL CITRATE (PF) 100 MCG/2ML IJ SOLN
INTRAMUSCULAR | Status: AC
  Filled 2021-01-28: qty 2

## 2021-01-28 MED ORDER — TICAGRELOR 90 MG PO TABS
ORAL_TABLET | ORAL | Status: AC
  Filled 2021-01-28: qty 1

## 2021-01-28 MED ORDER — IRBESARTAN 75 MG PO TABS
75.0000 mg | ORAL_TABLET | Freq: Every day | ORAL | Status: DC
  Administered 2021-01-29: 75 mg via ORAL
  Filled 2021-01-28: qty 1

## 2021-01-28 MED ORDER — DOPAMINE-DEXTROSE 3.2-5 MG/ML-% IV SOLN
INTRAVENOUS | Status: AC
  Filled 2021-01-28: qty 250

## 2021-01-28 MED ORDER — ASPIRIN 81 MG PO CHEW
81.0000 mg | CHEWABLE_TABLET | Freq: Every day | ORAL | Status: DC
  Administered 2021-01-29 – 2021-01-30 (×2): 81 mg via ORAL
  Filled 2021-01-28 (×2): qty 1

## 2021-01-28 MED ORDER — IOHEXOL 350 MG/ML SOLN
INTRAVENOUS | Status: DC | PRN
  Administered 2021-01-28: 125 mL

## 2021-01-28 MED ORDER — TIROFIBAN (AGGRASTAT) BOLUS VIA INFUSION
INTRAVENOUS | Status: DC | PRN
  Administered 2021-01-28: 2040 ug via INTRAVENOUS

## 2021-01-28 MED ORDER — HEPARIN (PORCINE) IN NACL 1000-0.9 UT/500ML-% IV SOLN
INTRAVENOUS | Status: DC | PRN
  Administered 2021-01-28 (×2): 500 mL

## 2021-01-28 MED ORDER — OXYCODONE HCL 5 MG PO TABS
5.0000 mg | ORAL_TABLET | Freq: Four times a day (QID) | ORAL | Status: DC | PRN
  Administered 2021-01-28 – 2021-01-29 (×4): 5 mg via ORAL
  Filled 2021-01-28 (×4): qty 1

## 2021-01-28 MED ORDER — ONDANSETRON HCL 4 MG/2ML IJ SOLN: 4.0000 mg | Freq: Four times a day (QID) | INTRAMUSCULAR | Status: DC | PRN

## 2021-01-28 MED ORDER — TIROFIBAN HCL IN NACL 5-0.9 MG/100ML-% IV SOLN
INTRAVENOUS | Status: AC
  Filled 2021-01-28: qty 100

## 2021-01-28 MED ORDER — VERAPAMIL HCL 2.5 MG/ML IV SOLN
INTRAVENOUS | Status: AC
  Filled 2021-01-28: qty 2

## 2021-01-28 MED ORDER — LABETALOL HCL 5 MG/ML IV SOLN: 10.0000 mg | INTRAVENOUS | Status: DC | PRN

## 2021-01-28 MED ORDER — HEPARIN SODIUM (PORCINE) 1000 UNIT/ML IJ SOLN
INTRAMUSCULAR | Status: DC | PRN
  Administered 2021-01-28: 4000 [IU] via INTRAVENOUS
  Administered 2021-01-28: 6000 [IU] via INTRAVENOUS

## 2021-01-28 MED ORDER — TIROFIBAN HCL IN NACL 5-0.9 MG/100ML-% IV SOLN
INTRAVENOUS | Status: DC | PRN
  Administered 2021-01-28: .075 ug/kg/min via INTRAVENOUS

## 2021-01-28 MED ORDER — NITROGLYCERIN 0.4 MG SL SUBL
SUBLINGUAL_TABLET | SUBLINGUAL | Status: AC
  Administered 2021-01-28: 0.4 mg
  Filled 2021-01-28: qty 1

## 2021-01-28 MED ORDER — HYDRALAZINE HCL 20 MG/ML IJ SOLN
10.0000 mg | INTRAMUSCULAR | Status: AC | PRN
  Administered 2021-01-28: 10 mg via INTRAVENOUS
  Filled 2021-01-28: qty 1

## 2021-01-28 MED ORDER — LIDOCAINE HCL (PF) 1 % IJ SOLN
INTRAMUSCULAR | Status: DC | PRN
  Administered 2021-01-28: 2 mL

## 2021-01-28 MED ORDER — LIDOCAINE HCL (PF) 1 % IJ SOLN
INTRAMUSCULAR | Status: AC
  Filled 2021-01-28: qty 30

## 2021-01-28 MED ORDER — MIDAZOLAM HCL 2 MG/2ML IJ SOLN
INTRAMUSCULAR | Status: DC | PRN
  Administered 2021-01-28: 1 mg via INTRAVENOUS

## 2021-01-28 MED ORDER — MIDAZOLAM HCL 2 MG/2ML IJ SOLN
INTRAMUSCULAR | Status: AC
  Filled 2021-01-28: qty 2

## 2021-01-28 MED ORDER — CARVEDILOL 3.125 MG PO TABS
3.1250 mg | ORAL_TABLET | Freq: Two times a day (BID) | ORAL | Status: DC
  Administered 2021-01-28: 3.125 mg via ORAL
  Filled 2021-01-28: qty 1

## 2021-01-28 MED ORDER — SODIUM CHLORIDE 0.9% FLUSH
3.0000 mL | Freq: Two times a day (BID) | INTRAVENOUS | Status: DC
  Administered 2021-01-29 – 2021-01-30 (×4): 3 mL via INTRAVENOUS

## 2021-01-28 MED ORDER — HEPARIN (PORCINE) IN NACL 1000-0.9 UT/500ML-% IV SOLN
INTRAVENOUS | Status: AC
  Filled 2021-01-28: qty 1000

## 2021-01-28 MED ORDER — HEPARIN SODIUM (PORCINE) 1000 UNIT/ML IJ SOLN
INTRAMUSCULAR | Status: AC
  Filled 2021-01-28: qty 1

## 2021-01-28 MED ORDER — FENTANYL CITRATE (PF) 100 MCG/2ML IJ SOLN
INTRAMUSCULAR | Status: DC | PRN
  Administered 2021-01-28: 25 ug via INTRAVENOUS

## 2021-01-28 MED ORDER — SODIUM CHLORIDE 0.9% FLUSH: 3.0000 mL | INTRAVENOUS | Status: DC | PRN

## 2021-01-28 MED ORDER — ATORVASTATIN CALCIUM 80 MG PO TABS
80.0000 mg | ORAL_TABLET | Freq: Every day | ORAL | Status: DC
  Administered 2021-01-29 – 2021-01-30 (×2): 80 mg via ORAL
  Filled 2021-01-28 (×2): qty 1

## 2021-01-28 MED ORDER — ATROPINE SULFATE 1 MG/10ML IJ SOSY
PREFILLED_SYRINGE | INTRAMUSCULAR | Status: AC
  Filled 2021-01-28: qty 10

## 2021-01-28 MED ORDER — ATROPINE SULFATE 1 MG/10ML IJ SOSY
PREFILLED_SYRINGE | INTRAMUSCULAR | Status: DC | PRN
  Administered 2021-01-28 (×2): .5 mg via INTRAVENOUS

## 2021-01-28 MED ORDER — TICAGRELOR 90 MG PO TABS
ORAL_TABLET | ORAL | Status: DC | PRN
  Administered 2021-01-28: 180 mg via ORAL

## 2021-01-28 MED ORDER — SODIUM CHLORIDE 0.9 % IV SOLN: INTRAVENOUS | Status: AC

## 2021-01-28 MED ORDER — SODIUM CHLORIDE 0.9 % IV SOLN
INTRAVENOUS | Status: DC | PRN
  Administered 2021-01-28: 100 mL via INTRAVENOUS

## 2021-01-28 MED ORDER — VERAPAMIL HCL 2.5 MG/ML IV SOLN
INTRAVENOUS | Status: DC | PRN
  Administered 2021-01-28: 10 mL via INTRA_ARTERIAL

## 2021-01-28 MED ORDER — ACETAMINOPHEN 325 MG PO TABS: 650.0000 mg | ORAL_TABLET | ORAL | Status: DC | PRN

## 2021-01-28 MED ORDER — TIROFIBAN HCL IN NACL 5-0.9 MG/100ML-% IV SOLN: 0.0750 ug/kg/min | INTRAVENOUS | Status: DC

## 2021-01-28 MED ORDER — SODIUM CHLORIDE 0.9 % IV SOLN: 250.0000 mL | INTRAVENOUS | Status: DC | PRN

## 2021-01-28 MED ORDER — TICAGRELOR 90 MG PO TABS
90.0000 mg | ORAL_TABLET | Freq: Two times a day (BID) | ORAL | Status: DC
  Administered 2021-01-29 – 2021-01-30 (×3): 90 mg via ORAL
  Filled 2021-01-28 (×3): qty 1

## 2021-01-28 SURGICAL SUPPLY — 20 items
BALLN SAPPHIRE 3.0X12 (BALLOONS) ×2
BALLOON SAPPHIRE 3.0X12 (BALLOONS) IMPLANT
CATH INFINITI 5FR AL1 (CATHETERS) ×1 IMPLANT
CATH INFINITI 5FR ANG PIGTAIL (CATHETERS) ×1 IMPLANT
CATH LAUNCHER 6FR AL.75 (CATHETERS) ×1 IMPLANT
CATH LAUNCHER 6FR JR4 (CATHETERS) ×1 IMPLANT
CATH OPTITORQUE TIG 4.0 5F (CATHETERS) ×1 IMPLANT
DEVICE RAD COMP TR BAND LRG (VASCULAR PRODUCTS) ×1 IMPLANT
GLIDESHEATH SLEND SS 6F .021 (SHEATH) ×1 IMPLANT
GUIDEWIRE INQWIRE 1.5J.035X260 (WIRE) IMPLANT
INQWIRE 1.5J .035X260CM (WIRE) ×2
KIT ENCORE 26 ADVANTAGE (KITS) ×1 IMPLANT
KIT HEART LEFT (KITS) ×2 IMPLANT
PACK CARDIAC CATHETERIZATION (CUSTOM PROCEDURE TRAY) ×2 IMPLANT
STENT ONYX FRONTIER 4.0X34 (Permanent Stent) ×1 IMPLANT
SYR MEDRAD MARK 7 150ML (SYRINGE) ×2 IMPLANT
TRANSDUCER W/STOPCOCK (MISCELLANEOUS) ×2 IMPLANT
TUBING CIL FLEX 10 FLL-RA (TUBING) ×2 IMPLANT
WIRE ASAHI PROWATER 180CM (WIRE) ×1 IMPLANT
WIRE EMERALD ST .035X260CM (WIRE) ×1 IMPLANT

## 2021-01-28 NOTE — ED Triage Notes (Signed)
Pt BIB [facility] after falling in bathroom and hitting his head, pt reported chest pain starting yesterday and today about an hour ago. Patient diaphoretic and nauseous upon EMS arrival. EMS administered 324mg  aspirin and 0.4mg  of nitro with no relief from CP, maintained at 8/10.   BP 183/100 CBG 193 HR 60 99% RA RR 18

## 2021-01-28 NOTE — H&P (Addendum)
Cardiology Admission History and Physical:   Patient ID: Lawrence Pope MRN: [id number]; DOB: 1965-05-11   Admission date: 01/28/2021  PCP:  Patient, No Pcp Per (Inactive)   [facility] Providers Cardiologist:  None        Chief Complaint:  Chest pain  Patient Profile:   Lawrence Pope is a 55 y.o. male with pmh sx for HTN who is being seen 01/28/2021 for the evaluation of STEMI.   History of Present Illness:   Lawrence Pope is a 55 y.o. male with pmh sx for HTN who is being seen 01/28/2021 for the evaluation of STEMI. He was in his usual state of health till today when he was smoking marijuana; after which developed chest pain. He also became dizzy and lightheaded; this has been going on for few days; but the chest pain started an hour prior to presentation. EMS was called; and EKG showed inferior STEMI hence was brought here. Here he stated he has 8/10 chest pain; had a small injury on head; VS were stable with BP being slightly high. CT head was done in the ED which showed no acute ICH. Subsequently was taken to the cath lab emergently. Of note has not been his BP meds since few weeks per him. Cr today 1.6; up from 0.99.   EKG upon arrival to [facility] showed 1 to 2 mm ST elevation in II, 2 to 3 mm and III & aVF notable  reciprocal changes in anterior lateral leads with the  up to 2 to 3 mm.  Confirmed Inferior STEMI.  He reported having intermittent chest discomfort over the last couple days, felt dizzy and lightheaded yesterday with a fall and then again today.   Past Medical History:  Diagnosis Date   Hx of migraines    Hypertension     No past surgical history on file.   Medications Prior to Admission: Prior to Admission medications   Medication Sig Start Date End Date Taking? Authorizing Provider  doxycycline (VIBRAMYCIN) 100 MG capsule Take 1 capsule (100 mg total) by mouth 2 (two) times daily. 12/09/20   Renne Crigler, PA-C  hydrochlorothiazide (HYDRODIURIL) 25 MG  tablet Take 1 tablet (25 mg total) by mouth daily. 09/08/16   Naida Sleight, PA-C  oxyCODONE (OXY IR/ROXICODONE) 5 MG immediate release tablet Take 1 tablet (5 mg total) by mouth every 6 (six) hours as needed for severe pain. 12/09/20   Renne Crigler, PA-C   -He says he is been out of his antihypertensive agents for about 2 weeks.  Allergies:   No Known Allergies  Social History:   Social History   Socioeconomic History   Marital status: Legally Separated    Spouse name: Not on file   Number of children: Not on file   Years of education: Not on file   Highest education level: Not on file  Occupational History   Not on file  Tobacco Use   Smoking status: Every Day    Packs/day: 1.00    Types: Cigarettes   Smokeless tobacco: Never  Substance and Sexual Activity   Alcohol use: Yes    Alcohol/week: 8.0 standard drinks    Types: 8 Cans of beer per week    Comment: occ   Drug use: Not Currently   Sexual activity: Not on file  Other Topics Concern   Not on file  Social History Narrative   Not on file   Social Determinants of Health   Financial Resource Strain: Not on file  Food Insecurity: Not on file  Transportation Needs: Not on file  Physical Activity: Not on file  Stress: Not on file  Social Connections: Not on file  Intimate Partner Violence: Not on file    Family History:  No hx of sudden deaths.  The patient's family history includes Cancer in his father; Heart attack in his mother; Hypertension in his father and mother.    ROS:  Please see the history of present illness.  All other ROS reviewed and negative.    + Off-and-on chest comfort for couple days, lightheadedness dizziness with 2 falls in the last 2 days, head contusions.  Near syncope.  Nausea and diaphoresis associated chest pain today  Physical Exam/Data:   Vitals:   01/28/21 2137 01/28/21 2142 01/28/21 2147 01/28/21 2152  BP: (!) 157/94 (!) 147/92 (!) 161/95 (!) 145/88  Pulse: 74 (!) 57 74 75   Resp: 16 19 20 18   SpO2: 99% 98% 98% 98%  Weight:       No intake or output data in the 24 hours ending 01/28/21 2203 Last 3 Weights 01/28/2021 12/09/2020 11/10/2020  Weight (lbs) 180 lb 193 lb 188 lb 15 oz  Weight (kg) 81.647 kg 87.544 kg 85.7 kg     Body mass index is 25.83 kg/m.  General:  Well nourished, well developed, moderate distress.  HEENT: normal Neck: no JVD Vascular: No carotid bruits; Distal pulses 2+ bilaterally   Cardiac:  normal S1, S2; RRR; no murmur  Lungs:  Poor airflow Abd: soft, nontender, no hepatomegaly  Ext: no edema Musculoskeletal:  No deformities, BUE and BLE strength normal and equal. Some injury on head Skin: warm and dry  Neuro:  CNs 2-12 intact, no focal abnormalities noted.  Psych:  Not talking too much.    EKG:  The ECG that was done was personally reviewed and demonstrates ST elevation in II,III and AVF with depressions in AVL   Laboratory Data:  High Sensitivity Troponin:   Recent Labs  Lab 01/28/21 2010  TROPONINIHS 16      Chemistry Recent Labs  Lab 01/28/21 2010 01/28/21 2017  NA 140 143  K 4.1 4.1  CL 108 106  CO2 25  --   GLUCOSE 167* 163*  BUN 12 14  CREATININE 1.55* 1.60*  CALCIUM 9.5  --   GFRNONAA 53*  --   ANIONGAP 7  --     Recent Labs  Lab 01/28/21 2010  PROT 6.7  ALBUMIN 3.7  AST 22  ALT 16  ALKPHOS 77  BILITOT 0.2*   Lipids  Recent Labs  Lab 01/28/21 2010  CHOL 92  TRIG 65  HDL 45  LDLCALC 34  CHOLHDL 2.0   Hematology Recent Labs  Lab 01/28/21 2010 01/28/21 2017  WBC 8.6  --   RBC 5.24  --   HGB 15.8 17.3*  HCT 48.9 51.0  MCV 93.3  --   MCH 30.2  --   MCHC 32.3  --   RDW 14.3  --   PLT 245  --    Thyroid No results for input(s): TSH, FREET4 in the last 168 hours. BNPNo results for input(s): BNP, PROBNP in the last 168 hours.  DDimer No results for input(s): DDIMER in the last 168 hours.   Radiology/Studies:  CT Head Wo Contrast  Result Date: 01/28/2021 CLINICAL DATA:   Initial evaluation for acute head trauma, fall, STEMI. EXAM: CT HEAD WITHOUT CONTRAST TECHNIQUE: Contiguous axial images were obtained from the base of the  skull through the vertex without intravenous contrast. COMPARISON: None available. FINDINGS: Brain: Cerebral volume within normal limits for age. Scattered patchy hypodensity involving the periventricular, deep, and subcortical white matter of both cerebral hemispheres, most pronounced at the frontal lobes, nonspecific, but most like related chronic microvascular ischemic disease. No acute intracranial hemorrhage. No acute large vessel territory infarct. No mass lesion or midline shift. No hydrocephalus or extra-axial fluid collection. Vascular: No visible hyperdense vessel. Scattered vascular calcifications noted within the carotid siphons. Skull: Small soft tissue contusion present at the right frontal scalp. No calvarial fracture. Sinuses/Orbits: Globes and orbital soft tissues demonstrate no acute finding. Scattered mucosal thickening noted within the frontoethmoidal and sphenoid sinuses. No mastoid effusion. Other: None. IMPRESSION: 1. No acute intracranial abnormality. 2. Small right frontal scalp contusion. No calvarial fracture. 3. Patchy hypodensities involving the supratentorial cerebral white matter, nonspecific, but most commonly related to chronic microvascular ischemic disease. Results discussed by telephone at the time of interpretation on 01/28/2021 at 8:37 pm to provider Vanetta Mulders , who verbally acknowledged these results. Electronically Signed   By: Rise Mu M.D.   On: 01/28/2021 20:48   DG Chest Portable 1 View  Result Date: 01/28/2021 CLINICAL DATA:  Status post fall with chest pain. EXAM: PORTABLE CHEST 1 VIEW COMPARISON:  October 04, 2016 FINDINGS: Mild hazy opacification is seen along the lateral aspect of the mid left lung. There is no evidence of a pleural effusion or pneumothorax. The heart size and mediastinal contours  are within normal limits. The visualized skeletal structures are unremarkable. IMPRESSION: 1. Mild hazy opacification along the lateral aspect of the mid left lung, which may represent an area atelectasis and/or infiltrate. Given the patient's recent history of fall, an area of pulmonary contusion can not be excluded. Electronically Signed   By: Aram Candela M.D.   On: 01/28/2021 20:36     Assessment and Plan:   # STEMI -LHC emergently -DAPT for 12 months if blockage found -Atorvastatin 80 -BB and ACE-I as tolerated -Echo in AM -Counsel on smoking cessation -Rehab at discharge  # Marijuana and Nicotine Use: Counsel against use # HTN: BB and ACE-I as tolerated # AKI: Most likely due to pre-renal. Monitor Cr # Frontal scalp contusion: CT unremarkable. Monitor --due to 2 falls in 2 days with head contusions, I felt that prudent to exclude any head bleed prior to taking him to the Cath Lab for heparin and possible Aggrastat.  This led to a slight delay in arrival to the Cath Lab.   Signed, Hermelinda Dellen, MD  01/28/2021 8:54 PM     ATTENDING ATTESTATION  I have seen, examined and evaluated the patient this PM in the ER along with Dr. Welton Flakes.  After reviewing all the available data and chart, we discussed the patients laboratory, study & physical findings as well as symptoms in detail. I agree with his findings, examination as well as impression recommendations as per our discussion.    Attending adjustments noted in italics.   Presented with inferior STEMI, confirmed on EKG on arrival to the [facility].  Still having 7-8 out of 10 chest pain while in the emergency room.  Acknowledge that he had 2 falls with head contusions in the last 2 days.  Therefore taken to the CT scan to scan for any signs of intra cerebral hemorrhage prior to catheterization.  This had a stat read showing no evidence of bleeding and therefore was taken emergently to the Cath Lab.  Was  actually hypertensive upon  arrival and borderline bradycardic but still in sinus rhythm.   Risk Assessment/Risk Scores:    TIMI Risk Score for ST  Elevation MI:   The patient's TIMI risk score is 0, which indicates a 0.8% risk of all cause mortality at 30 days.{   Severity of Illness: The appropriate patient status for this patient is INPATIENT. Inpatient status is judged to be reasonable and necessary in order to provide the required intensity of service to ensure the patient's safety. The patient's presenting symptoms, physical exam findings, and initial radiographic and laboratory data in the context of their chronic comorbidities is felt to place them at high risk for further clinical deterioration. Furthermore, it is not anticipated that the patient will be medically stable for discharge from the hospital within 2 midnights of admission. The following factors support the patient status of inpatient.   " The patient's presenting symptoms include acute onset substernal chest pain associated lightheadedness dizziness and near syncope.. " The worrisome physical exam findings include diaphoresis. " The initial radiographic and laboratory data are worrisome because of EKG with inferior STEMI. " The chronic co-morbidities include hypertension, substance abuse.   * I certify that at the point of admission it is my clinical judgment that the patient will require inpatient hospital care spanning beyond 2 midnights from the point of admission due to high intensity of service, high risk for further deterioration and high frequency of surveillance required.*   For questions or updates, please contact [facility] Please consult [email or web address] for contact info under     Signed, Bryan Lemma, MD  01/28/2021 10:03 PM   Bryan Lemma, M.D., M.S. Interventional Cardiologist   Pager # [phone] Phone # [phone] [address] [place], [place] [address]

## 2021-01-28 NOTE — ED Provider Notes (Signed)
[facility] EMERGENCY DEPARTMENT Provider Note   CSN: [id number] Arrival date & time: 01/28/21  2006     History Chief Complaint  Patient presents with   Code STEMI    Lawrence Pope is a 55 y.o. male.  Patient arriving by EMS.  STEMI called in the field.  Field EKG highly suggestive of inferior MI.  Apparently patient's had chest pain for about an hour prior to arrival.  Patient also got very lightheaded and dizzy and fell in the bathroom and hit his head.  Has a contusion to his forehead with abrasion.  Denies loss of consciousness.  Patient states pain is still 8 out of 10.  EMS gave him nitroglycerin and aspirin.  Patient admits to smoking marijuana today.  Past medical history sniffing for hypertension and migraines.  This pain is anterior chest.       Past Medical History:  Diagnosis Date   Hx of migraines    Hypertension     Patient Active Problem List   Diagnosis Date Noted   Acute ST elevation myocardial infarction (STEMI) of inferior wall (HCC) 01/28/2021   Essential hypertension 01/28/2021   Plantar fasciitis of left foot 05/03/2019    No past surgical history on file.     Family History  Problem Relation Age of Onset   Hypertension Mother    Heart attack Mother    Cancer Father    Hypertension Father     Social History   Tobacco Use   Smoking status: Every Day    Packs/day: 1.00    Types: Cigarettes   Smokeless tobacco: Never  Substance Use Topics   Alcohol use: Yes    Alcohol/week: 8.0 standard drinks    Types: 8 Cans of beer per week    Comment: occ   Drug use: Not Currently    Home Medications Prior to Admission medications   Medication Sig Start Date End Date Taking? Authorizing Provider  doxycycline (VIBRAMYCIN) 100 MG capsule Take 1 capsule (100 mg total) by mouth 2 (two) times daily. 12/09/20   Renne Crigler, PA-C  hydrochlorothiazide (HYDRODIURIL) 25 MG tablet Take 1 tablet (25 mg total) by mouth daily. 09/08/16    Naida Sleight, PA-C  oxyCODONE (OXY IR/ROXICODONE) 5 MG immediate release tablet Take 1 tablet (5 mg total) by mouth every 6 (six) hours as needed for severe pain. 12/09/20   Renne Crigler, PA-C    Allergies    Patient has no known allergies.  Review of Systems   Review of Systems  Constitutional:  Negative for chills and fever.  HENT:  Negative for ear pain and sore throat.   Eyes:  Negative for pain and visual disturbance.  Respiratory:  Negative for cough and shortness of breath.   Cardiovascular:  Positive for chest pain. Negative for palpitations.  Gastrointestinal:  Positive for nausea. Negative for abdominal pain and vomiting.  Genitourinary:  Negative for dysuria and hematuria.  Musculoskeletal:  Negative for arthralgias and back pain.  Skin:  Positive for wound. Negative for color change and rash.  Neurological:  Negative for seizures and syncope.  All other systems reviewed and are negative.  Physical Exam Updated Vital Signs Wt 81.6 kg   SpO2 99%   BMI 25.83 kg/m   Physical Exam Vitals and nursing note reviewed.  Constitutional:      General: He is not in acute distress.    Appearance: Normal appearance. He is well-developed.  HENT:     Head: Normocephalic.  Comments: Patient with contusion to anterior forehead with abrasions measuring about 2 x 2 cm.  No active bleeding. Eyes:     Conjunctiva/sclera: Conjunctivae normal.     Pupils: Pupils are equal, round, and reactive to light.  Neck:     Comments: Neck good movement no posterior tenderness to palpation. Cardiovascular:     Rate and Rhythm: Normal rate and regular rhythm.     Heart sounds: No murmur heard. Pulmonary:     Effort: Pulmonary effort is normal. No respiratory distress.     Breath sounds: Normal breath sounds. No wheezing or rales.  Chest:     Chest wall: No tenderness.  Abdominal:     Palpations: Abdomen is soft.     Tenderness: There is no abdominal tenderness.  Musculoskeletal:         General: No swelling.     Cervical back: Neck supple. No rigidity or tenderness.  Skin:    General: Skin is warm and dry.     Capillary Refill: Capillary refill takes less than 2 seconds.  Neurological:     General: No focal deficit present.     Mental Status: He is alert and oriented to person, place, and time.     Comments: Neuro exam grossly intact all movement of all 4 extremities.  Patient seems to be mentating fine.    ED Results / Procedures / Treatments   Labs (all labs ordered are listed, but only abnormal results are displayed) Labs Reviewed  I-STAT CHEM 8, ED - Abnormal; Notable for the following components:      Result Value   Creatinine, Ser 1.60 (*)    Glucose, Bld 163 (*)    Hemoglobin 17.3 (*)    All other components within normal limits  CBC WITH DIFFERENTIAL/PLATELET  HEMOGLOBIN A1C  PROTIME-INR  APTT  COMPREHENSIVE METABOLIC PANEL  LIPID PANEL  TROPONIN I (HIGH SENSITIVITY)    EKG EKG Interpretation  Date/Time:  Tuesday January 28 2021 20:12:12 EDT Ventricular Rate:  54 PR Interval:  204 QRS Duration: 102 QT Interval:  436 QTC Calculation: 414 R Axis:   63 Text Interpretation: Sinus rhythm Borderline prolonged PR interval Inferior infarct, acute (RCA) Anteroseptal infarct, age indeterminate Probable RV involvement, suggest recording right precordial leads >>> Acute MI <<< Consistent with inferior MI Confirmed by Vanetta Mulders [phone]) on 01/28/2021 8:50:54 PM  Radiology CT Head Wo Contrast  Result Date: 01/28/2021 CLINICAL DATA:  Initial evaluation for acute head trauma, fall, STEMI. EXAM: CT HEAD WITHOUT CONTRAST TECHNIQUE: Contiguous axial images were obtained from the base of the skull through the vertex without intravenous contrast. COMPARISON: None available. FINDINGS: Brain: Cerebral volume within normal limits for age. Scattered patchy hypodensity involving the periventricular, deep, and subcortical white matter of both cerebral hemispheres,  most pronounced at the frontal lobes, nonspecific, but most like related chronic microvascular ischemic disease. No acute intracranial hemorrhage. No acute large vessel territory infarct. No mass lesion or midline shift. No hydrocephalus or extra-axial fluid collection. Vascular: No visible hyperdense vessel. Scattered vascular calcifications noted within the carotid siphons. Skull: Small soft tissue contusion present at the right frontal scalp. No calvarial fracture. Sinuses/Orbits: Globes and orbital soft tissues demonstrate no acute finding. Scattered mucosal thickening noted within the frontoethmoidal and sphenoid sinuses. No mastoid effusion. Other: None. IMPRESSION: 1. No acute intracranial abnormality. 2. Small right frontal scalp contusion. No calvarial fracture. 3. Patchy hypodensities involving the supratentorial cerebral white matter, nonspecific, but most commonly related to chronic microvascular ischemic disease.  Results discussed by telephone at the time of interpretation on 01/28/2021 at 8:37 pm to provider Vanetta Mulders , who verbally acknowledged these results. Electronically Signed   By: Rise Mu M.D.   On: 01/28/2021 20:48   DG Chest Portable 1 View  Result Date: 01/28/2021 CLINICAL DATA:  Status post fall with chest pain. EXAM: PORTABLE CHEST 1 VIEW COMPARISON:  October 04, 2016 FINDINGS: Mild hazy opacification is seen along the lateral aspect of the mid left lung. There is no evidence of a pleural effusion or pneumothorax. The heart size and mediastinal contours are within normal limits. The visualized skeletal structures are unremarkable. IMPRESSION: 1. Mild hazy opacification along the lateral aspect of the mid left lung, which may represent an area atelectasis and/or infiltrate. Given the patient's recent history of fall, an area of pulmonary contusion can not be excluded. Electronically Signed   By: Aram Candela M.D.   On: 01/28/2021 20:36    Procedures Procedures    Medications Ordered in ED Medications  midazolam (VERSED) injection (1 mg Intravenous Given 01/28/21 2044)  fentaNYL (SUBLIMAZE) injection (25 mcg Intravenous Given 01/28/21 2044)  lidocaine (PF) (XYLOCAINE) 1 % injection (2 mLs  Given 01/28/21 2047)  Heparin (Porcine) in NaCl 1000-0.9 UT/500ML-% SOLN (500 mLs  Given 01/28/21 2047)  Radial Cocktail/Verapamil only (10 mLs Intra-arterial Given 01/28/21 2049)  heparin sodium (porcine) injection (6,000 Units Intravenous Given 01/28/21 2050)  nitroGLYCERIN (NITROSTAT) 0.4 MG SL tablet (0.4 mg  Given 01/28/21 2021)    ED Course  I have reviewed the triage vital signs and the nursing notes.  Pertinent labs & imaging results that were available during my care of the patient were reviewed by me and considered in my medical decision making (see chart for details).    MDM Rules/Calculators/A&P                         CRITICAL CARE Performed by: Vanetta Mulders Total critical care time: 45 minutes Critical care time was exclusive of separately billable procedures and treating other patients. Critical care was necessary to treat or prevent imminent or life-threatening deterioration. Critical care was time spent personally by me on the following activities: development of treatment plan with patient and/or surrogate as well as nursing, discussions with consultants, evaluation of patient's response to treatment, examination of patient, obtaining history from patient or surrogate, ordering and performing treatments and interventions, ordering and review of laboratory studies, ordering and review of radiographic studies, pulse oximetry and re-evaluation of patient's condition.  Dr. Herbie Baltimore here from cardiology.  Patient went to CT scanner read by radiologist just to be sure there was no head trauma or injury.  Read as normal.  Patient taken up to the Cath Lab.  Code STEMI order set initiated.  Repeat EKG here consistent with inferior MI.  Electrolytes  significant for creatinine of 1.6.  Otherwise sodium potassium normal.  Glucose 163.  Hemoglobin 7.3.  Portable chest x-ray mild hazy opacification along the lateral aspect of the mid left lung which may represent an area of atelectasis or infiltrate given patient's recent history of fall and area of pulmonary contusion cannot be excluded.  However patient had no evidence of any chest trauma.    Final Clinical Impression(s) / ED Diagnoses Final diagnoses:  ST elevation myocardial infarction (STEMI), unspecified artery (HCC)  Injury of head, initial encounter    Rx / DC Orders ED Discharge Orders     None  Vanetta Mulders, MD 01/28/21 2051

## 2021-01-29 ENCOUNTER — Other Ambulatory Visit (HOSPITAL_COMMUNITY): Payer: Self-pay

## 2021-01-29 ENCOUNTER — Inpatient Hospital Stay (HOSPITAL_COMMUNITY): Payer: Self-pay

## 2021-01-29 ENCOUNTER — Other Ambulatory Visit: Payer: Self-pay

## 2021-01-29 ENCOUNTER — Encounter (HOSPITAL_COMMUNITY): Payer: Self-pay | Admitting: Cardiology

## 2021-01-29 DIAGNOSIS — I2119 ST elevation (STEMI) myocardial infarction involving other coronary artery of inferior wall: Secondary | ICD-10-CM

## 2021-01-29 LAB — CBC
HCT: 48.2 % (ref 39.0–52.0)
Hemoglobin: 15.4 g/dL (ref 13.0–17.0)
MCH: 29.7 pg (ref 26.0–34.0)
MCHC: 32 g/dL (ref 30.0–36.0)
MCV: 92.9 fL (ref 80.0–100.0)
Platelets: 213 10*3/uL (ref 150–400)
RBC: 5.19 MIL/uL (ref 4.22–5.81)
RDW: 14.5 % (ref 11.5–15.5)
WBC: 9.1 10*3/uL (ref 4.0–10.5)
nRBC: 0 % (ref 0.0–0.2)

## 2021-01-29 LAB — BASIC METABOLIC PANEL
Anion gap: 4 — ABNORMAL LOW (ref 5–15)
BUN: 9 mg/dL (ref 6–20)
CO2: 28 mmol/L (ref 22–32)
Calcium: 8.8 mg/dL — ABNORMAL LOW (ref 8.9–10.3)
Chloride: 106 mmol/L (ref 98–111)
Creatinine, Ser: 1.19 mg/dL (ref 0.61–1.24)
GFR, Estimated: >60 mL/min (ref >60)
Glucose, Bld: 139 mg/dL — ABNORMAL HIGH (ref 70–99)
Potassium: 4.3 mmol/L (ref 3.5–5.1)
Sodium: 138 mmol/L (ref 135–145)

## 2021-01-29 LAB — POCT ACTIVATED CLOTTING TIME
Activated Clotting Time: 219 seconds
Activated Clotting Time: 283 seconds

## 2021-01-29 LAB — TROPONIN I (HIGH SENSITIVITY)
Troponin I (High Sensitivity): >24000 ng/L (ref <18)
Troponin I (High Sensitivity): >24000 ng/L (ref <18)
Troponin I (High Sensitivity): >24000 ng/L (ref <18)

## 2021-01-29 LAB — ECHOCARDIOGRAM COMPLETE
Area-P 1/2: 2.82 cm2
S' Lateral: 3.4 cm
Weight: 2821.89 oz

## 2021-01-29 LAB — MRSA NEXT GEN BY PCR, NASAL: MRSA by PCR Next Gen: NOT DETECTED

## 2021-01-29 LAB — GLUCOSE, CAPILLARY: Glucose-Capillary: 98 mg/dL (ref 70–99)

## 2021-01-29 MED ORDER — CHLORHEXIDINE GLUCONATE CLOTH 2 % EX PADS
6.0000 | MEDICATED_PAD | Freq: Every day | CUTANEOUS | Status: DC
  Administered 2021-01-29 – 2021-01-30 (×2): 6 via TOPICAL

## 2021-01-29 MED ORDER — LOSARTAN POTASSIUM 50 MG PO TABS
50.0000 mg | ORAL_TABLET | Freq: Every day | ORAL | Status: DC
  Administered 2021-01-30: 50 mg via ORAL
  Filled 2021-01-29: qty 1

## 2021-01-29 MED ORDER — CARVEDILOL 6.25 MG PO TABS
6.2500 mg | ORAL_TABLET | Freq: Two times a day (BID) | ORAL | Status: DC
  Administered 2021-01-29 – 2021-01-30 (×2): 6.25 mg via ORAL
  Filled 2021-01-29 (×2): qty 1

## 2021-01-29 MED ORDER — ENOXAPARIN SODIUM 40 MG/0.4ML IJ SOSY
40.0000 mg | PREFILLED_SYRINGE | INTRAMUSCULAR | Status: DC
  Administered 2021-01-29 – 2021-01-30 (×2): 40 mg via SUBCUTANEOUS
  Filled 2021-01-29 (×2): qty 0.4

## 2021-01-29 MED ORDER — CARVEDILOL 3.125 MG PO TABS
3.1250 mg | ORAL_TABLET | Freq: Once | ORAL | Status: AC
  Administered 2021-01-29: 3.125 mg via ORAL
  Filled 2021-01-29: qty 1

## 2021-01-29 NOTE — Progress Notes (Signed)
CSW scheduled hospital follow up appointment at [facility].  First available appt: 03/18/21 at 8:50am. Daleen Squibb, MSW, LCSW 10/5/20223:31 PM

## 2021-01-29 NOTE — Discharge Instructions (Signed)

## 2021-01-29 NOTE — Care Management (Signed)
01-29-21 1254 Case Manager spoke with the patient regarding transition of care needs. Patient is from home with support of his brother. Patient states he lives in [place] and is willing to have Case Manager schedule him a hospital follow up appointment at one of the local clinics. Information will be placed on the AVS. Patient states he has transportation to all appointments. Case Manager will follow for medication needs- possible MATCH candidate.

## 2021-01-29 NOTE — Progress Notes (Signed)
Progress Note  Patient Name: Lawrence Pope Date of Encounter: 01/29/2021  [facility] Cardiologist: None New- Dr Herbie [place]  Subjective   Feels well. No chest pain or dyspnea  Inpatient Medications    Scheduled Meds:  aspirin  81 mg Oral Daily   atorvastatin  80 mg Oral Daily   carvedilol  3.125 mg Oral BID WC   irbesartan  75 mg Oral Daily   sodium chloride flush  3 mL Intravenous Q12H   ticagrelor  90 mg Oral BID   Continuous Infusions:  sodium chloride     PRN Meds: sodium chloride, acetaminophen, ondansetron (ZOFRAN) IV, oxyCODONE, sodium chloride flush   Vital Signs    Vitals:   01/29/21 0600 01/29/21 0630 01/29/21 0700 01/29/21 0804  BP: (!) 145/91  (!) 124/92   Pulse: 61 61 62   Resp: (!) 22 (!) 42 (!) 37   Temp:    97.8 F (36.6 C)  TempSrc:    Oral  SpO2: 98% 97% 98%   Weight:  80 kg      Intake/Output Summary (Last 24 hours) at 01/29/2021 0815 Last data filed at 01/29/2021 0600 Gross per 24 hour  Intake 866.47 ml  Output 500 ml  Net 366.47 ml   Last 3 Weights 01/29/2021 01/28/2021 12/09/2020  Weight (lbs) 176 lb 5.9 oz 180 lb 193 lb  Weight (kg) 80 kg 81.647 kg 87.544 kg      Telemetry    NSR with few brief runs NSVT - Personally Reviewed  ECG    NSR with resolution of inferior ST elevation. Q waves. T wave inversion inferolaterally - Personally Reviewed  Physical Exam   GEN: No acute distress.  Right frontal hematoma.  Neck: No JVD Cardiac: RRR, no murmurs, rubs, or gallops.  Respiratory: Clear to auscultation bilaterally. GI: Soft, nontender, non-distended  MS: No edema; No deformity. Neuro:  Nonfocal  Psych: Normal affect   Labs    High Sensitivity Troponin:   Recent Labs  Lab 01/28/21 2010 01/29/21 0416  TROPONINIHS 16 >24,000*     Chemistry Recent Labs  Lab 01/28/21 2010 01/28/21 2017 01/29/21 0129  NA 140 143 138  K 4.1 4.1 4.3  CL 108 106 106  CO2 25  --  28  GLUCOSE 167* 163* 139*  BUN 12 14 9   CREATININE  1.55* 1.60* 1.19  CALCIUM 9.5  --  8.8*  PROT 6.7  --   --   ALBUMIN 3.7  --   --   AST 22  --   --   ALT 16  --   --   ALKPHOS 77  --   --   BILITOT 0.2*  --   --   GFRNONAA 53*  --  >60  ANIONGAP 7  --  4*    Lipids  Recent Labs  Lab 01/28/21 2010  CHOL 92  TRIG 65  HDL 45  LDLCALC 34  CHOLHDL 2.0    Hematology Recent Labs  Lab 01/28/21 2010 01/28/21 2017 01/29/21 0129  WBC 8.6  --  9.1  RBC 5.24  --  5.19  HGB 15.8 17.3* 15.4  HCT 48.9 51.0 48.2  MCV 93.3  --  92.9  MCH 30.2  --  29.7  MCHC 32.3  --  32.0  RDW 14.3  --  14.5  PLT 245  --  213   Thyroid No results for input(s): TSH, FREET4 in the last 168 hours.  BNPNo results for input(s): BNP, PROBNP in  the last 168 hours.  DDimer No results for input(s): DDIMER in the last 168 hours.   Radiology    CT Head Wo Contrast  Result Date: 01/28/2021 CLINICAL DATA:  Initial evaluation for acute head trauma, fall, STEMI. EXAM: CT HEAD WITHOUT CONTRAST TECHNIQUE: Contiguous axial images were obtained from the base of the skull through the vertex without intravenous contrast. COMPARISON: None available. FINDINGS: Brain: Cerebral volume within normal limits for age. Scattered patchy hypodensity involving the periventricular, deep, and subcortical white matter of both cerebral hemispheres, most pronounced at the frontal lobes, nonspecific, but most like related chronic microvascular ischemic disease. No acute intracranial hemorrhage. No acute large vessel territory infarct. No mass lesion or midline shift. No hydrocephalus or extra-axial fluid collection. Vascular: No visible hyperdense vessel. Scattered vascular calcifications noted within the carotid siphons. Skull: Small soft tissue contusion present at the right frontal scalp. No calvarial fracture. Sinuses/Orbits: Globes and orbital soft tissues demonstrate no acute finding. Scattered mucosal thickening noted within the frontoethmoidal and sphenoid sinuses. No mastoid  effusion. Other: None. IMPRESSION: 1. No acute intracranial abnormality. 2. Small right frontal scalp contusion. No calvarial fracture. 3. Patchy hypodensities involving the supratentorial cerebral white matter, nonspecific, but most commonly related to chronic microvascular ischemic disease. Results discussed by telephone at the time of interpretation on 01/28/2021 at 8:37 pm to provider Vanetta Mulders , who verbally acknowledged these results. Electronically Signed   By: Rise Mu M.D.   On: 01/28/2021 20:48   CARDIAC CATHETERIZATION  Result Date: 01/28/2021   CULPRIT LESION prox RCA lesion is 100% stenosed.  (Thrombotic)   A drug-eluting stent was successfully placed using a STENT ONYX FRONTIER 4.0X34.-Deployed to 4.3 mm   Post intervention, there is a 0% residual stenosis.   Mid RCA lesion is 50% stenosed -> is downstream from the stent, with initial stent deployment, appeared to be only 30% (suspect some component of spasm)   ----------------------------------------------------------   Dist LAD lesion is 50% stenosed.  2nd Diag lesion is 40% stenosed.   Dist Cx lesion is 50% stenosed.   ----------------------------------------------------------   The left ventricular ejection fraction is 45-55% by visual estimate. SUMMARY Culprit Lesion for Acute Inferior STEMI: 100% thrombotic occlusion of proximal RCA at a [employer] --> Successful DES PCI of proximal to mid RCA 100% reduced to 0% (TIMI 0 to TIMI-3 flow): Onyx Frontier DES 4.0 mm x 34 mm deployed to 4.3 mm --> moderate 50% lesion downstream from occlusion Mild to moderate disease in the LAD, LCx and 2 Diag Branches - Plan Med Rx Preserved LVEF of 45 to 55% with basal to mid inferior hypokinesis and moderately elevated LVEDP of 16 mmHg. Reperfusion arrhythmia with profound bradycardia and hypotension Treated with one half amp of atropine x2, and 200 mL bolus.  Did not start dopamine. RECOMMENDATIONS Admit to CVICU for ongoing care.   With heavy thrombus burden started Aggrastat will continue infusion x2 hours TR band removal per protocol 2D echo in the morning Start low-dose carvedilol tonight 3.25 mg twice daily and irbesartan 75 mg a morning.  Hold home HCTZ. Atorvastatin 80 mg daily. DAPT times been 1 year minimum. Anticipate, if hemodynamic stable and EF is okay that he could potentially be fast track discharge. Bryan Lemma, MD  DG Chest Portable 1 View  Result Date: 01/28/2021 CLINICAL DATA:  Status post fall with chest pain. EXAM: PORTABLE CHEST 1 VIEW COMPARISON:  October 04, 2016 FINDINGS: Mild hazy opacification is seen along the lateral aspect of  the mid left lung. There is no evidence of a pleural effusion or pneumothorax. The heart size and mediastinal contours are within normal limits. The visualized skeletal structures are unremarkable. IMPRESSION: 1. Mild hazy opacification along the lateral aspect of the mid left lung, which may represent an area atelectasis and/or infiltrate. Given the patient's recent history of fall, an area of pulmonary contusion can not be excluded. Electronically Signed   By: Aram Candela M.D.   On: 01/28/2021 20:36    Cardiac Studies   Procedures  Coronary/Graft Acute MI Revascularization  LEFT HEART CATH AND CORONARY ANGIOGRAPHY   Conclusion      CULPRIT LESION prox RCA lesion is 100% stenosed.  (Thrombotic)   A drug-eluting stent was successfully placed using a STENT ONYX FRONTIER 4.0X34.-Deployed to 4.3 mm   Post intervention, there is a 0% residual stenosis.   Mid RCA lesion is 50% stenosed -> is downstream from the stent, with initial stent deployment, appeared to be only 30% (suspect some component of spasm)   ----------------------------------------------------------   Dist LAD lesion is 50% stenosed.  2nd Diag lesion is 40% stenosed.   Dist Cx lesion is 50% stenosed.   ----------------------------------------------------------   The left ventricular ejection fraction is  45-55% by visual estimate.   SUMMARY Culprit Lesion for Acute Inferior STEMI: 100% thrombotic occlusion of proximal RCA at a [employer] --> Successful DES PCI of proximal to mid RCA 100% reduced to 0% (TIMI 0 to TIMI-3 flow): Onyx Frontier DES 4.0 mm x 34 mm deployed to 4.3 mm --> moderate 50% lesion downstream from occlusion Mild to moderate disease in the LAD, LCx and 2 Diag Branches - Plan Med Rx Preserved LVEF of 45 to 55% with basal to mid inferior hypokinesis and moderately elevated LVEDP of 16 mmHg. Reperfusion arrhythmia with profound bradycardia and hypotension  Treated with one half amp of atropine x2, and 200 mL bolus.   Did not start dopamine.     RECOMMENDATIONS Admit to CVICU for ongoing care.  With heavy thrombus burden started Aggrastat will continue infusion x2 hours TR band removal per protocol 2D echo in the morning Start low-dose carvedilol tonight 3.25 mg twice daily and irbesartan 75 mg a morning.  Hold home HCTZ. Atorvastatin 80 mg daily. DAPT times been 1 year minimum. Anticipate, if hemodynamic stable and EF is okay that he could potentially be fast track discharge. Diagnostic Dominance: Right Intervention    Patient Profile     55 y.o. male with history of HTN, tobacco/marijuana use presents with acute inferior STEMI  Assessment & Plan    Acute inferior STEMI. Troponin >24K. S/p emergent stenting of proximal RCA with DES. On DAPT for one year. Beta blocker. Statin ( even though lipids looked great) . Will transfer to telemetry today and ambulate. Anticipate DC tomorrow. Will check Echo Tobacco/ marijuana use. Counseled on cessation.  Forehead hematoma secondary to fall. Negative head CT. No neurologic deficits. HTN. On ARB and Coreg now. Adjust as needed. 5.   AKI improved with creatine 1.60 to 1.19.    For questions or updates, please contact [facility] Please consult [email or web address] for contact info under        Signed, Katianna Mcclenney  [place], MD  01/29/2021, 8:15 AM

## 2021-01-29 NOTE — Progress Notes (Signed)
Echocardiogram 2D Echocardiogram has been performed.  Lawrence Pope 01/29/2021, 10:19 AM

## 2021-01-29 NOTE — Progress Notes (Signed)
CARDIAC REHAB PHASE I   PRE:  Rate/Rhythm: 50 SB    BP: sitting 129/82    SaO2: 99 RA  MODE:  Ambulation: 370 ft   POST:  Rate/Rhythm: 85 SR    BP: sitting 145/89     SaO2: 98 RA   Tolerated well, no c/o. To recliner. Began discussing MI, stent, restrictions, Brilinta, and smoking cessation. Pt receptive. He is contemplating quitting smoking. Gave resources and benefits of quitting. Pt would benefit from reiteration. Will f/u tomorrow. [id number]  Harriet Masson CES, ACSM 01/29/2021 11:34 AM

## 2021-01-30 ENCOUNTER — Other Ambulatory Visit (HOSPITAL_COMMUNITY): Payer: Self-pay

## 2021-01-30 DIAGNOSIS — E785 Hyperlipidemia, unspecified: Secondary | ICD-10-CM

## 2021-01-30 MED ORDER — LOSARTAN POTASSIUM 50 MG PO TABS
50.0000 mg | ORAL_TABLET | Freq: Every day | ORAL | 1 refills | Status: DC
  Filled 2021-01-30 – 2021-02-26 (×2): qty 30, 30d supply, fill #0
  Filled 2021-02-26: qty 30, 30d supply, fill #1

## 2021-01-30 MED ORDER — ATORVASTATIN CALCIUM 80 MG PO TABS
80.0000 mg | ORAL_TABLET | Freq: Every day | ORAL | 1 refills | Status: DC
  Filled 2021-01-30: qty 30, 30d supply, fill #0
  Filled 2021-02-26: qty 30, 30d supply, fill #1
  Filled 2021-02-26: qty 30, 30d supply, fill #0

## 2021-01-30 MED ORDER — TICAGRELOR 90 MG PO TABS
90.0000 mg | ORAL_TABLET | Freq: Two times a day (BID) | ORAL | 2 refills | Status: DC
  Filled 2021-01-30: qty 60, 30d supply, fill #0
  Filled 2021-02-26: qty 60, 30d supply, fill #1
  Filled 2021-02-26: qty 60, 30d supply, fill #0

## 2021-01-30 MED ORDER — CARVEDILOL 6.25 MG PO TABS
6.2500 mg | ORAL_TABLET | Freq: Two times a day (BID) | ORAL | 1 refills | Status: DC
  Filled 2021-01-30: qty 60, 30d supply, fill #0
  Filled 2021-02-26: qty 60, 30d supply, fill #1
  Filled 2021-02-26: qty 60, 30d supply, fill #0

## 2021-01-30 MED ORDER — ASPIRIN 81 MG PO CHEW
81.0000 mg | CHEWABLE_TABLET | Freq: Every day | ORAL | 2 refills | Status: DC
  Filled 2021-01-30: qty 30, 30d supply, fill #0

## 2021-01-30 MED ORDER — NITROGLYCERIN 0.4 MG SL SUBL
0.4000 mg | SUBLINGUAL_TABLET | SUBLINGUAL | 2 refills | Status: DC | PRN
Start: 2021-01-30 — End: 2022-01-28
  Filled 2021-01-30 – 2021-02-26 (×2): qty 25, 7d supply, fill #0
  Filled 2021-02-26: qty 25, 7d supply, fill #1

## 2021-01-30 NOTE — Progress Notes (Signed)
Progress Note  Patient Name: Lawrence Pope Date of Encounter: 01/30/2021  [facility] Cardiologist: None New- Dr Herbie [place]  Subjective   Feels well. No chest pain or dyspnea. Complains of pain where he hit his head  Inpatient Medications    Scheduled Meds:  aspirin  81 mg Oral Daily   atorvastatin  80 mg Oral Daily   carvedilol  6.25 mg Oral BID WC   Chlorhexidine Gluconate Cloth  6 each Topical Daily   enoxaparin (LOVENOX) injection  40 mg Subcutaneous Q24H   losartan  50 mg Oral Daily   sodium chloride flush  3 mL Intravenous Q12H   ticagrelor  90 mg Oral BID   Continuous Infusions:  sodium chloride     PRN Meds: sodium chloride, acetaminophen, ondansetron (ZOFRAN) IV, oxyCODONE, sodium chloride flush   Vital Signs    Vitals:   01/30/21 0400 01/30/21 0500 01/30/21 0600 01/30/21 0700  BP: 135/89 127/75 127/70 135/87  Pulse: (!) 55 63 (!) 48 61  Resp: 19 17 (!) 21 (!) 26  Temp:    98.2 F (36.8 C)  TempSrc:    Oral  SpO2: 99% 95% 100% 98%  Weight:        Intake/Output Summary (Last 24 hours) at 01/30/2021 0742 Last data filed at 01/29/2021 2100 Gross per 24 hour  Intake 120 ml  Output --  Net 120 ml    Last 3 Weights 01/29/2021 01/28/2021 12/09/2020  Weight (lbs) 176 lb 5.9 oz 180 lb 193 lb  Weight (kg) 80 kg 81.647 kg 87.544 kg      Telemetry    NSR since yesterday am. - Personally Reviewed  ECG    NSR with resolution of inferior ST elevation. Q waves. T wave inversion inferolaterally - Personally Reviewed  Physical Exam   GEN: No acute distress.  Right frontal hematoma.  Neck: No JVD Cardiac: RRR, no murmurs, rubs, or gallops.  Respiratory: Clear to auscultation bilaterally. GI: Soft, nontender, non-distended  MS: No edema; No deformity. Neuro:  Nonfocal  Psych: Normal affect   Labs    High Sensitivity Troponin:   Recent Labs  Lab 01/28/21 2010 01/29/21 0416 01/29/21 1009 01/29/21 1613  TROPONINIHS 16 >24,000* >24,000* >24,000*       Chemistry Recent Labs  Lab 01/28/21 2010 01/28/21 2017 01/29/21 0129  NA 140 143 138  K 4.1 4.1 4.3  CL 108 106 106  CO2 25  --  28  GLUCOSE 167* 163* 139*  BUN 12 14 9   CREATININE 1.55* 1.60* 1.19  CALCIUM 9.5  --  8.8*  PROT 6.7  --   --   ALBUMIN 3.7  --   --   AST 22  --   --   ALT 16  --   --   ALKPHOS 77  --   --   BILITOT 0.2*  --   --   GFRNONAA 53*  --  >60  ANIONGAP 7  --  4*     Lipids  Recent Labs  Lab 01/28/21 2010  CHOL 92  TRIG 65  HDL 45  LDLCALC 34  CHOLHDL 2.0     Hematology Recent Labs  Lab 01/28/21 2010 01/28/21 2017 01/29/21 0129  WBC 8.6  --  9.1  RBC 5.24  --  5.19  HGB 15.8 17.3* 15.4  HCT 48.9 51.0 48.2  MCV 93.3  --  92.9  MCH 30.2  --  29.7  MCHC 32.3  --  32.0  RDW 14.3  --  14.5  PLT 245  --  213    Thyroid No results for input(s): TSH, FREET4 in the last 168 hours.  BNPNo results for input(s): BNP, PROBNP in the last 168 hours.  DDimer No results for input(s): DDIMER in the last 168 hours.   Radiology    CT Head Wo Contrast  Result Date: 01/28/2021 CLINICAL DATA:  Initial evaluation for acute head trauma, fall, STEMI. EXAM: CT HEAD WITHOUT CONTRAST TECHNIQUE: Contiguous axial images were obtained from the base of the skull through the vertex without intravenous contrast. COMPARISON: None available. FINDINGS: Brain: Cerebral volume within normal limits for age. Scattered patchy hypodensity involving the periventricular, deep, and subcortical white matter of both cerebral hemispheres, most pronounced at the frontal lobes, nonspecific, but most like related chronic microvascular ischemic disease. No acute intracranial hemorrhage. No acute large vessel territory infarct. No mass lesion or midline shift. No hydrocephalus or extra-axial fluid collection. Vascular: No visible hyperdense vessel. Scattered vascular calcifications noted within the carotid siphons. Skull: Small soft tissue contusion present at the right frontal  scalp. No calvarial fracture. Sinuses/Orbits: Globes and orbital soft tissues demonstrate no acute finding. Scattered mucosal thickening noted within the frontoethmoidal and sphenoid sinuses. No mastoid effusion. Other: None. IMPRESSION: 1. No acute intracranial abnormality. 2. Small right frontal scalp contusion. No calvarial fracture. 3. Patchy hypodensities involving the supratentorial cerebral white matter, nonspecific, but most commonly related to chronic microvascular ischemic disease. Results discussed by telephone at the time of interpretation on 01/28/2021 at 8:37 pm to provider Vanetta Mulders , who verbally acknowledged these results. Electronically Signed   By: Rise Mu M.D.   On: 01/28/2021 20:48   CARDIAC CATHETERIZATION  Result Date: 01/28/2021   CULPRIT LESION prox RCA lesion is 100% stenosed.  (Thrombotic)   A drug-eluting stent was successfully placed using a STENT ONYX FRONTIER 4.0X34.-Deployed to 4.3 mm   Post intervention, there is a 0% residual stenosis.   Mid RCA lesion is 50% stenosed -> is downstream from the stent, with initial stent deployment, appeared to be only 30% (suspect some component of spasm)   ----------------------------------------------------------   Dist LAD lesion is 50% stenosed.  2nd Diag lesion is 40% stenosed.   Dist Cx lesion is 50% stenosed.   ----------------------------------------------------------   The left ventricular ejection fraction is 45-55% by visual estimate. SUMMARY Culprit Lesion for Acute Inferior STEMI: 100% thrombotic occlusion of proximal RCA at a [employer] --> Successful DES PCI of proximal to mid RCA 100% reduced to 0% (TIMI 0 to TIMI-3 flow): Onyx Frontier DES 4.0 mm x 34 mm deployed to 4.3 mm --> moderate 50% lesion downstream from occlusion Mild to moderate disease in the LAD, LCx and 2 Diag Branches - Plan Med Rx Preserved LVEF of 45 to 55% with basal to mid inferior hypokinesis and moderately elevated LVEDP of 16  mmHg. Reperfusion arrhythmia with profound bradycardia and hypotension Treated with one half amp of atropine x2, and 200 mL bolus.  Did not start dopamine. RECOMMENDATIONS Admit to CVICU for ongoing care.  With heavy thrombus burden started Aggrastat will continue infusion x2 hours TR band removal per protocol 2D echo in the morning Start low-dose carvedilol tonight 3.25 mg twice daily and irbesartan 75 mg a morning.  Hold home HCTZ. Atorvastatin 80 mg daily. DAPT times been 1 year minimum. Anticipate, if hemodynamic stable and EF is okay that he could potentially be fast track discharge. Bryan Lemma, MD  DG Chest Portable 1 View  Result Date:  01/28/2021 CLINICAL DATA:  Status post fall with chest pain. EXAM: PORTABLE CHEST 1 VIEW COMPARISON:  October 04, 2016 FINDINGS: Mild hazy opacification is seen along the lateral aspect of the mid left lung. There is no evidence of a pleural effusion or pneumothorax. The heart size and mediastinal contours are within normal limits. The visualized skeletal structures are unremarkable. IMPRESSION: 1. Mild hazy opacification along the lateral aspect of the mid left lung, which may represent an area atelectasis and/or infiltrate. Given the patient's recent history of fall, an area of pulmonary contusion can not be excluded. Electronically Signed   By: Aram Candela M.D.   On: 01/28/2021 20:36   ECHOCARDIOGRAM COMPLETE  Result Date: 01/29/2021    ECHOCARDIOGRAM REPORT   Patient Name:   Lawrence Pope Date of Exam: 01/29/2021 Medical Rec #:  [id number]       Height:       70.0 in Accession #:    [id number]      Weight:       176.4 lb Date of Birth:  [birth year]       BSA:          1.979 m Patient Age:    54 years        BP:           143/98 mmHg Patient Gender: M               HR:           56 bpm. Exam Location:  Inpatient Procedure: 2D Echo, Color Doppler and Cardiac Doppler Indications:    Acute myocardial infarction i21.9  History:        Patient has no prior history of  Echocardiogram examinations.                 CAD; Risk Factors:Hypertension.  Sonographer:    Irving Burton Senior RDCS Referring Phys: [phone] DAVID W HARDING IMPRESSIONS  1. Left ventricular ejection fraction, by estimation, is 55 to 60%. The left ventricle has normal function. The left ventricle demonstrates regional wall motion abnormalities (see scoring diagram/findings for description). There is severe asymmetric left ventricular hypertrophy of the basal-septal segment (measuring 1.8 cm). Left ventricular diastolic parameters are consistent with Grade I diastolic dysfunction (impaired relaxation). There is moderate hypokinesis of the left ventricular, basal inferior wall.  2. Right ventricular systolic function is low normal. The right ventricular size is normal.  3. Left atrial size was moderately dilated.  4. Mild elongation of the mitral anterior and posterior leaflets.  5. The mitral valve is abnormal. Trivial mitral valve regurgitation.  6. The aortic valve is tricuspid. Aortic valve regurgitation is not visualized.  7. Aortic dilatation noted. There is borderline dilatation of the aortic root, measuring 38 mm.  8. The inferior vena cava is normal in size with <50% respiratory variability, suggesting right atrial pressure of 8 mmHg. Comparison(s): No prior Echocardiogram. 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 No signfiicant LVOT obstruction was noted. Consider cMRI to further evaluate in the future. FINDINGS  Left Ventricle: Left ventricular ejection fraction, by estimation, is 55 to 60%. The left ventricle has normal function. The left ventricle demonstrates regional wall motion abnormalities. Moderate hypokinesis of the left ventricular, basal inferior wall. The left ventricular internal cavity size was normal in size. There is severe asymmetric left ventricular hypertrophy of the basal-septal segment. Left ventricular diastolic parameters are consistent  with Grade I diastolic dysfunction (impaired relaxation). Indeterminate filling pressures. Right Ventricle: The right ventricular size  is normal. No increase in right ventricular wall thickness. Right ventricular systolic function is low normal. Left Atrium: Left atrial size was moderately dilated. Right Atrium: Right atrial size was normal in size. Pericardium: There is no evidence of pericardial effusion. Mitral Valve: The mitral valve is abnormal. The anterior and posterior leaflets are mildly elongated. There is moderate thickening of the posterior and anterior mitral valve leaflet(s). Trivial mitral valve regurgitation. Tricuspid Valve: The tricuspid valve is grossly normal. Tricuspid valve regurgitation is trivial. Aortic Valve: The aortic valve is tricuspid. Aortic valve regurgitation is not visualized. Pulmonic Valve: The pulmonic valve was normal in structure. Pulmonic valve regurgitation is not visualized. Aorta: Aortic dilatation noted. There is borderline dilatation of the aortic root, measuring 38 mm. Venous: The inferior vena cava is normal in size with less than 50% respiratory variability, suggesting right atrial pressure of 8 mmHg. IAS/Shunts: No atrial level shunt detected by color flow Doppler.  LEFT VENTRICLE PLAX 2D LVIDd:         4.90 cm  Diastology LVIDs:         3.40 cm  LV e' medial:    6.20 cm/s LV PW:         1.00 cm  LV E/e' medial:  11.5 LV IVS:        1.80 cm  LV e' lateral:   6.42 cm/s LVOT diam:     2.60 cm  LV E/e' lateral: 11.1 LV SV:         176 LV SV Index:   89 LVOT Area:     5.31 cm  RIGHT VENTRICLE RV S prime:     9.03 cm/s TAPSE (M-mode): 1.6 cm LEFT ATRIUM             Index       RIGHT ATRIUM           Index LA diam:        3.80 cm 1.92 cm/m  RA Area:     15.00 cm LA Vol (A2C):   71.2 ml 35.98 ml/m RA Volume:   37.40 ml  18.90 ml/m LA Vol (A4C):   74.4 ml 37.60 ml/m LA Biplane Vol: 74.6 ml 37.70 ml/m  AORTIC VALVE LVOT Vmax:   169.00 cm/s LVOT Vmean:  131.000 cm/s LVOT  VTI:    0.331 m  AORTA Ao Root diam: 3.80 cm MITRAL VALVE MV Area (PHT): 2.82 cm    SHUNTS MV Decel Time: 269 msec    Systemic VTI:  0.33 m MV E velocity: 71.10 cm/s  Systemic Diam: 2.60 cm MV A velocity: 71.60 cm/s MV E/A ratio:  0.99 Zoila Shutter MD Electronically signed by Zoila Shutter MD Signature Date/Time: 01/29/2021/10:33:55 AM    Final     Cardiac Studies   Procedures  Coronary/Graft Acute MI Revascularization  LEFT HEART CATH AND CORONARY ANGIOGRAPHY   Conclusion      CULPRIT LESION prox RCA lesion is 100% stenosed.  (Thrombotic)   A drug-eluting stent was successfully placed using a STENT ONYX FRONTIER 4.0X34.-Deployed to 4.3 mm   Post intervention, there is a 0% residual stenosis.   Mid RCA lesion is 50% stenosed -> is downstream from the stent, with initial stent deployment, appeared to be only 30% (suspect some component of spasm)   ----------------------------------------------------------   Dist LAD lesion is 50% stenosed.  2nd Diag lesion is 40% stenosed.   Dist Cx lesion is 50% stenosed.   ----------------------------------------------------------   The left ventricular ejection fraction is  45-55% by visual estimate.   SUMMARY Culprit Lesion for Acute Inferior STEMI: 100% thrombotic occlusion of proximal RCA at a [employer] --> Successful DES PCI of proximal to mid RCA 100% reduced to 0% (TIMI 0 to TIMI-3 flow): Onyx Frontier DES 4.0 mm x 34 mm deployed to 4.3 mm --> moderate 50% lesion downstream from occlusion Mild to moderate disease in the LAD, LCx and 2 Diag Branches - Plan Med Rx Preserved LVEF of 45 to 55% with basal to mid inferior hypokinesis and moderately elevated LVEDP of 16 mmHg. Reperfusion arrhythmia with profound bradycardia and hypotension  Treated with one half amp of atropine x2, and 200 mL bolus.   Did not start dopamine.     RECOMMENDATIONS Admit to CVICU for ongoing care.  With heavy thrombus burden started Aggrastat will continue  infusion x2 hours TR band removal per protocol 2D echo in the morning Start low-dose carvedilol tonight 3.25 mg twice daily and irbesartan 75 mg a morning.  Hold home HCTZ. Atorvastatin 80 mg daily. DAPT times been 1 year minimum. Anticipate, if hemodynamic stable and EF is okay that he could potentially be fast track discharge. Diagnostic Dominance: Right Intervention   Echo : IMPRESSIONS     1. Left ventricular ejection fraction, by estimation, is 55 to 60%. The  left ventricle has normal function. The left ventricle demonstrates  regional wall motion abnormalities (see scoring diagram/findings for  description). There is severe asymmetric  left ventricular hypertrophy of the basal-septal segment (measuring 1.8  cm). Left ventricular diastolic parameters are consistent with Grade I  diastolic dysfunction (impaired relaxation). There is moderate hypokinesis  of the left ventricular, basal  inferior wall.   2. Right ventricular systolic function is low normal. The right  ventricular size is normal.   3. Left atrial size was moderately dilated.   4. Mild elongation of the mitral anterior and posterior leaflets.   5. The mitral valve is abnormal. Trivial mitral valve regurgitation.   6. The aortic valve is tricuspid. Aortic valve regurgitation is not  visualized.   7. Aortic dilatation noted. There is borderline dilatation of the aortic  root, measuring 38 mm.   8. The inferior vena cava is normal in size with <50% respiratory  variability, suggesting right atrial pressure of 8 mmHg.   Comparison(s): No prior Echocardiogram.   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 No signfiicant LVOT obstruction was noted.  Consider cMRI to further evaluate in the future.  Patient Profile     55 y.o. male with history of HTN, tobacco/marijuana use presents with acute inferior STEMI  Assessment & Plan    Acute inferior STEMI.  Troponin >24K. S/p emergent stenting of proximal RCA with DES. On DAPT for one year. Beta blocker. Statin ( even though lipids looked great) . Asymptomatic and ambulating in halls. EF 55-60% normal RV. Plan DC home today. Tobacco/ marijuana use. Counseled on cessation.  Forehead hematoma secondary to fall. Negative head CT. No neurologic deficits. HTN. On ARB and Coreg now. Adjust as needed. Switched to losartan for cost. 5.   AKI improved with creatine 1.60 to 1.19.    For questions or updates, please contact [facility] Please consult [email or web address] for contact info under        Signed, Terrez Ander [place], MD  01/30/2021, 7:42 AM

## 2021-01-30 NOTE — Discharge Summary (Addendum)
Discharge Summary    Patient ID: Lawrence Pope MRN: [id number]; DOB: Aug 03, 1965  Admit date: 01/28/2021 Discharge date: 01/30/2021  PCP:  Patient, No Pcp Per (Inactive)   [facility] Providers Cardiologist:  Bryan Lemma, MD   Discharge Diagnoses    Principal Problem:   Acute ST elevation myocardial infarction (STEMI) of inferior wall [facility]) Active Problems:   Essential hypertension   Marijuana abuse   Coronary artery disease involving native coronary artery of native heart with unstable angina pectoris (HCC)   Hyperlipidemia   Diagnostic Studies/Procedures    Cath: 01/28/21  CULPRIT LESION prox RCA lesion is 100% stenosed.  (Thrombotic)   A drug-eluting stent was successfully placed using a STENT ONYX FRONTIER 4.0X34.-Deployed to 4.3 mm   Post intervention, there is a 0% residual stenosis.   Mid RCA lesion is 50% stenosed -> is downstream from the stent, with initial stent deployment, appeared to be only 30% (suspect some component of spasm)   ----------------------------------------------------------   Dist LAD lesion is 50% stenosed.  2nd Diag lesion is 40% stenosed.   Dist Cx lesion is 50% stenosed.   ----------------------------------------------------------   The left ventricular ejection fraction is 45-55% by visual estimate.   SUMMARY Culprit Lesion for Acute Inferior STEMI: 100% thrombotic occlusion of proximal RCA at a [employer] --> Successful DES PCI of proximal to mid RCA 100% reduced to 0% (TIMI 0 to TIMI-3 flow): Onyx Frontier DES 4.0 mm x 34 mm deployed to 4.3 mm --> moderate 50% lesion downstream from occlusion Mild to moderate disease in the LAD, LCx and 2 Diag Branches - Plan Med Rx Preserved LVEF of 45 to 55% with basal to mid inferior hypokinesis and moderately elevated LVEDP of 16 mmHg. Reperfusion arrhythmia with profound bradycardia and hypotension  Treated with one half amp of atropine x2, and 200 mL bolus.   Did not start  dopamine.     RECOMMENDATIONS Admit to CVICU for ongoing care.  With heavy thrombus burden started Aggrastat will continue infusion x2 hours TR band removal per protocol 2D echo in the morning Start low-dose carvedilol tonight 3.25 mg twice daily and irbesartan 75 mg a morning.  Hold home HCTZ. Atorvastatin 80 mg daily. DAPT times been 1 year minimum. Anticipate, if hemodynamic stable and EF is okay that he could potentially be fast track discharge.   Bryan Lemma, MD  Diagnostic Dominance: Right Intervention    Echo: 01/29/21  IMPRESSIONS     1. Left ventricular ejection fraction, by estimation, is 55 to 60%. The  left ventricle has normal function. The left ventricle demonstrates  regional wall motion abnormalities (see scoring diagram/findings for  description). There is severe asymmetric  left ventricular hypertrophy of the basal-septal segment (measuring 1.8  cm). Left ventricular diastolic parameters are consistent with Grade I  diastolic dysfunction (impaired relaxation). There is moderate hypokinesis  of the left ventricular, basal  inferior wall.   2. Right ventricular systolic function is low normal. The right  ventricular size is normal.   3. Left atrial size was moderately dilated.   4. Mild elongation of the mitral anterior and posterior leaflets.   5. The mitral valve is abnormal. Trivial mitral valve regurgitation.   6. The aortic valve is tricuspid. Aortic valve regurgitation is not  visualized.   7. Aortic dilatation noted. There is borderline dilatation of the aortic  root, measuring 38 mm.   8. The inferior vena cava is normal in size with <50% respiratory  variability, suggesting right atrial  pressure of 8 mmHg.   Comparison(s): No prior Echocardiogram.   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 No signfiicant LVOT obstruction was noted.  Consider cMRI to further evaluate in the future.   _____________   History of Present Illness     Cadan Maggart is a 55 y.o. male with pmh sx for HTN who is being seen 01/28/2021 for the evaluation of STEMI. He was in his usual state of health till the day of admission when he was smoking marijuana; after which developed chest pain. He also became dizzy and lightheaded; this had been going on for few days; but the chest pain started an hour prior to presentation. EMS was called; and EKG showed inferior STEMI hence was brought here. Here he stated he has 8/10 chest pain; had a small injury on head; VS were stable with BP being slightly high. CT head was done in the ED which showed no acute ICH. Subsequently was taken to the cath lab emergently. Of note has not been his BP meds since few weeks per him. Cr today 1.6; up from 0.99.    Hospital Course     Acute inferior STEMI: Troponin peaked at >24K. Underwent emergent stenting of proximal RCA with DESx1. On DAPT with ASA/Brilinta for at least one year. Tolerated the addition of coreg 6.25mg  BID, losartan 50mg  daily and atorvastatin 80mg  daily. EF 55-60% normal RV, with severe LVH, g1DD, moderate hypokinesis in the basal inferior wall. No recurrent chest pain. Seen by CR. -- ASA, Brilinta, statin, coreg and losartan  Dyslipidemia: LDL 34 -- placed on atorvastatin 80mg  daily -- needs FLP/LFTs in 8 weeks  Tobacco/ marijuana use: Counseled on cessation.   Forehead hematoma secondary to fall:  Negative head CT. No neurologic deficits.  HTN: Tolerated the addition of losartan and coreg  AKI: peaked at 1.60, improved 1.19 -- BMET at follow up appt  Patient was seen by Dr. and deemed stable for discharge home. Follow up in the office has been arranged. Medications sent to the [facility] pharmacy. Educated by PharmD prior to discharge.   Did the patient have an acute coronary syndrome (MI, NSTEMI, STEMI, etc) this admission?:  Yes                               AHA/ACC Clinical Performance &  Quality Measures: Aspirin prescribed? - Yes ADP Receptor Inhibitor (Plavix/Clopidogrel, Brilinta/Ticagrelor or Effient/Prasugrel) prescribed (includes medically managed patients)? - Yes Beta Blocker prescribed? - Yes High Intensity Statin (Lipitor 40-80mg  or Crestor 20-40mg ) prescribed? - Yes EF assessed during THIS hospitalization? - Yes For EF <40%, was ACEI/ARB prescribed? - Yes For EF <40%, Aldosterone Antagonist (Spironolactone or Eplerenone) prescribed? - Not Applicable (EF >/= 40%) Cardiac Rehab Phase II ordered (including medically managed patients)? - Yes      _____________  Discharge Vitals Blood pressure 136/86, pulse (!) 58, temperature 98.2 F (36.8 C), temperature source Oral, resp. rate (!) 34, weight 80 kg, SpO2 96 %.  Filed Weights   01/28/21 2015 01/29/21 0630  Weight: 81.6 kg 80 kg    Labs & Radiologic Studies    CBC Recent Labs    01/28/21 2010 01/28/21 2017 01/29/21 0129  WBC 8.6  --  9.1  NEUTROABS 5.2  --   --   HGB 15.8 17.3* 15.4  HCT 48.9 51.0 48.2  MCV 93.3  --  92.9  PLT 245  --  213  Basic Metabolic Panel Recent Labs    73/22/02 2010 01/28/21 2017 01/29/21 0129  NA 140 143 138  K 4.1 4.1 4.3  CL 108 106 106  CO2 25  --  28  GLUCOSE 167* 163* 139*  BUN 12 14 9   CREATININE 1.55* 1.60* 1.19  CALCIUM 9.5  --  8.8*   Liver Function Tests Recent Labs    01/28/21 2010  AST 22  ALT 16  ALKPHOS 77  BILITOT 0.2*  PROT 6.7  ALBUMIN 3.7   No results for input(s): LIPASE, AMYLASE in the last 72 hours. High Sensitivity Troponin:   Recent Labs  Lab 01/28/21 2010 01/29/21 0416 01/29/21 1009 01/29/21 1613  TROPONINIHS 16 >24,000* >24,000* >24,000*    BNP Invalid input(s): POCBNP D-Dimer No results for input(s): DDIMER in the last 72 hours. Hemoglobin A1C Recent Labs    01/28/21 2247  HGBA1C 5.6   Fasting Lipid Panel Recent Labs    01/28/21 2010  CHOL 92  HDL 45  LDLCALC 34  TRIG 65  CHOLHDL 2.0   Thyroid Function  Tests No results for input(s): TSH, T4TOTAL, T3FREE, THYROIDAB in the last 72 hours.  Invalid input(s): FREET3 _____________  CT Head Wo Contrast  Result Date: 01/28/2021 CLINICAL DATA:  Initial evaluation for acute head trauma, fall, STEMI. EXAM: CT HEAD WITHOUT CONTRAST TECHNIQUE: Contiguous axial images were obtained from the base of the skull through the vertex without intravenous contrast. COMPARISON: None available. FINDINGS: Brain: Cerebral volume within normal limits for age. Scattered patchy hypodensity involving the periventricular, deep, and subcortical white matter of both cerebral hemispheres, most pronounced at the frontal lobes, nonspecific, but most like related chronic microvascular ischemic disease. No acute intracranial hemorrhage. No acute large vessel territory infarct. No mass lesion or midline shift. No hydrocephalus or extra-axial fluid collection. Vascular: No visible hyperdense vessel. Scattered vascular calcifications noted within the carotid siphons. Skull: Small soft tissue contusion present at the right frontal scalp. No calvarial fracture. Sinuses/Orbits: Globes and orbital soft tissues demonstrate no acute finding. Scattered mucosal thickening noted within the frontoethmoidal and sphenoid sinuses. No mastoid effusion. Other: None. IMPRESSION: 1. No acute intracranial abnormality. 2. Small right frontal scalp contusion. No calvarial fracture. 3. Patchy hypodensities involving the supratentorial cerebral white matter, nonspecific, but most commonly related to chronic microvascular ischemic disease. Results discussed by telephone at the time of interpretation on 01/28/2021 at 8:37 pm to provider 03/30/2021 , who verbally acknowledged these results. Electronically Signed   By: Vanetta Mulders M.D.   On: 01/28/2021 20:48   CARDIAC CATHETERIZATION  Result Date: 01/28/2021   CULPRIT LESION prox RCA lesion is 100% stenosed.  (Thrombotic)   A drug-eluting stent was  successfully placed using a STENT ONYX FRONTIER 4.0X34.-Deployed to 4.3 mm   Post intervention, there is a 0% residual stenosis.   Mid RCA lesion is 50% stenosed -> is downstream from the stent, with initial stent deployment, appeared to be only 30% (suspect some component of spasm)   ----------------------------------------------------------   Dist LAD lesion is 50% stenosed.  2nd Diag lesion is 40% stenosed.   Dist Cx lesion is 50% stenosed.   ----------------------------------------------------------   The left ventricular ejection fraction is 45-55% by visual estimate. SUMMARY Culprit Lesion for Acute Inferior STEMI: 100% thrombotic occlusion of proximal RCA at a [employer] 03/30/2021 --> Successful DES PCI of proximal to mid RCA 100% reduced to 0% (TIMI 0 to TIMI-3 flow): Onyx Frontier DES 4.0 mm x 34 mm deployed to 4.3  mm --> moderate 50% lesion downstream from occlusion Mild to moderate disease in the LAD, LCx and 2 Diag Branches - Plan Med Rx Preserved LVEF of 45 to 55% with basal to mid inferior hypokinesis and moderately elevated LVEDP of 16 mmHg. Reperfusion arrhythmia with profound bradycardia and hypotension Treated with one half amp of atropine x2, and 200 mL bolus.  Did not start dopamine. RECOMMENDATIONS Admit to CVICU for ongoing care.  With heavy thrombus burden started Aggrastat will continue infusion x2 hours TR band removal per protocol 2D echo in the morning Start low-dose carvedilol tonight 3.25 mg twice daily and irbesartan 75 mg a morning.  Hold home HCTZ. Atorvastatin 80 mg daily. DAPT times been 1 year minimum. Anticipate, if hemodynamic stable and EF is okay that he could potentially be fast track discharge. Bryan Lemma, MD  DG Chest Portable 1 View  Result Date: 01/28/2021 CLINICAL DATA:  Status post fall with chest pain. EXAM: PORTABLE CHEST 1 VIEW COMPARISON:  October 04, 2016 FINDINGS: Mild hazy opacification is seen along the lateral aspect of the mid left lung. There is no  evidence of a pleural effusion or pneumothorax. The heart size and mediastinal contours are within normal limits. The visualized skeletal structures are unremarkable. IMPRESSION: 1. Mild hazy opacification along the lateral aspect of the mid left lung, which may represent an area atelectasis and/or infiltrate. Given the patient's recent history of fall, an area of pulmonary contusion can not be excluded. Electronically Signed   By: Aram Candela M.D.   On: 01/28/2021 20:36   ECHOCARDIOGRAM COMPLETE  Result Date: 01/29/2021    ECHOCARDIOGRAM REPORT   Patient Name:   Lawrence Pope Date of Exam: 01/29/2021 Medical Rec #:  [id number]       Height:       70.0 in Accession #:    [id number]      Weight:       176.4 lb Date of Birth:  05/03/1965       BSA:          1.979 m Patient Age:    54 years        BP:           143/98 mmHg Patient Gender: M               HR:           56 bpm. Exam Location:  Inpatient Procedure: 2D Echo, Color Doppler and Cardiac Doppler Indications:    Acute myocardial infarction i21.9  History:        Patient has no prior history of Echocardiogram examinations.                 CAD; Risk Factors:Hypertension.  Sonographer:    Irving Burton Senior RDCS Referring Phys: [phone] DAVID W HARDING IMPRESSIONS  1. Left ventricular ejection fraction, by estimation, is 55 to 60%. The left ventricle has normal function. The left ventricle demonstrates regional wall motion abnormalities (see scoring diagram/findings for description). There is severe asymmetric left ventricular hypertrophy of the basal-septal segment (measuring 1.8 cm). Left ventricular diastolic parameters are consistent with Grade I diastolic dysfunction (impaired relaxation). There is moderate hypokinesis of the left ventricular, basal inferior wall.  2. Right ventricular systolic function is low normal. The right ventricular size is normal.  3. Left atrial size was moderately dilated.  4. Mild elongation of the mitral anterior and posterior  leaflets.  5. The mitral valve is abnormal. Trivial mitral valve regurgitation.  6. The  aortic valve is tricuspid. Aortic valve regurgitation is not visualized.  7. Aortic dilatation noted. There is borderline dilatation of the aortic root, measuring 38 mm.  8. The inferior vena cava is normal in size with <50% respiratory variability, suggesting right atrial pressure of 8 mmHg. Comparison(s): No prior Echocardiogram. 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 No signfiicant LVOT obstruction was noted. Consider cMRI to further evaluate in the future. FINDINGS  Left Ventricle: Left ventricular ejection fraction, by estimation, is 55 to 60%. The left ventricle has normal function. The left ventricle demonstrates regional wall motion abnormalities. Moderate hypokinesis of the left ventricular, basal inferior wall. The left ventricular internal cavity size was normal in size. There is severe asymmetric left ventricular hypertrophy of the basal-septal segment. Left ventricular diastolic parameters are consistent with Grade I diastolic dysfunction (impaired relaxation). Indeterminate filling pressures. Right Ventricle: The right ventricular size is normal. No increase in right ventricular wall thickness. Right ventricular systolic function is low normal. Left Atrium: Left atrial size was moderately dilated. Right Atrium: Right atrial size was normal in size. Pericardium: There is no evidence of pericardial effusion. Mitral Valve: The mitral valve is abnormal. The anterior and posterior leaflets are mildly elongated. There is moderate thickening of the posterior and anterior mitral valve leaflet(s). Trivial mitral valve regurgitation. Tricuspid Valve: The tricuspid valve is grossly normal. Tricuspid valve regurgitation is trivial. Aortic Valve: The aortic valve is tricuspid. Aortic valve regurgitation is not visualized. Pulmonic Valve: The pulmonic valve was normal in  structure. Pulmonic valve regurgitation is not visualized. Aorta: Aortic dilatation noted. There is borderline dilatation of the aortic root, measuring 38 mm. Venous: The inferior vena cava is normal in size with less than 50% respiratory variability, suggesting right atrial pressure of 8 mmHg. IAS/Shunts: No atrial level shunt detected by color flow Doppler.  LEFT VENTRICLE PLAX 2D LVIDd:         4.90 cm  Diastology LVIDs:         3.40 cm  LV e' medial:    6.20 cm/s LV PW:         1.00 cm  LV E/e' medial:  11.5 LV IVS:        1.80 cm  LV e' lateral:   6.42 cm/s LVOT diam:     2.60 cm  LV E/e' lateral: 11.1 LV SV:         176 LV SV Index:   89 LVOT Area:     5.31 cm  RIGHT VENTRICLE RV S prime:     9.03 cm/s TAPSE (M-mode): 1.6 cm LEFT ATRIUM             Index       RIGHT ATRIUM           Index LA diam:        3.80 cm 1.92 cm/m  RA Area:     15.00 cm LA Vol (A2C):   71.2 ml 35.98 ml/m RA Volume:   37.40 ml  18.90 ml/m LA Vol (A4C):   74.4 ml 37.60 ml/m LA Biplane Vol: 74.6 ml 37.70 ml/m  AORTIC VALVE LVOT Vmax:   169.00 cm/s LVOT Vmean:  131.000 cm/s LVOT VTI:    0.331 m  AORTA Ao Root diam: 3.80 cm MITRAL VALVE MV Area (PHT): 2.82 cm    SHUNTS MV Decel Time: 269 msec    Systemic VTI:  0.33 m MV E velocity: 71.10 cm/s  Systemic Diam: 2.60 cm MV A velocity: 71.60 cm/s MV  E/A ratio:  0.99 Zoila Shutter MD Electronically signed by Zoila Shutter MD Signature Date/Time: 01/29/2021/10:33:55 AM    Final    Disposition   Pt is being discharged home today in good condition.  Follow-up Plans & Appointments     Follow-up Information     [facility]. Go on 03/18/2021.   Specialty: Family Medicine Why: Please attend your appointment with Gwinda Passe FNP on Tuesday, March 18, 2021 at 8:50am.  Please ask about becoming an established patient at this clinic during this appointment. Contact information: [address] [place] [facility] [address] [phone]         Ronney Asters, NP Follow up on 02/18/2021.   Specialty: Cardiology Why: at 2:15pm for your follow up appt with Dr. Elissa Hefty NP Contact information: [address] [place] [address] [phone]                  Discharge Medications   Allergies as of 01/30/2021   No Known Allergies      Medication List     STOP taking these medications    oxyCODONE 5 MG immediate release tablet Commonly known as: Oxy IR/ROXICODONE       TAKE these medications    Aspirin Low Dose 81 MG chewable tablet Generic drug: aspirin Chew 1 tablet (81 mg total) by mouth daily. Start taking on: January 31, 2021   atorvastatin 80 MG tablet Commonly known as: LIPITOR Take 1 tablet (80 mg total) by mouth daily. Start taking on: January 31, 2021   Brilinta 90 MG Tabs tablet Generic drug: ticagrelor Take 1 tablet (90 mg total) by mouth 2 (two) times daily.   carvedilol 6.25 MG tablet Commonly known as: COREG Take 1 tablet (6.25 mg total) by mouth 2 (two) times daily with a meal.   loratadine 10 MG tablet Commonly known as: CLARITIN Take 10 mg by mouth daily as needed for allergies.   losartan 50 MG tablet Commonly known as: COZAAR Take 1 tablet (50 mg total) by mouth daily. Start taking on: January 31, 2021   nitroGLYCERIN 0.4 MG SL tablet Commonly known as: Nitrostat Place 1 tablet (0.4 mg total) under the tongue every 5 (five) minutes as needed.         Outstanding Labs/Studies   FLP/LFTs in 8 weeks  BMET at follow up  Duration of Discharge Encounter   Greater than 30 minutes including physician time.  Signed, Laverda Page, NP 01/30/2021, 12:54 PM

## 2021-01-30 NOTE — Care Management (Signed)
1224 01-30-21 MATCH completed for this patient. Medications will be delivered to the room from [facility]. No further needs from Case Manager at this time.

## 2021-01-30 NOTE — Progress Notes (Signed)
Pt d/c to home. AVS reviewed and given by this RN. Pt expressed understanding of information given, no questions at this time. Medications delivered to bedside by pharmacy. All belongings sent home with pt.

## 2021-01-30 NOTE — Progress Notes (Signed)
CARDIAC REHAB PHASE I   Pt sts he walked earlier, feeling well. Discussed diet, exercise, NTG, and CRPII. Pt receptive. Will refer to [facility] CRPII.  [id number]  Harriet Masson CES, ACSM 01/30/2021 11:12 AM

## 2021-02-03 ENCOUNTER — Other Ambulatory Visit: Payer: Self-pay

## 2021-02-03 ENCOUNTER — Emergency Department (HOSPITAL_COMMUNITY): Payer: Self-pay

## 2021-02-03 ENCOUNTER — Emergency Department (HOSPITAL_COMMUNITY)
Admission: EM | Admit: 2021-02-03 | Discharge: 2021-02-03 | Disposition: A | Discharge status: Home / Self Care | Payer: Self-pay | Attending: Emergency Medicine | Admitting: Emergency Medicine

## 2021-02-03 DIAGNOSIS — I25110 Atherosclerotic heart disease of native coronary artery with unstable angina pectoris: Secondary | ICD-10-CM | POA: Insufficient documentation

## 2021-02-03 DIAGNOSIS — R072 Precordial pain: Secondary | ICD-10-CM | POA: Insufficient documentation

## 2021-02-03 DIAGNOSIS — Z7982 Long term (current) use of aspirin: Secondary | ICD-10-CM | POA: Insufficient documentation

## 2021-02-03 DIAGNOSIS — I10 Essential (primary) hypertension: Secondary | ICD-10-CM | POA: Insufficient documentation

## 2021-02-03 DIAGNOSIS — R0602 Shortness of breath: Secondary | ICD-10-CM | POA: Insufficient documentation

## 2021-02-03 DIAGNOSIS — F17210 Nicotine dependence, cigarettes, uncomplicated: Secondary | ICD-10-CM | POA: Insufficient documentation

## 2021-02-03 DIAGNOSIS — Z79899 Other long term (current) drug therapy: Secondary | ICD-10-CM | POA: Insufficient documentation

## 2021-02-03 LAB — BASIC METABOLIC PANEL
Anion gap: 8 (ref 5–15)
BUN: 8 mg/dL (ref 6–20)
CO2: 24 mmol/L (ref 22–32)
Calcium: 8.9 mg/dL (ref 8.9–10.3)
Chloride: 107 mmol/L (ref 98–111)
Creatinine, Ser: 0.95 mg/dL (ref 0.61–1.24)
GFR, Estimated: >60 mL/min (ref >60)
Glucose, Bld: 120 mg/dL — ABNORMAL HIGH (ref 70–99)
Potassium: 3.6 mmol/L (ref 3.5–5.1)
Sodium: 139 mmol/L (ref 135–145)

## 2021-02-03 LAB — CBC
HCT: 49.2 % (ref 39.0–52.0)
Hemoglobin: 15.5 g/dL (ref 13.0–17.0)
MCH: 29.7 pg (ref 26.0–34.0)
MCHC: 31.5 g/dL (ref 30.0–36.0)
MCV: 94.3 fL (ref 80.0–100.0)
Platelets: 234 10*3/uL (ref 150–400)
RBC: 5.22 MIL/uL (ref 4.22–5.81)
RDW: 13.8 % (ref 11.5–15.5)
WBC: 8 10*3/uL (ref 4.0–10.5)
nRBC: 0 % (ref 0.0–0.2)

## 2021-02-03 LAB — BRAIN NATRIURETIC PEPTIDE: B Natriuretic Peptide: 207.2 pg/mL — ABNORMAL HIGH (ref 0.0–100.0)

## 2021-02-03 LAB — TROPONIN I (HIGH SENSITIVITY)
Troponin I (High Sensitivity): 637 ng/L (ref <18)
Troponin I (High Sensitivity): 475 ng/L (ref <18)

## 2021-02-03 MED FILL — Nitroglycerin IV Soln 100 MCG/ML in D5W: INTRA_ARTERIAL | Qty: 10 | Status: AC

## 2021-02-03 MED FILL — Dopamine in Dextrose 5% Inj 3.2 MG/ML: INTRAVENOUS | Qty: 250 | Status: AC

## 2021-02-03 NOTE — ED Triage Notes (Signed)
Pt arrived by [facility], having intermittent chest pain and weakness. Was seen here last week for MI and had stent placed. Reports etoh and cocaine use last night. Pt is diaphoretic at triage. Received 324mg  ASA and 1 nitro pta.

## 2021-02-03 NOTE — ED Provider Notes (Signed)
Emergency Medicine Provider Triage Evaluation Note  Lawrence Pope , a 55 y.o. male  was evaluated in triage.  Pt complains of achy chest pain that started 1 or 2 days ago he is uncertain of the exact onset time.  States it is nonradiating.  Uncertain of exertional or not. States that he had a heart attack last week does endorse alcohol and cocaine use as recently as last night.  Endorses being very sweaty having chest pain and having some nausea.  He received full dose aspirin and 1 nitroglycerin prior to arrival.  Review of Systems  Positive: Chest pain, diaphoresis, nausea Negative: Fever  Physical Exam  BP (!) 168/92 (BP Location: Right Arm)   Pulse (!) 58   Temp 97.8 F (36.6 C)   Resp 18   SpO2 99%  Gen:   Awake, no distress   Resp:  Normal effort  MSK:   Moves extremities without difficulty  Other:  55 year old male appears uncomfortable shirt is sweaty however patient is no longer diaphoretic.  Bilateral radial artery pulses are 3+ and symmetric.  Medical Decision Making  Medically screening exam initiated at 1:20 PM.  Appropriate orders placed.  Parthiv Mucci was informed that the remainder of the evaluation will be completed by another provider, this initial triage assessment does not replace that evaluation, and the importance of remaining in the ED until their evaluation is complete.  Patient with significant cardiac history also used cocaine and alcohol is having intermittent chest pain was diaphoretic earlier     Gailen Shelter, [place] 02/03/21 1322    Melene Plan, DO 02/04/21 [phone]

## 2021-02-03 NOTE — Discharge Instructions (Addendum)
Your test results today were reassuring.  Continue to take all of your medications as prescribed.  Follow-up with your cardiologist as scheduled.  It is important that you stop using illicit substances including cocaine and other stimulants as they will cause you to have chest pain and put additional strain on your heart.

## 2021-02-03 NOTE — ED Notes (Signed)
Critical lab Trop 637 reported to [place], [place]

## 2021-02-03 NOTE — ED Provider Notes (Signed)
[facility] EMERGENCY DEPARTMENT Provider Note   CSN: [id number] Arrival date & time: 02/03/21  1238     History Chief Complaint  Patient presents with   Chest Pain   Hypertension    Lawrence Pope is a 55 y.o. male.  HPI This is a 55 year old male with history of recent STEMI status post DES placement, hypertension, and hyperlipidemia who presents with chest pain.  Patient reports he was out last night drinking alcohol and doing cocaine when he experienced left-sided chest pain and shortness of breath.  Chest pain described as left-sided, nonradiating, intermittent, sharp, 6 out of 10 in severity.  Is associated with shortness of breath but is not associated with diaphoresis, nausea, or vomiting.  Patient reports compliance with all his medications including aspirin and Brilinta.    Past Medical History:  Diagnosis Date   Hx of migraines    Hypertension     Patient Active Problem List   Diagnosis Date Noted   Hyperlipidemia 01/30/2021   Acute ST elevation myocardial infarction (STEMI) of inferior wall (HCC) 01/28/2021   Essential hypertension 01/28/2021   Marijuana abuse 01/28/2021   Coronary artery disease involving native coronary artery of native heart with unstable angina pectoris (HCC)    Plantar fasciitis of left foot 05/03/2019    Past Surgical History:  Procedure Laterality Date   CORONARY/GRAFT ACUTE MI REVASCULARIZATION N/A 01/28/2021   Procedure: Coronary/Graft Acute MI Revascularization;  Surgeon: Marykay Lex, MD;  Location: [facility] INVASIVE CV LAB;  Service: Cardiovascular;  Laterality: N/A;   LEFT HEART CATH AND CORONARY ANGIOGRAPHY N/A 01/28/2021   Procedure: LEFT HEART CATH AND CORONARY ANGIOGRAPHY;  Surgeon: Marykay Lex, MD;  Location: [facility] INVASIVE CV LAB;  Service: Cardiovascular;  Laterality: N/A;      Family History  Problem Relation Age of Onset   Hypertension Mother    Heart attack Mother    Cancer Father    Hypertension  Father     Social History   Tobacco Use   Smoking status: Every Day    Packs/day: 1.00    Types: Cigarettes   Smokeless tobacco: Never  Substance Use Topics   Alcohol use: Yes    Alcohol/week: 8.0 standard drinks    Types: 8 Cans of beer per week    Comment: occ   Drug use: Not Currently    Home Medications Prior to Admission medications   Medication Sig Start Date End Date Taking? Authorizing Provider  aspirin 81 MG chewable tablet Chew 1 tablet (81 mg total) by mouth daily. 01/31/21  Yes Arty Baumgartner, NP  atorvastatin (LIPITOR) 80 MG tablet Take 1 tablet (80 mg total) by mouth daily. 01/31/21  Yes Arty Baumgartner, NP  carvedilol (COREG) 6.25 MG tablet Take 1 tablet (6.25 mg total) by mouth 2 (two) times daily with a meal. 01/30/21  Yes Arty Baumgartner, NP  loratadine (CLARITIN) 10 MG tablet Take 10 mg by mouth daily as needed for allergies.   Yes [provider]  losartan (COZAAR) 50 MG tablet Take 1 tablet (50 mg total) by mouth daily. 01/31/21  Yes Arty Baumgartner, NP  nitroGLYCERIN (NITROSTAT) 0.4 MG SL tablet Place 1 tablet (0.4 mg total) under the tongue every 5 (five) minutes as needed. 01/30/21  Yes Arty Baumgartner, NP  ticagrelor (BRILINTA) 90 MG TABS tablet Take 1 tablet (90 mg total) by mouth 2 (two) times daily. 01/30/21  Yes Arty Baumgartner, NP    Allergies  Patient has no known allergies.  Review of Systems   Review of Systems  Constitutional:  Negative for chills and fever.  HENT:  Negative for ear pain and sore throat.   Eyes:  Negative for pain and visual disturbance.  Respiratory:  Positive for shortness of breath. Negative for cough.   Cardiovascular:  Positive for chest pain. Negative for palpitations.  Gastrointestinal:  Negative for abdominal pain and vomiting.  Genitourinary:  Negative for dysuria and hematuria.  Musculoskeletal:  Negative for arthralgias and back pain.  Skin:  Negative for color change and rash.   Neurological:  Negative for seizures and syncope.  All other systems reviewed and are negative.  Physical Exam Updated Vital Signs BP (!) 175/97   Pulse 99   Temp 98.1 F (36.7 C) (Oral)   Resp 18   Ht 5\' 10"  (1.778 m)   Wt 82.6 kg   SpO2 93%   BMI 26.11 kg/m   Physical Exam Vitals and nursing note reviewed.  Constitutional:      Appearance: He is well-developed.  HENT:     Head: Normocephalic and atraumatic.  Eyes:     Conjunctiva/sclera: Conjunctivae normal.  Cardiovascular:     Rate and Rhythm: Normal rate and regular rhythm.     Heart sounds: No murmur heard. Pulmonary:     Effort: Pulmonary effort is normal. No respiratory distress.     Breath sounds: Normal breath sounds.  Abdominal:     Palpations: Abdomen is soft.     Tenderness: There is no abdominal tenderness.  Musculoskeletal:     Cervical back: Neck supple.     Right lower leg: No edema.     Left lower leg: No edema.  Skin:    General: Skin is warm and dry.  Neurological:     Mental Status: He is alert.    ED Results / Procedures / Treatments   Labs (all labs ordered are listed, but only abnormal results are displayed) Labs Reviewed  BASIC METABOLIC PANEL - Abnormal; Notable for the following components:      Result Value   Glucose, Bld 120 (*)    All other components within normal limits  BRAIN NATRIURETIC PEPTIDE - Abnormal; Notable for the following components:   B Natriuretic Peptide 207.2 (*)    All other components within normal limits  TROPONIN I (HIGH SENSITIVITY) - Abnormal; Notable for the following components:   Troponin I (High Sensitivity) 637 (*)    All other components within normal limits  TROPONIN I (HIGH SENSITIVITY) - Abnormal; Notable for the following components:   Troponin I (High Sensitivity) 475 (*)    All other components within normal limits  CBC    EKG EKG Interpretation  Date/Time:  Monday February 03 2021 13:08:33 EDT Ventricular Rate:  47 PR  Interval:  166 QRS Duration: 96 QT Interval:  496 QTC Calculation: 438 R Axis:   5 Text Interpretation: Sinus bradycardia with sinus arrhythmia Minimal voltage criteria for LVH, may be normal variant ( Sokolow-Lyon ) Cannot rule out Inferior infarct , age undetermined Anteroseptal infarct , age undetermined similar to Jan 29 2021 Confirmed by 02-07-1984 [phone]) on 02/03/2021 4:34:23 PM  Radiology DG Chest 2 View  Result Date: 02/03/2021 CLINICAL DATA:  Chest pain. EXAM: CHEST - 2 VIEW COMPARISON:  January 28, 2021. FINDINGS: The heart size and mediastinal contours are within normal limits. Both lungs are clear. The visualized skeletal structures are unremarkable. IMPRESSION: No active cardiopulmonary disease. Electronically Signed  By: Lupita Raider M.D.   On: 02/03/2021 14:15    Procedures Procedures   Medications Ordered in ED Medications - No data to display  ED Course  I have reviewed the triage vital signs and the nursing notes.  Pertinent labs & imaging results that were available during my care of the patient were reviewed by me and considered in my medical decision making (see chart for details).    MDM Rules/Calculators/A&P                           Patient is stable and well-appearing.  Exam is unremarkable.  There is no lower extremity edema, JVD, or crackles on lung exam and BNP only 200.  Patient appears comfortable.  EKG with ST elevations in the anterior leads but without any reciprocal depressions and is consistent with his EKG at discharge on 10/5.  Initial troponin elevated at 637 but trends downward from most recent troponin registered was greater than 24,000 on 10/5 at time of discharge.  Repeat troponin continues to trend downward to 475.  Based on EKG, labs, and exam, low suspicion for papillary muscle or free wall rupture, ventricular aneurysm, or Dressler syndrome. CXR without evidence of pneumonia, pneumothorax, pulmonary edeam or other cardiopulmonary  abnormality. CBC without anemia. BMP unremarkable.   Low suspicion for PE given patient without tachycardia, hypoxia, or signs of DVT. No pleuritic CP or ongoing SOB.   On reassessment patient is asymptomatic.   Given patient's recent procedure did speak with cardiology who agrees the patient is at low risk of these complications and believes chest pain is likely in the setting of patient's admitted cocaine use.  He is stable for discharge home with routine cardiology follow-up.  Given strict return precautions.  Advised against use of illicit substances and encouraged compliance with patient's medication regimen.  He expressed understanding.  Discharged home in stable condition.   Final Clinical Impression(s) / ED Diagnoses Final diagnoses:  Precordial pain    Rx / DC Orders ED Discharge Orders     None        Doran Clay, MD 02/04/21 [id number]    Pricilla Loveless, MD 02/07/21 [phone]

## 2021-02-07 ENCOUNTER — Other Ambulatory Visit: Payer: Self-pay

## 2021-02-07 DIAGNOSIS — Z006 Encounter for examination for normal comparison and control in clinical research program: Secondary | ICD-10-CM

## 2021-02-07 NOTE — Research (Signed)
Subject # [id number] [employer] Lp(a) [id number] Site # [phone]  SEX _0    Male                      _1    Male  Ethnicity _2   Hispanic or Latino   _3   Not Hispanic or Latino  Race _4   White                 _5   Black or African American  _6   Asian _7   American [place] or [place] Native            _8   Native Hawaiian or Other Pacific Islander                      _9   Other  Other   Age 55  Subject Group _10    Local Lab           _11   Historical Lp(a) value                                       Results:  Future research _12    Yes                    _13   No    [employer] [id number] Site # [id number] Subject ID # [id number]         ELIGIBILITY CRITERIA WORKSHEET INCLUSION CRITERIA   Subject has provided informed consent prior to the initiation of any study specific activities/procedures _14   Age 58 to 4 years _15   MI (presumed type 1) OR _16   PCI (with high-risk features) with at least 1 of the following: _17   Age >65 _18   Diabetes mellitus  HbA1c: _19   History of ischemic stroke _20   History of peripheral arterial disease _21   Residual stenosis ? 50% _22   Multivessel PCI (ie, ? 2 vessels, including branch arteries _23   EXCLUSIONS THE FOLLOWING N/A _24   Subjects known to be currently receiving investigational drug in a clinical study that is anticipated to last > 1 year _25   Known Lp(a) value <39m/dL or < 200nmol/L _26   Subject has a diagnosis of end-stage renal disease or requires dialysis. _27   Poorly controlled (glycated hemoglobin [HbA1c] > 10%) diabetes mellitus (type 1 or type 2) _28   Subject is receiving or has received lipoprotein apheresis to reduce Lp(a) within 3 months prior to enrollment. _29   Known uncontrolled or recurrent ventricular tachycardia in the past 3 months prior to enrollment. _30   Known malignancy (except non-melanoma skin cancers, cervical in situ carcinoma, breast ductal carcinoma in situ, or stage 1 prostate carcinoma) within the last 5 years prior to enrollment. _31   Known  history or evidence of clinically significant disease (eg, respiratory, gastrointestinal, or psychiatric disease) or unstable disorder or biomarker that, in the opinion of the investigator(s), would result in life expectancy < 5 years. _32   Known hemorrhagic stroke. _33        [employer] Lp(a) Informed Consent   Subject Name: Lawrence Pope Subject met inclusion and exclusion criteria.  The informed consent form, study requirements and expectations were reviewed with the subject and questions and concerns were addressed prior to the signing of the consent form.  The subject verbalized understanding of the trial requirements.  The subject agreed to participate in the [facility] trial and signed the informed consent at 09:30 on October 14,2022.  The informed consent  was obtained prior to performance of any protocol-specific procedures for the subject.  A copy of the signed informed consent was given to the subject and a copy was placed in the subject's medical record.   [name]  [employer]mg[employer] Consent Version 2 Protocol Version 2

## 2021-02-08 LAB — LIPOPROTEIN A (LPA): Lipoprotein (a): 76.6 nmol/L — ABNORMAL HIGH (ref <75.0)

## 2021-02-16 NOTE — Progress Notes (Deleted)
Cardiology Clinic Note   Patient Name: Lawrence Pope Date of Encounter: 02/16/2021  Primary Care Provider:  Patient, No Pcp Per (Inactive) Primary Cardiologist:  Bryan Lemma, MD  Patient Profile    Lawrence Pope 55 year old male presents the clinic today for an evaluation of his chest discomfort.  Past Medical History    Past Medical History:  Diagnosis Date   Hx of migraines    Hypertension    Past Surgical History:  Procedure Laterality Date   CORONARY/GRAFT ACUTE MI REVASCULARIZATION N/A 01/28/2021   Procedure: Coronary/Graft Acute MI Revascularization;  Surgeon: Marykay Lex, MD;  Location: [facility] INVASIVE CV LAB;  Service: Cardiovascular;  Laterality: N/A;   LEFT HEART CATH AND CORONARY ANGIOGRAPHY N/A 01/28/2021   Procedure: LEFT HEART CATH AND CORONARY ANGIOGRAPHY;  Surgeon: Marykay Lex, MD;  Location: [facility] INVASIVE CV LAB;  Service: Cardiovascular;  Laterality: N/A;    Allergies  No Known Allergies  History of Present Illness    Lawrence Pope has a PMH of coronary artery disease status post PCI with DES to his RCA 01/28/2021.  Home Medications    Prior to Admission medications   Medication Sig Start Date End Date Taking? Authorizing Provider  aspirin 81 MG chewable tablet Chew 1 tablet (81 mg total) by mouth daily. 01/31/21   Arty Baumgartner, NP  atorvastatin (LIPITOR) 80 MG tablet Take 1 tablet (80 mg total) by mouth daily. 01/31/21   Arty Baumgartner, NP  carvedilol (COREG) 6.25 MG tablet Take 1 tablet (6.25 mg total) by mouth 2 (two) times daily with a meal. 01/30/21   Arty Baumgartner, NP  loratadine (CLARITIN) 10 MG tablet Take 10 mg by mouth daily as needed for allergies.    [provider]  losartan (COZAAR) 50 MG tablet Take 1 tablet (50 mg total) by mouth daily. 01/31/21   Arty Baumgartner, NP  nitroGLYCERIN (NITROSTAT) 0.4 MG SL tablet Place 1 tablet (0.4 mg total) under the tongue every 5 (five) minutes as needed. 01/30/21    Arty Baumgartner, NP  ticagrelor (BRILINTA) 90 MG TABS tablet Take 1 tablet (90 mg total) by mouth 2 (two) times daily. 01/30/21   Arty Baumgartner, NP    Family History    Family History  Problem Relation Age of Onset   Hypertension Mother    Heart attack Mother    Cancer Father    Hypertension Father    He indicated that his mother is alive. He indicated that his father is deceased.  Social History    Social History   Socioeconomic History   Marital status: Legally Separated    Spouse name: Not on file   Number of children: Not on file   Years of education: Not on file   Highest education level: Not on file  Occupational History   Not on file  Tobacco Use   Smoking status: Every Day    Packs/day: 1.00    Types: Cigarettes   Smokeless tobacco: Never  Substance and Sexual Activity   Alcohol use: Yes    Alcohol/week: 8.0 standard drinks    Types: 8 Cans of beer per week    Comment: occ   Drug use: Not Currently   Sexual activity: Not on file  Other Topics Concern   Not on file  Social History Narrative   Not on file   Social Determinants of Health   Financial Resource Strain: Not on file  Food Insecurity: Not on file  Transportation  Needs: Not on file  Physical Activity: Not on file  Stress: Not on file  Social Connections: Not on file  Intimate Partner Violence: Not on file     Review of Systems    General:  No chills, fever, night sweats or weight changes.  Cardiovascular:  No chest pain, dyspnea on exertion, edema, orthopnea, palpitations, paroxysmal nocturnal dyspnea. Dermatological: No rash, lesions/masses Respiratory: No cough, dyspnea Urologic: No hematuria, dysuria Abdominal:   No nausea, vomiting, diarrhea, bright red blood per rectum, melena, or hematemesis Neurologic:  No visual changes, wkns, changes in mental status. All other systems reviewed and are otherwise negative except as noted above.  Physical Exam    VS:  There were no  vitals taken for this visit. , BMI There is no height or weight on file to calculate BMI. GEN: Well nourished, well developed, in no acute distress. HEENT: normal. Neck: Supple, no JVD, carotid bruits, or masses. Cardiac: RRR, no murmurs, rubs, or gallops. No clubbing, cyanosis, edema.  Radials/DP/PT 2+ and equal bilaterally.  Respiratory:  Respirations regular and unlabored, clear to auscultation bilaterally. GI: Soft, nontender, nondistended, BS + x 4. MS: no deformity or atrophy. Skin: warm and dry, no rash. Neuro:  Strength and sensation are intact. Psych: Normal affect.  Accessory Clinical Findings    Recent Labs: 01/28/2021: ALT 16 02/03/2021: B Natriuretic Peptide 207.2; BUN 8; Creatinine, Ser 0.95; Hemoglobin 15.5; Platelets 234; Potassium 3.6; Sodium 139   Recent Lipid Panel    Component Value Date/Time   CHOL 92 01/28/2021 2010   TRIG 65 01/28/2021 2010   HDL 45 01/28/2021 2010   CHOLHDL 2.0 01/28/2021 2010   VLDL 13 01/28/2021 2010   LDLCALC 34 01/28/2021 2010    ECG personally reviewed by me today- *** - No acute changes  Echocardiogram 01/29/2021  IMPRESSIONS     1. Left ventricular ejection fraction, by estimation, is 55 to 60%. The  left ventricle has normal function. The left ventricle demonstrates  regional wall motion abnormalities (see scoring diagram/findings for  description). There is severe asymmetric  left ventricular hypertrophy of the basal-septal segment (measuring 1.8  cm). Left ventricular diastolic parameters are consistent with Grade I  diastolic dysfunction (impaired relaxation). There is moderate hypokinesis  of the left ventricular, basal  inferior wall.   2. Right ventricular systolic function is low normal. The right  ventricular size is normal.   3. Left atrial size was moderately dilated.   4. Mild elongation of the mitral anterior and posterior leaflets.   5. The mitral valve is abnormal. Trivial mitral valve regurgitation.   6.  The aortic valve is tricuspid. Aortic valve regurgitation is not  visualized.   7. Aortic dilatation noted. There is borderline dilatation of the aortic  root, measuring 38 mm.   8. The inferior vena cava is normal in size with <50% respiratory  variability, suggesting right atrial pressure of 8 mmHg.   Comparison(s): No prior Echocardiogram.   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 No signfiicant LVOT obstruction was noted.  Consider cMRI to further evaluate in the future.   Cardiac catheterization 01/28/2021    CULPRIT LESION prox RCA lesion is 100% stenosed.  (Thrombotic)   A drug-eluting stent was successfully placed using a STENT ONYX FRONTIER [id number].-Deployed to 4.3 mm   Post intervention, there is a 0% residual stenosis.   Mid RCA lesion is 50% stenosed -> is downstream from the stent, with initial stent deployment, appeared to be only 30% (  suspect some component of spasm)   ----------------------------------------------------------   Dist LAD lesion is 50% stenosed.  2nd Diag lesion is 40% stenosed.   Dist Cx lesion is 50% stenosed.   ----------------------------------------------------------   The left ventricular ejection fraction is 45-55% by visual estimate.   SUMMARY Culprit Lesion for Acute Inferior STEMI: 100% thrombotic occlusion of proximal RCA at a [employer] --> Successful DES PCI of proximal to mid RCA 100% reduced to 0% (TIMI 0 to TIMI-3 flow): Onyx Frontier DES 4.0 mm x 34 mm deployed to 4.3 mm --> moderate 50% lesion downstream from occlusion Mild to moderate disease in the LAD, LCx and 2 Diag Branches - Plan Med Rx Preserved LVEF of 45 to 55% with basal to mid inferior hypokinesis and moderately elevated LVEDP of 16 mmHg. Reperfusion arrhythmia with profound bradycardia and hypotension  Treated with one half amp of atropine x2, and 200 mL bolus.   Did not start dopamine.      RECOMMENDATIONS Admit to CVICU for ongoing care.  With heavy thrombus burden started Aggrastat will continue infusion x2 hours TR band removal per protocol 2D echo in the morning Start low-dose carvedilol tonight 3.25 mg twice daily and irbesartan 75 mg a morning.  Hold home HCTZ. Atorvastatin 80 mg daily. DAPT times been 1 year minimum. Anticipate, if hemodynamic stable and EF is okay that he could potentially be fast track discharge.       Bryan Lemma, MD Diagnostic Dominance: Right Intervention   Assessment & Plan   1.  STEMI-no chest pain today.  Underwent cardiac catheterization 01/28/2021 with PCI and DES to his RCA.  Presented to emergency department 02/07/2021 with complaints of chest discomfort which was felt to be precordial type chest pain.  He reported compliance with his aspirin and Brilinta at that time.  EKG showed sinus bradycardia with sinus arrhythmia which was consistent with EKG at discharge.  Initial troponins elevated at 637.  Repeat troponin downtrend to 475.  Labs unremarkable. Continue Brilinta, atorvastatin, carvedilol, losartan, ventricle syndrome, aspirin Heart healthy low-sodium diet-salty 6 given Increase physical activity as tolerated  Hyperlipidemia-01/28/2021: Cholesterol 92; HDL 45; LDL Cholesterol 34; Triglycerides 65; VLDL 13 Continue atorvastatin Heart healthy low-sodium diet-salty 6 given Increase physical activity as tolerated  Essential hypertension-BP today***.  Well-controlled at home. Continue carvedilol, losartan Heart healthy low-sodium diet-salty 6 given Increase physical activity as tolerated  Disposition: Follow-up with Dr. Herbie Baltimore in 3 months.  Thomasene Ripple. Ariza Evans NP-C    02/16/2021, 4:10 PM [facility] [address] Office [phone] Fax [phone]  Notice: This dictation was prepared with Dragon dictation along with smaller phrase technology. Any transcriptional errors that result  from this process are unintentional and may not be corrected upon review.  I spent***minutes examining this patient, reviewing medications, and using patient centered shared decision making involving her cardiac care.  Prior to her visit I spent greater than 20 minutes reviewing her past medical history,  medications, and prior cardiac tests.

## 2021-02-18 ENCOUNTER — Ambulatory Visit: Payer: Self-pay | Admitting: General Practice

## 2021-02-26 ENCOUNTER — Other Ambulatory Visit: Payer: Self-pay

## 2021-02-26 ENCOUNTER — Other Ambulatory Visit (HOSPITAL_COMMUNITY): Payer: Self-pay

## 2021-03-03 ENCOUNTER — Other Ambulatory Visit: Payer: Self-pay

## 2021-03-18 ENCOUNTER — Ambulatory Visit (INDEPENDENT_AMBULATORY_CARE_PROVIDER_SITE_OTHER): Payer: Self-pay | Admitting: Primary Care

## 2021-08-23 ENCOUNTER — Encounter (HOSPITAL_COMMUNITY): Payer: Self-pay | Admitting: Emergency Medicine

## 2021-08-23 ENCOUNTER — Emergency Department (HOSPITAL_COMMUNITY): Payer: Self-pay

## 2021-08-23 ENCOUNTER — Telehealth (HOSPITAL_COMMUNITY): Payer: Self-pay | Admitting: Emergency Medicine

## 2021-08-23 ENCOUNTER — Emergency Department (HOSPITAL_COMMUNITY)
Admission: EM | Admit: 2021-08-23 | Discharge: 2021-08-23 | Disposition: A | Discharge status: Home / Self Care | Payer: Self-pay | Attending: Emergency Medicine | Admitting: Emergency Medicine

## 2021-08-23 ENCOUNTER — Other Ambulatory Visit: Payer: Self-pay

## 2021-08-23 DIAGNOSIS — R079 Chest pain, unspecified: Secondary | ICD-10-CM

## 2021-08-23 DIAGNOSIS — F17210 Nicotine dependence, cigarettes, uncomplicated: Secondary | ICD-10-CM | POA: Insufficient documentation

## 2021-08-23 DIAGNOSIS — R0602 Shortness of breath: Secondary | ICD-10-CM | POA: Insufficient documentation

## 2021-08-23 DIAGNOSIS — Z7982 Long term (current) use of aspirin: Secondary | ICD-10-CM | POA: Insufficient documentation

## 2021-08-23 DIAGNOSIS — R0789 Other chest pain: Secondary | ICD-10-CM | POA: Insufficient documentation

## 2021-08-23 DIAGNOSIS — R062 Wheezing: Secondary | ICD-10-CM | POA: Insufficient documentation

## 2021-08-23 DIAGNOSIS — Z79899 Other long term (current) drug therapy: Secondary | ICD-10-CM | POA: Insufficient documentation

## 2021-08-23 DIAGNOSIS — F1490 Cocaine use, unspecified, uncomplicated: Secondary | ICD-10-CM | POA: Insufficient documentation

## 2021-08-23 DIAGNOSIS — I10 Essential (primary) hypertension: Secondary | ICD-10-CM | POA: Insufficient documentation

## 2021-08-23 HISTORY — DX: Acute myocardial infarction, unspecified: I21.9

## 2021-08-23 LAB — D-DIMER, QUANTITATIVE: D-Dimer, Quant: 0.78 ug/mL-FEU — ABNORMAL HIGH (ref 0.00–0.50)

## 2021-08-23 LAB — BASIC METABOLIC PANEL
Anion gap: 6 (ref 5–15)
BUN: 9 mg/dL (ref 6–20)
CO2: 24 mmol/L (ref 22–32)
Calcium: 9 mg/dL (ref 8.9–10.3)
Chloride: 112 mmol/L — ABNORMAL HIGH (ref 98–111)
Creatinine, Ser: 0.92 mg/dL (ref 0.61–1.24)
GFR, Estimated: >60 mL/min (ref >60)
Glucose, Bld: 103 mg/dL — ABNORMAL HIGH (ref 70–99)
Potassium: 3.8 mmol/L (ref 3.5–5.1)
Sodium: 142 mmol/L (ref 135–145)

## 2021-08-23 LAB — CBC
HCT: 48.8 % (ref 39.0–52.0)
Hemoglobin: 15.7 g/dL (ref 13.0–17.0)
MCH: 29.2 pg (ref 26.0–34.0)
MCHC: 32.2 g/dL (ref 30.0–36.0)
MCV: 90.9 fL (ref 80.0–100.0)
Platelets: 206 10*3/uL (ref 150–400)
RBC: 5.37 MIL/uL (ref 4.22–5.81)
RDW: 13.9 % (ref 11.5–15.5)
WBC: 4.5 10*3/uL (ref 4.0–10.5)
nRBC: 0 % (ref 0.0–0.2)

## 2021-08-23 LAB — TROPONIN I (HIGH SENSITIVITY)
Troponin I (High Sensitivity): 16 ng/L (ref <18)
Troponin I (High Sensitivity): 13 ng/L (ref <18)

## 2021-08-23 MED ORDER — CARVEDILOL 6.25 MG PO TABS: 6.2500 mg | ORAL_TABLET | Freq: Two times a day (BID) | ORAL | 0 refills | Status: DC

## 2021-08-23 MED ORDER — ATORVASTATIN CALCIUM 80 MG PO TABS: 80.0000 mg | ORAL_TABLET | Freq: Every day | ORAL | 0 refills | Status: DC

## 2021-08-23 MED ORDER — TICAGRELOR 90 MG PO TABS: 90.0000 mg | ORAL_TABLET | Freq: Two times a day (BID) | ORAL | 2 refills | Status: DC

## 2021-08-23 MED ORDER — ATORVASTATIN CALCIUM 10 MG PO TABS: 10.0000 mg | ORAL_TABLET | Freq: Every day | ORAL | 0 refills | Status: DC

## 2021-08-23 MED ORDER — LOSARTAN POTASSIUM 50 MG PO TABS: 50.0000 mg | ORAL_TABLET | Freq: Every day | ORAL | 0 refills | Status: DC

## 2021-08-23 MED ORDER — ALBUTEROL SULFATE HFA 108 (90 BASE) MCG/ACT IN AERS
1.0000 | INHALATION_SPRAY | Freq: Four times a day (QID) | RESPIRATORY_TRACT | 0 refills | Status: DC | PRN
Start: 2021-08-23 — End: 2021-08-23

## 2021-08-23 MED ORDER — ASPIRIN 81 MG PO CHEW
324.0000 mg | CHEWABLE_TABLET | Freq: Once | ORAL | Status: AC
  Administered 2021-08-23: 324 mg via ORAL
  Filled 2021-08-23: qty 4

## 2021-08-23 MED ORDER — ALBUTEROL SULFATE HFA 108 (90 BASE) MCG/ACT IN AERS: 1.0000 | INHALATION_SPRAY | Freq: Four times a day (QID) | RESPIRATORY_TRACT | 0 refills | Status: DC | PRN

## 2021-08-23 MED ORDER — CARVEDILOL 6.25 MG PO TABS: 6.2500 mg | ORAL_TABLET | Freq: Two times a day (BID) | ORAL | 1 refills | Status: DC

## 2021-08-23 MED ORDER — SODIUM CHLORIDE 0.9 % IV BOLUS
1000.0000 mL | Freq: Once | INTRAVENOUS | Status: AC
  Administered 2021-08-23: 1000 mL via INTRAVENOUS

## 2021-08-23 MED ORDER — ATORVASTATIN CALCIUM 80 MG PO TABS
80.0000 mg | ORAL_TABLET | Freq: Every day | ORAL | 0 refills | Status: DC
Start: 2021-08-23 — End: 2021-08-23

## 2021-08-23 MED ORDER — TICAGRELOR 90 MG PO TABS: 90.0000 mg | ORAL_TABLET | Freq: Two times a day (BID) | ORAL | 0 refills | Status: DC

## 2021-08-23 MED ORDER — ALBUTEROL SULFATE HFA 108 (90 BASE) MCG/ACT IN AERS
1.0000 | INHALATION_SPRAY | Freq: Four times a day (QID) | RESPIRATORY_TRACT | 0 refills | Status: DC | PRN
Start: 2021-08-23 — End: 2022-01-14

## 2021-08-23 MED ORDER — TICAGRELOR 90 MG PO TABS: 90.0000 mg | ORAL_TABLET | Freq: Two times a day (BID) | ORAL | 0 refills | Status: AC

## 2021-08-23 MED ORDER — LOSARTAN POTASSIUM 50 MG PO TABS: 50.0000 mg | ORAL_TABLET | Freq: Every day | ORAL | 1 refills | Status: DC

## 2021-08-23 MED ORDER — IPRATROPIUM-ALBUTEROL 0.5-2.5 (3) MG/3ML IN SOLN
3.0000 mL | Freq: Once | RESPIRATORY_TRACT | Status: AC
  Administered 2021-08-23: 3 mL via RESPIRATORY_TRACT
  Filled 2021-08-23: qty 3

## 2021-08-23 MED ORDER — IOHEXOL 350 MG/ML SOLN
80.0000 mL | Freq: Once | INTRAVENOUS | Status: AC | PRN
  Administered 2021-08-23: 80 mL via INTRAVENOUS

## 2021-08-23 NOTE — ED Provider Notes (Signed)
3:26 PM ?Patient signed out to me by previous ED physician. Pt is a 56 yo male with pmh of cocaine use presenting for chest pain.  ? ?Trops negative. EKG reassuring ?D-dimer pending ? ?Plan: DC if d-dimer negative. Rec discontinuing cocaine use. ? ?Physical Exam  ?BP (!) 151/123   Pulse 62   Temp 98.8 ?F (37.1 ?C)   Resp 15   SpO2 100%  ? ?Physical Exam ?Vitals and nursing note reviewed.  ?Constitutional:   ?   General: He is not in acute distress. ?   Appearance: He is well-developed.  ?HENT:  ?   Head: Normocephalic and atraumatic.  ?Eyes:  ?   Conjunctiva/sclera: Conjunctivae normal.  ?Cardiovascular:  ?   Rate and Rhythm: Normal rate and regular rhythm.  ?   Heart sounds: No murmur heard. ?Pulmonary:  ?   Effort: Pulmonary effort is normal. No respiratory distress.  ?   Breath sounds: Normal breath sounds.  ?Abdominal:  ?   Palpations: Abdomen is soft.  ?   Tenderness: There is no abdominal tenderness.  ?Musculoskeletal:     ?   General: No swelling.  ?   Cervical back: Neck supple.  ?Skin: ?   General: Skin is warm and dry.  ?   Capillary Refill: Capillary refill takes less than 2 seconds.  ?Neurological:  ?   Mental Status: He is alert.  ?Psychiatric:     ?   Mood and Affect: Mood normal.  ? ? ?Procedures  ?Procedures ? ?ED Course / MDM  ?  ?Medical Decision Making ?Amount and/or Complexity of Data Reviewed ?Labs: ordered. ?Radiology: ordered. ? ?Risk ?OTC drugs. ?Prescription drug management. ? ? ?EKG stable.  Troponin x2 stable.  D-dimer elevated.  CT PE ordered with negative results.  Chest pain likely secondary to cocaine use. ? ?I spent at least 3 minutes discussing the dangers of cocaine and encouraged them to quit. ? ?Patient is stable for discharge at this time with close follow-up with cardiology if symptoms continue or stopping cocaine. Patient in no distress and overall condition improved here in the ED. Detailed discussions were had with the patient regarding current findings, and need for  close f/u with PCP or on call doctor. The patient has been instructed to return immediately if the symptoms worsen in any way for re-evaluation. Patient verbalized understanding and is in agreement with current care plan. All questions answered prior to discharge. ? ? ? ? ? ? ?  ?Franne Forts, DO ?08/23/21 1718 ? ?

## 2021-08-23 NOTE — ED Provider Notes (Signed)
?[facility] EMERGENCY DEPARTMENT ?Provider Note ? ? ?CSN: [id number] ?Arrival date & time: 08/23/21  1035 ? ?  ? ?History ? ?Chief Complaint  ?Patient presents with  ? Chest Pain  ? ? ?Lawrence Pope is a 56 y.o. male. ? ? Patient as above with significant medical history as below, including cocaine use, last cocaine use was 2 days ago.  Left heart cath 2022 with DES (RCA stenting, inferior MI),  hypertension who presents to the ED with complaint of chest tightness/pain. ? ?Onset of tightness/pain 2 weeks ago.  Occurs intermittently.  Last for few minutes and resolves.  Midsternal chest tightness with radiation to his left shoulder.  Burning sensation on the left side of the chest left shoulder.  Associated with difficulty taking a deep breath.  He is experienced this in the past, feels similar to prior heart attack.  He has been taking his home medications but does not have PCP follow-up.  Last cocaine use was 2 days ago, nasal.  ? ?Denies fevers, chills, nausea, vomiting, lightheadedness, near syncope, diaphoresis, fevers, chills, recent diet changes, no recent travel or sick contacts.  No change in bowel or bladder function. ? ? ?Past Medical History: ?No date: Hx of migraines ?No date: Hypertension ?No date: Myocardial infarction [facility]) ? ?Past Surgical History: ?01/28/2021: CORONARY/GRAFT ACUTE MI REVASCULARIZATION; N/A ?    Comment:  Procedure: Coronary/Graft Acute MI Revascularization;   ?             Surgeon: Marykay Lex, MD;  Location: [facility] INVASIVE CV  ?             LAB;  Service: Cardiovascular;  Laterality: N/A; ?01/28/2021: LEFT HEART CATH AND CORONARY ANGIOGRAPHY; N/A ?    Comment:  Procedure: LEFT HEART CATH AND CORONARY ANGIOGRAPHY;   ?             Surgeon: Marykay Lex, MD;  Location: [facility] INVASIVE CV  ?             LAB;  Service: Cardiovascular;  Laterality: N/A;  ? ? ?The history is provided by the patient. No language interpreter was used.  ?Chest Pain ?Associated symptoms:  shortness of breath   ?Associated symptoms: no abdominal pain, no cough, no dysphagia, no fever, no headache, no nausea, no palpitations and no vomiting   ? ?  ? ?Home Medications ?Prior to Admission medications   ?Medication Sig Start Date End Date Taking? Authorizing Provider  ?albuterol (VENTOLIN HFA) 108 (90 Base) MCG/ACT inhaler Inhale 1-2 puffs into the lungs every 6 (six) hours as needed for wheezing or shortness of breath. 08/23/21  Yes Tanda Rockers A, DO  ?aspirin 81 MG chewable tablet Chew 1 tablet (81 mg total) by mouth daily. 01/31/21  Yes Arty Baumgartner, NP  ?atorvastatin (LIPITOR) 80 MG tablet Take 1 tablet (80 mg total) by mouth daily. 01/31/21  Yes Arty Baumgartner, NP  ?atorvastatin (LIPITOR) 80 MG tablet Take 1 tablet (80 mg total) by mouth daily. 08/23/21 09/22/21 Yes Sloan Leiter, DO  ?carvedilol (COREG) 6.25 MG tablet Take 1 tablet (6.25 mg total) by mouth 2 (two) times daily with a meal. 01/30/21  Yes Arty Baumgartner, NP  ?carvedilol (COREG) 6.25 MG tablet Take 1 tablet (6.25 mg total) by mouth 2 (two) times daily with a meal. 08/23/21 09/22/21 Yes Sloan Leiter, DO  ?losartan (COZAAR) 50 MG tablet Take 1 tablet (50 mg total) by mouth daily. 01/31/21  Yes Laverda Page  B, NP  ?losartan (COZAAR) 50 MG tablet Take 1 tablet (50 mg total) by mouth daily. 08/23/21 09/22/21 Yes Sloan LeiterGray, Tanyla Stege A, DO  ?nitroGLYCERIN (NITROSTAT) 0.4 MG SL tablet Place 1 tablet (0.4 mg total) under the tongue every 5 (five) minutes as needed. 01/30/21  Yes Arty Baumgartneroberts, Lindsay B, NP  ?ticagrelor (BRILINTA) 90 MG TABS tablet Take 1 tablet (90 mg total) by mouth 2 (two) times daily. 01/30/21  Yes Arty Baumgartneroberts, Lindsay B, NP  ?ticagrelor (BRILINTA) 90 MG TABS tablet Take 1 tablet (90 mg total) by mouth 2 (two) times daily. 08/23/21 09/22/21 Yes Sloan LeiterGray, Brittiny Levitz A, DO  ?   ? ?Allergies    ?Patient has no known allergies.   ? ?Review of Systems   ?Review of Systems  ?Constitutional:  Negative for chills and fever.  ?HENT:  Negative for  facial swelling and trouble swallowing.   ?Eyes:  Negative for photophobia and visual disturbance.  ?Respiratory:  Positive for chest tightness and shortness of breath. Negative for cough.   ?Cardiovascular:  Positive for chest pain. Negative for palpitations.  ?Gastrointestinal:  Negative for abdominal pain, nausea and vomiting.  ?Endocrine: Negative for polydipsia and polyuria.  ?Genitourinary:  Negative for difficulty urinating and hematuria.  ?Musculoskeletal:  Negative for gait problem and joint swelling.  ?Skin:  Negative for pallor and rash.  ?Neurological:  Negative for syncope and headaches.  ?Psychiatric/Behavioral:  Negative for agitation and confusion.   ? ?Physical Exam ?Updated Vital Signs ?BP (!) 175/108   Pulse 61   Temp 98.8 ?F (37.1 ?C)   Resp 15   SpO2 100%  ?Physical Exam ?Vitals and nursing note reviewed.  ?Constitutional:   ?   General: He is not in acute distress. ?   Appearance: He is well-developed. He is not ill-appearing, toxic-appearing or diaphoretic.  ?HENT:  ?   Head: Normocephalic and atraumatic.  ?   Right Ear: External ear normal.  ?   Left Ear: External ear normal.  ?   Mouth/Throat:  ?   Mouth: Mucous membranes are moist.  ?Eyes:  ?   General: No scleral icterus. ?Neck:  ?   Thyroid: No thyromegaly.  ?Cardiovascular:  ?   Rate and Rhythm: Normal rate and regular rhythm.  ?   Pulses: Normal pulses.  ?   Heart sounds: Normal heart sounds.  ?  No S3 or S4 sounds.  ?Pulmonary:  ?   Effort: Pulmonary effort is normal. No respiratory distress.  ?   Breath sounds: No stridor. Wheezing present. No decreased breath sounds.  ?   Comments: Scant wheezing b/l, L > R ?Abdominal:  ?   General: Abdomen is flat.  ?   Palpations: Abdomen is soft.  ?   Tenderness: There is no abdominal tenderness.  ?Musculoskeletal:     ?   General: Normal range of motion.  ?   Cervical back: Normal range of motion.  ?   Right lower leg: No edema.  ?   Left lower leg: No edema.  ?Skin: ?   General: Skin is warm  and dry.  ?   Capillary Refill: Capillary refill takes less than 2 seconds.  ?   Coloration: Skin is not cyanotic.  ?Neurological:  ?   Mental Status: He is alert and oriented to person, place, and time.  ?   GCS: GCS eye subscore is 4. GCS verbal subscore is 5. GCS motor subscore is 6.  ?Psychiatric:     ?   Mood and Affect:  Mood normal.     ?   Behavior: Behavior normal.  ? ? ?ED Results / Procedures / Treatments   ?Labs ?(all labs ordered are listed, but only abnormal results are displayed) ?Labs Reviewed  ?BASIC METABOLIC PANEL - Abnormal; Notable for the following components:  ?    Result Value  ? Chloride 112 (*)   ? Glucose, Bld 103 (*)   ? All other components within normal limits  ?D-DIMER, QUANTITATIVE - Abnormal; Notable for the following components:  ? D-Dimer, Quant 0.78 (*)   ? All other components within normal limits  ?CBC  ?TROPONIN I (HIGH SENSITIVITY)  ?TROPONIN I (HIGH SENSITIVITY)  ? ? ?EKG ?EKG Interpretation ? ?Date/Time:  Saturday August 23 2021 10:43:21 EDT ?Ventricular Rate:  74 ?PR Interval:  174 ?QRS Duration: 92 ?QT Interval:  386 ?QTC Calculation: 428 ?R Axis:   11 ?Text Interpretation: Normal sinus rhythm Minimal voltage criteria for LVH, may be normal variant ( Sokolow-Lyon ) Septal infarct , age undetermined T wave abnormality, consider inferolateral ischemia Abnormal ECG When compared with ECG of 03-Feb-2021 13:08, PREVIOUS ECG IS PRESENT similar to prior tracing, rate has increased Confirmed by Tanda Rockers ([id number]) on 08/23/2021 12:44:21 PM ? ?Radiology ?DG Chest 2 View ? ?Result Date: 08/23/2021 ?CLINICAL DATA:  Chest pain EXAM: CHEST - 2 VIEW COMPARISON:  02/03/2021 FINDINGS: The heart size and mediastinal contours are within normal limits. Coronary artery stent. No pleural effusion or edema. Both lungs are clear. The visualized skeletal structures are unremarkable. IMPRESSION: No active cardiopulmonary disease. Electronically Signed   By: Signa Kell M.D.   On: 08/23/2021 11:25    ? ?Procedures ?Procedures  ? ? ?Medications Ordered in ED ?Medications  ?aspirin chewable tablet 324 mg (324 mg Oral Given 08/23/21 1315)  ?sodium chloride 0.9 % bolus 1,000 mL (0 mLs Intravenous Stopped 4/29/2

## 2021-08-23 NOTE — Discharge Instructions (Addendum)
?MATCH Medication Assistance Card ?Name:  Lawrence Pope ?ID: [id number] ?Bin: [id number] RX Group: [id number] ?Discharge Date:  08/23/2021 ?Expiration Date:  08/30/2021 ?(must be filled within 7 days of discharge) ? ?Dear __________[name]__________ ? ?You have been approved to have the prescriptions written by your discharging physician filled through our [facility] (Medication Assistance Through [facility]) program. This program allows for a one-time (no refills) 34-day supply of selected medications for a low copay amount. ? ?The copay is $3.00 per prescription. For instance, if you have one prescription, you will pay $3.00; for two prescriptions, you pay $6.00; for three prescriptions, you pay $9.00; and so on. ? ?Only certain pharmacies are participating in this program with [facility]. You will need to select one of the pharmacies from the attached list and take your prescriptions, this letter, and your photo ID to one of the participating pharmacies.  ? ?We are excited that you are able to use the [facility] program to get your medications. These prescriptions must be filled within 7 days of hospital discharge or they will no longer be valid for the [facility] program. Should you have any problems with your prescriptions please contact your case management team member at [phone] for [facility]. ? ?Thank you, Gilda Crease DNP, MSN, RN [phone] ? ? ?[place] ? ?  ?[place] - Locations ? ?[facility] ?[facility]        ?[address], [place], [place] ? ?[facility]    ?[address] [place], [place], [place]  ? ?[facility]        [address], [place], [place] ? ?[facility]       [address] [place], [place], [place] ? ?Other ?[facility] ?[address], [place], [place] ? ?[facility]                                                                  ?[address], [place], [place] ? ?[facility]          ?[address], [place], [place] ? ?[facility] ?[address], [place], [place]   ?[address] [place], [place], [place]  ?[address] [place], [place], [place]  ?[address], [place], [place]   ?[address] [place], [place]   ?[address] [place], [place]   ?[address], [place], [place] ?[address], [place], [place] ?[address], [place], [place] ? ? ? ?[facility] ?[address] [place], [place], [place]  ?[address] [place], [place] ?[address], [place], [place]  ?[address], [place], [place] ?[address] [place], [place] ?[address], [place], [place] ?[address], [place], [place] ? ?[address] [place] ?[address], [place], [place] ?[address], [place], [place] ?[address], [place], [place]   ?[address], [place], [place] ?[address], [place], [place] ?[address], [place], [place] ?[address], [place], [place]   ?[address], [place], [place]   ?[address] [place], [place] ?[address], [place], [place]  ?[address], [place], [place] ? ?[facility] ?[address] [place], [place], [place] ?[address] [place], [place] ?[address] [place], [place], [place] ?[address], [place], [place] ?[address] [place], [place], [place] ? ?[address] [place], [place], [place] ?[address] [place], [place] ?[address] [place] [address] [place], [place], [place] ?[address], [place], [place] ?[address] [place] ?[address] [place], [place], [place] ?[address] [place], [place] (effective 10/25/16) ?[address] [place], [place] (effective 10/25/16) ? ? ? ? ?               Intensive Outpatient Programs ? ?[facility]    [facility] ?[address]     [address] ?[place],  [place]     [place], [place] ?[phone]      [phone] ? ?[facility]   [facility]  ?(Inpatient and outpatient)  [phone] (Suboxone and Methadone) ?[address]           [phone]          ? ?[facility]    [facility] - Intensive Outpatient ?[address]     [address] ?[place], [place] [address]     [place], [place]  ?[phone]      [phone] ? ?[facility] (Outpatient, Inpatient, [facility]  [facility] (Groups and Residental) ?(insurance only) [phone]    [place], [place]   ?       [phone] ? ?     ?[facility] (for caregivers and family) ?[address] [facility]     [address] ?Jacky Kindle     [place], [place] ?[phone]      [phone] ? ?Residential Treatment Programs ? ?[facility]  ?[facility](2 years) Residential: 90 days)  [facility] ?[address] [place]      [address] ?[place], [place]     [place], [place] ?[phone]      [phone] or [phone] ? ?[facility]    [facility] ?[address]      [address] ?Jacky Kindle     [place], [place] ?[phone]      [phone] ? ?[facility]   [facility] ([facility]) ?[address]     [address] ?[place], [place] [address]     [place], [place] ?[phone]      [phone] ?Admissions: 8am-3pm M-F ? ?[facility]: Residential Program [phone] Days)              [facility]: [employer]  ?[place], [place]     [place], [place]  ?[phone] or [phone]    (Walk in Hours over the weekend or by referral) ? ? ?[facility]: [facility]:[phone] (for crisis response 24 hours a  ? ?

## 2021-08-23 NOTE — ED Triage Notes (Addendum)
C/o pain to center of chest with SOB, headache, and mild dizziness x 1 week.  States he is out of some of his daily meds x 2 weeks and just started a new job and doesn't have insurance to get meds. ?

## 2021-08-23 NOTE — Care Management (Addendum)
Patient needs to follow up with PCP, in patient instructions.  Will need to call Monday to set up an appointment, as they are 3-4 weeks behind. MATCH done for medications.  Patient states he will make an appointment on Monday.  ? ?MATCH Medication Assistance Card ?Name:  Lawrence Pope ?ID: [id number] ?Bin: [id number] RX Group: [id number] ?Discharge Date:  08/23/2021 ?Expiration Date:  08/30/2021 ?(must be filled within 7 days of discharge) ?

## 2021-08-23 NOTE — Telephone Encounter (Signed)
Pt wanted prescriptions sent to pharmacy.  ?

## 2022-01-13 ENCOUNTER — Other Ambulatory Visit: Payer: Self-pay

## 2022-01-13 ENCOUNTER — Observation Stay (HOSPITAL_COMMUNITY)
Admission: EM | Admit: 2022-01-13 | Discharge: 2022-01-14 | Disposition: A | Discharge status: Home / Self Care | Payer: Self-pay | Attending: Student in an Organized Health Care Education/Training Program | Admitting: Student in an Organized Health Care Education/Training Program

## 2022-01-13 ENCOUNTER — Emergency Department (HOSPITAL_COMMUNITY): Payer: Self-pay

## 2022-01-13 DIAGNOSIS — Z7982 Long term (current) use of aspirin: Secondary | ICD-10-CM | POA: Insufficient documentation

## 2022-01-13 DIAGNOSIS — F17200 Nicotine dependence, unspecified, uncomplicated: Secondary | ICD-10-CM | POA: Insufficient documentation

## 2022-01-13 DIAGNOSIS — Z79899 Other long term (current) drug therapy: Secondary | ICD-10-CM | POA: Insufficient documentation

## 2022-01-13 DIAGNOSIS — I10 Essential (primary) hypertension: Secondary | ICD-10-CM | POA: Insufficient documentation

## 2022-01-13 DIAGNOSIS — I259 Chronic ischemic heart disease, unspecified: Secondary | ICD-10-CM | POA: Diagnosis present

## 2022-01-13 DIAGNOSIS — I2510 Atherosclerotic heart disease of native coronary artery without angina pectoris: Secondary | ICD-10-CM | POA: Insufficient documentation

## 2022-01-13 DIAGNOSIS — E785 Hyperlipidemia, unspecified: Secondary | ICD-10-CM | POA: Diagnosis present

## 2022-01-13 DIAGNOSIS — R0789 Other chest pain: Principal | ICD-10-CM | POA: Insufficient documentation

## 2022-01-13 DIAGNOSIS — Z955 Presence of coronary angioplasty implant and graft: Secondary | ICD-10-CM | POA: Insufficient documentation

## 2022-01-13 DIAGNOSIS — R079 Chest pain, unspecified: Secondary | ICD-10-CM | POA: Diagnosis present

## 2022-01-13 DIAGNOSIS — Z7902 Long term (current) use of antithrombotics/antiplatelets: Secondary | ICD-10-CM | POA: Insufficient documentation

## 2022-01-13 DIAGNOSIS — I249 Acute ischemic heart disease, unspecified: Secondary | ICD-10-CM | POA: Insufficient documentation

## 2022-01-13 LAB — CBC
HCT: 48.2 % (ref 39.0–52.0)
HCT: 47.5 % (ref 39.0–52.0)
Hemoglobin: 16.1 g/dL (ref 13.0–17.0)
Hemoglobin: 15.4 g/dL (ref 13.0–17.0)
MCH: 30.1 pg (ref 26.0–34.0)
MCH: 29.5 pg (ref 26.0–34.0)
MCHC: 33.4 g/dL (ref 30.0–36.0)
MCHC: 32.4 g/dL (ref 30.0–36.0)
MCV: 90.3 fL (ref 80.0–100.0)
MCV: 91 fL (ref 80.0–100.0)
Platelets: 226 10*3/uL (ref 150–400)
Platelets: 221 10*3/uL (ref 150–400)
RBC: 5.34 MIL/uL (ref 4.22–5.81)
RBC: 5.22 MIL/uL (ref 4.22–5.81)
RDW: 14.1 % (ref 11.5–15.5)
RDW: 14.2 % (ref 11.5–15.5)
WBC: 6.8 10*3/uL (ref 4.0–10.5)
WBC: 6.6 10*3/uL (ref 4.0–10.5)
nRBC: 0 % (ref 0.0–0.2)
nRBC: 0 % (ref 0.0–0.2)

## 2022-01-13 LAB — COMPREHENSIVE METABOLIC PANEL
ALT: 13 U/L (ref 0–44)
AST: 17 U/L (ref 15–41)
Albumin: 3.5 g/dL (ref 3.5–5.0)
Alkaline Phosphatase: 63 U/L (ref 38–126)
Anion gap: 9 (ref 5–15)
BUN: 12 mg/dL (ref 6–20)
CO2: 22 mmol/L (ref 22–32)
Calcium: 9.5 mg/dL (ref 8.9–10.3)
Chloride: 108 mmol/L (ref 98–111)
Creatinine, Ser: 1.08 mg/dL (ref 0.61–1.24)
GFR, Estimated: >60 mL/min (ref >60)
Glucose, Bld: 112 mg/dL — ABNORMAL HIGH (ref 70–99)
Potassium: 3.8 mmol/L (ref 3.5–5.1)
Sodium: 139 mmol/L (ref 135–145)
Total Bilirubin: 0.5 mg/dL (ref 0.3–1.2)
Total Protein: 6.6 g/dL (ref 6.5–8.1)

## 2022-01-13 LAB — HIV ANTIBODY (ROUTINE TESTING W REFLEX): HIV Screen 4th Generation wRfx: NONREACTIVE

## 2022-01-13 LAB — TROPONIN I (HIGH SENSITIVITY)
Troponin I (High Sensitivity): 20 ng/L — ABNORMAL HIGH (ref <18)
Troponin I (High Sensitivity): 23 ng/L — ABNORMAL HIGH (ref <18)
Troponin I (High Sensitivity): 21 ng/L — ABNORMAL HIGH (ref <18)

## 2022-01-13 LAB — CREATININE, SERUM
Creatinine, Ser: 1.02 mg/dL (ref 0.61–1.24)
GFR, Estimated: >60 mL/min (ref >60)

## 2022-01-13 MED ORDER — PANTOPRAZOLE SODIUM 40 MG PO TBEC
40.0000 mg | DELAYED_RELEASE_TABLET | Freq: Every day | ORAL | Status: DC
  Administered 2022-01-13 – 2022-01-14 (×2): 40 mg via ORAL
  Filled 2022-01-13 (×2): qty 1

## 2022-01-13 MED ORDER — ENOXAPARIN SODIUM 40 MG/0.4ML IJ SOSY
40.0000 mg | PREFILLED_SYRINGE | INTRAMUSCULAR | Status: DC
  Administered 2022-01-13: 40 mg via SUBCUTANEOUS
  Filled 2022-01-13: qty 0.4

## 2022-01-13 MED ORDER — ASPIRIN 81 MG PO CHEW
324.0000 mg | CHEWABLE_TABLET | Freq: Once | ORAL | Status: AC
  Administered 2022-01-13: 324 mg via ORAL
  Filled 2022-01-13: qty 4

## 2022-01-13 MED ORDER — NITROGLYCERIN 0.4 MG SL SUBL: 0.4000 mg | SUBLINGUAL_TABLET | SUBLINGUAL | Status: DC | PRN

## 2022-01-13 MED ORDER — LOSARTAN POTASSIUM 50 MG PO TABS
50.0000 mg | ORAL_TABLET | Freq: Every day | ORAL | Status: DC
  Administered 2022-01-13 – 2022-01-14 (×2): 50 mg via ORAL
  Filled 2022-01-13 (×2): qty 1

## 2022-01-13 MED ORDER — ASPIRIN 81 MG PO CHEW
81.0000 mg | CHEWABLE_TABLET | Freq: Every day | ORAL | Status: DC
  Administered 2022-01-14: 81 mg via ORAL
  Filled 2022-01-13 (×2): qty 1

## 2022-01-13 MED ORDER — CLOPIDOGREL BISULFATE 75 MG PO TABS
75.0000 mg | ORAL_TABLET | Freq: Every day | ORAL | Status: DC
  Administered 2022-01-13 – 2022-01-14 (×2): 75 mg via ORAL
  Filled 2022-01-13 (×2): qty 1

## 2022-01-13 MED ORDER — ATORVASTATIN CALCIUM 10 MG PO TABS
10.0000 mg | ORAL_TABLET | Freq: Every day | ORAL | Status: DC
  Administered 2022-01-13 – 2022-01-14 (×2): 10 mg via ORAL
  Filled 2022-01-13 (×2): qty 1

## 2022-01-13 NOTE — ED Triage Notes (Signed)
Pt. Stated, Ive had chest pain with nausea and dizziness for 2 weeks.

## 2022-01-13 NOTE — ED Provider Triage Note (Signed)
Emergency Medicine Provider Triage Evaluation Note  Lawrence Pope , a 56 y.o. male  was evaluated in triage.  Pt complains of chest pain. Started an hour ago. Middle of chest. Feels like tight and pressure. Radiates down left arm. Also SOB and dizziness. H/o of heart attack one year ago. One stent placed. Has not taken anything to alleviate his symtpoms.   H/o HTN, high cholesterol   Review of Systems  Positive: See above Negative: See above3  Physical Exam  BP (!) 169/109 (BP Location: Right Arm)   Pulse 76   Temp 98.2 F (36.8 C) (Oral)   Resp 16   Ht 5\' 11"  (1.803 m)   Wt 82.6 kg   SpO2 97%   BMI 25.38 kg/m  Gen:   Awake, in distress  Resp:  Normal effort  MSK:   Moves extremities without difficulty  Other:    Medical Decision Making  Medically screening exam initiated at 12:07 PM.  Appropriate orders placed.  Lawrence Pope was informed that the remainder of the evaluation will be completed by another provider, this initial triage assessment does not replace that evaluation, and the importance of remaining in the ED until their evaluation is complete.  Plan: ekg trop, cbc, cmp, aspirin, nitro, cxr   Harriet Pho, PA-C 01/13/22 1212

## 2022-01-13 NOTE — H&P (Cosign Needed Addendum)
Date: 01/13/2022               Patient Name:  Lawrence Pope MRN: [id number]  DOB: Jul 20, 1965 Age / Sex: 56 y.o., male   PCP: Patient, No Pcp Per         Medical Service: Internal Medicine Teaching Service         Attending Physician: Dr. Oswaldo Done, [place]ita [place], *    First Contact: Dr. Adron Bene Pager: [phone]  Second Contact: Dr. Quincy Simmonds Pager: [phone]       After Hours (After 5p/  First Contact Pager: [phone]  weekends / holidays): Second Contact Pager: [phone]   Chief Complaint: chest pain  History of Present Illness: 56 year old male with CAD and prior STEMI with PCI 01/2021 and polysubstance use (tobacco, cocaine, alcohol) who presented with chronic intermittent chest pain. Reports that pain started approximately 3 months ago. 2 weeks intermittent chest pain began worsening. Usually has 2-3 episodes per day. Today his pain was lasting longer than usual. He describes pain as tightness in the sternal area and burning that radiates down his left arm. Associated anxiety and shortness of breath while having chest pain. Not experiencing the dizziness, diaphoresis, or nausea associated with prior STEMI. Pain worsens with exertion and smoking. Smokes daily. He uses cocaine regularly with most recent use yesterday. Also reports that his pain worsens when he takes his carvedilol. Has not been taking Plavix. Currently asymptomatic.  He has a chronic cough, but not coughing anything up. No orthopnea. No DOE. Not worsened when taking a deep breath.   Meds:  Coreg 6.25 twice daily Losartan Plavix ASA  Ran out of atorvastatin  Current Meds  Medication Sig   aspirin 81 MG chewable tablet Chew 1 tablet (81 mg total) by mouth daily.   carvedilol (COREG) 6.25 MG tablet Take 1 tablet (6.25 mg total) by mouth 2 (two) times daily with a meal.   Allergies: Allergies as of 01/13/2022   (No Known Allergies)   Past Medical History:  Diagnosis Date   Hx of migraines     Hypertension    Myocardial infarction [facility])     Family History: Heart disease(mother), HTN, HLD, MI (Brother), prostate cancer(brother), blood cancer(father), lung cancer (Aunts)  Social History:  - Lives alone, independent.  - Smokes tobacco, recent cocaine a couple times a month most recently yesterday, no THC anymore. 2-3 beers a couple times a week.  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   Physical Exam: Blood pressure (!) 162/99, pulse 61, temperature 98.7 F (37.1 C), temperature source Oral, resp. rate 17, height 5\' 11"  (1.803 m), weight 82.6 kg, SpO2 97 %. General: resting comfortably in bed. NAD Cardiovascular: regular rate and rhythm. Extremities warm and well perfused. No LEE, no JVD Pulmonary: Lungs CTAB, no wheeze, rales, or rhonchi GI: Soft, NT, +BS MSK: No deformity Skin: warm and dry Neuro: No focal deficits Psych: mood and affect appropriate.  EKG: personally reviewed my interpretation is NSR with biphasic twaves in v4  CXR: personally reviewed my interpretation is normal  Assessment & Plan by Problem: Principal Problem:   Chest pain  # Typical chest pain with features of unstable angina # CAD Hx of CAD s/p PCI with DES 10/22. Patient has not been adherent to DAPT 2/2 financial difficulty. Additional risk factors include HTN, HLD. Suspicous for ACS in setting of  medicaiton nonadherence and cocaine use.  EKG showing new biphasic T wave changes in V4. T wave inversions in V5, V6 seen on prior. Case was discussed with cardiology by EDP. Cardiology recommended cycling troponins and admitting to obs for CP r/o.  Trops negative and flat. EKG largely unchanged compared to prior. No evidence of active ischemia/infarction. Given concomitant BB and cocaine use, it is possible that he has been experiencing symptomatic hypertension from unopposed alpha blockade as well.  Patient is high  risk and will need close follow up with cardiology as an outpatient. Will plan to medically optimize pt while admitted.  - Hold coreg due to cocaine  use. - Losartan - restart ASA and Plavix - restart statin - PRN nitro for CP - AM BMP and CBC - ECHO - lipid panel - telemetry - pt may be able to discharge tomorrow  - will need close f/u with cardiology. [facility] for additional medical problems.  # HTN - Restart losartan. - Hold BB due to recent cocaine use  # HLD - Lipid panel -restart statin  # polysubstance use -TOC consult for substance cessation.    Dispo: Admit patient to Observation with expected length of stay less than 2 midnights.  Signed: Delene Ruffini, MD

## 2022-01-13 NOTE — ED Provider Notes (Signed)
[facility] EMERGENCY DEPARTMENT Provider Note   CSN: [id number] Arrival date & time: 01/13/22  1135     History Past Medical History:  Diagnosis Date   Hx of migraines    Hypertension    Myocardial infarction [facility])     Chief Complaint  Patient presents with   Chest Pain   Dizziness   Nausea    Lawrence Pope is a 56 y.o. male.  Patient has a history of STEMI status post PCI with drug-eluting stent to proximal RCA (01/2021), poor medication adherence, tobacco use disorder, and cocaine use disorder.   He presents for intermittent chest pain. He states that his presenting symptoms for his MI in 2022 were diaphoresis, dizziness, and N/V. He states that he developed chest pressure once he was discharged to rehab. He states that the chest pressure could come at rest or with activity.   He sometimes noticed the chest pressure more with lying flat or when his anxiety was worse.   Of note, patient does state that he continues to use cocaine regularly and notes he last used one day ago. His medication bottles also appear full and not filled for the past several months.    Chest Pain Associated symptoms: dizziness   Dizziness Associated symptoms: chest pain        Home Medications Prior to Admission medications   Medication Sig Start Date End Date Taking? Authorizing Provider  albuterol (VENTOLIN HFA) 108 (90 Base) MCG/ACT inhaler Inhale 1-2 puffs into the lungs every 6 (six) hours as needed for wheezing or shortness of breath. 08/23/21   Edwin Dada P, DO  albuterol (VENTOLIN HFA) 108 (90 Base) MCG/ACT inhaler Inhale 1-2 puffs into the lungs every 6 (six) hours as needed for wheezing or shortness of breath. 08/23/21   Franne Forts, DO  aspirin 81 MG chewable tablet Chew 1 tablet (81 mg total) by mouth daily. 01/31/21   Arty Baumgartner, NP  atorvastatin (LIPITOR) 10 MG tablet Take 1 tablet (10 mg total) by mouth daily. 08/23/21   Edwin Dada P, DO   atorvastatin (LIPITOR) 80 MG tablet Take 1 tablet (80 mg total) by mouth daily. 01/31/21   Arty Baumgartner, NP  atorvastatin (LIPITOR) 80 MG tablet Take 1 tablet (80 mg total) by mouth daily. 08/23/21 09/22/21  Edwin Dada P, DO  carvedilol (COREG) 6.25 MG tablet Take 1 tablet (6.25 mg total) by mouth 2 (two) times daily with a meal. 08/23/21 09/22/21  Edwin Dada P, DO  carvedilol (COREG) 6.25 MG tablet Take 1 tablet (6.25 mg total) by mouth 2 (two) times daily with a meal. 08/23/21   Edwin Dada P, DO  losartan (COZAAR) 50 MG tablet Take 1 tablet (50 mg total) by mouth daily. 08/23/21 09/22/21  Edwin Dada P, DO  losartan (COZAAR) 50 MG tablet Take 1 tablet (50 mg total) by mouth daily. 08/23/21   Edwin Dada P, DO  nitroGLYCERIN (NITROSTAT) 0.4 MG SL tablet Place 1 tablet (0.4 mg total) under the tongue every 5 (five) minutes as needed. 01/30/21   Arty Baumgartner, NP  ticagrelor (BRILINTA) 90 MG TABS tablet Take 1 tablet (90 mg total) by mouth 2 (two) times daily. 08/23/21   Franne Forts, DO      Allergies    Patient has no known allergies.    Review of Systems   Review of Systems  Cardiovascular:  Positive for chest pain.  Neurological:  Positive for dizziness.  All other systems reviewed  and are negative.   Physical Exam Updated Vital Signs BP (!) 162/98   Pulse 64   Temp 98.2 F (36.8 C) (Oral)   Resp 20   Ht 5\' 11"  (1.803 m)   Wt 82.6 kg   SpO2 92%   BMI 25.38 kg/m  Physical Exam Constitutional:      General: He is not in acute distress.    Appearance: He is well-developed. He is not ill-appearing.  Cardiovascular:     Rate and Rhythm: Normal rate and regular rhythm.     Heart sounds: Normal heart sounds.  Pulmonary:     Effort: Pulmonary effort is normal.     Breath sounds: Normal breath sounds. No wheezing or rales.  Chest:     Chest wall: No tenderness.  Abdominal:     Palpations: Abdomen is soft.     Tenderness: There is no abdominal tenderness.   Musculoskeletal:     Right lower leg: No edema.     Left lower leg: No edema.  Skin:    General: Skin is warm and dry.  Neurological:     Mental Status: He is alert.  Psychiatric:        Mood and Affect: Mood normal.     ED Results / Procedures / Treatments   Labs (all labs ordered are listed, but only abnormal results are displayed) Labs Reviewed  COMPREHENSIVE METABOLIC PANEL - Abnormal; Notable for the following components:      Result Value   Glucose, Bld 112 (*)    All other components within normal limits  TROPONIN I (HIGH SENSITIVITY) - Abnormal; Notable for the following components:   Troponin I (High Sensitivity) 20 (*)    All other components within normal limits  CBC  TROPONIN I (HIGH SENSITIVITY)    EKG EKG Interpretation  Date/Time:  Tuesday January 13 2022 11:49:25 EDT Ventricular Rate:  76 PR Interval:  176 QRS Duration: 96 QT Interval:  386 QTC Calculation: 434 R Axis:   21 Text Interpretation: Normal sinus rhythm Left ventricular hypertrophy with repolarization abnormality ( Sokolow-Lyon ) Anteroseptal infarct , age undetermined Abnormal ECG No significant change since last tracing Confirmed by [id number] [phone]) on 01/13/2022 3:16:31 PM  Radiology DG Chest 1 View  Result Date: 01/13/2022 CLINICAL DATA:  Provided history: Chest pain, shortness of breath, lightheadedness, nausea. History of hypertension and MI, smoker. EXAM: CHEST  1 VIEW COMPARISON:  CT angiogram chest 08/23/2021. Prior chest radiographs 08/23/2021 and earlier. FINDINGS: Heart size within normal limits. No appreciable airspace consolidation. No evidence of pleural effusion or pneumothorax. Degenerative changes of the spine. IMPRESSION: No evidence of active cardiopulmonary disease. Electronically Signed   By: 08/25/2021 D.O.   On: 01/13/2022 12:48      Medications Ordered in ED Medications  nitroGLYCERIN (NITROSTAT) SL tablet 0.4 mg (has no administration in time range)  aspirin  chewable tablet 324 mg (324 mg Oral Given 01/13/22 1216)    ED Course/ Medical Decision Making/ A&P                           Medical Decision Making Patient has a history of CAD s/p PCI with DES to proximal RCA 01/2021 with poor adherence to his medications, HTN, as well as tobacco use disorder and cocaine use disorder who presents for chest pressure that has been present with rest and with activity for 1-2 months but noted to last longer this AM.   Unclear  exact etiology but possible that patient has unstable angina given his persistent symptoms and minimally elevated troponins. He has no associated dyspnea or cough and his O2 sats are >95% on RA. In addition, his CXR is negative for any acute cardiopulmonary process. Also low suspicion for PE given patient has no risk factors for VTE disease and he has no dyspnea or hypoxia.   Spoke with cardiology who recommended medicine admission for further work up but did not feel his current symptoms warranted emergent/urgent intervention.   Will consult unassigned admission.     Final Clinical Impression(s) / ED Diagnoses Final diagnoses:  None    Rx / DC Orders ED Discharge Orders     None         Rick Duff, MD 01/13/22 [place], [place], DO 01/14/22 [phone]

## 2022-01-14 ENCOUNTER — Observation Stay (HOSPITAL_BASED_OUTPATIENT_CLINIC_OR_DEPARTMENT_OTHER): Payer: Self-pay

## 2022-01-14 ENCOUNTER — Other Ambulatory Visit (HOSPITAL_COMMUNITY): Payer: Self-pay

## 2022-01-14 DIAGNOSIS — F17200 Nicotine dependence, unspecified, uncomplicated: Secondary | ICD-10-CM | POA: Diagnosis present

## 2022-01-14 DIAGNOSIS — I259 Chronic ischemic heart disease, unspecified: Secondary | ICD-10-CM

## 2022-01-14 DIAGNOSIS — F17210 Nicotine dependence, cigarettes, uncomplicated: Secondary | ICD-10-CM

## 2022-01-14 DIAGNOSIS — R0789 Other chest pain: Principal | ICD-10-CM

## 2022-01-14 DIAGNOSIS — R079 Chest pain, unspecified: Secondary | ICD-10-CM

## 2022-01-14 LAB — ECHOCARDIOGRAM COMPLETE
AV Vena cont: 0.3 cm
Area-P 1/2: 2.83 cm2
Calc EF: 57.3 %
Height: 71 in
P 1/2 time: 640 msec
S' Lateral: 2.8 cm
Single Plane A2C EF: 57 %
Single Plane A4C EF: 55.7 %
Weight: 2912 oz

## 2022-01-14 LAB — BASIC METABOLIC PANEL
Anion gap: 8 (ref 5–15)
BUN: 8 mg/dL (ref 6–20)
CO2: 24 mmol/L (ref 22–32)
Calcium: 9.5 mg/dL (ref 8.9–10.3)
Chloride: 111 mmol/L (ref 98–111)
Creatinine, Ser: 1.07 mg/dL (ref 0.61–1.24)
GFR, Estimated: >60 mL/min (ref >60)
Glucose, Bld: 97 mg/dL (ref 70–99)
Potassium: 3.7 mmol/L (ref 3.5–5.1)
Sodium: 143 mmol/L (ref 135–145)

## 2022-01-14 LAB — CBC
HCT: 48.6 % (ref 39.0–52.0)
Hemoglobin: 15.9 g/dL (ref 13.0–17.0)
MCH: 29.8 pg (ref 26.0–34.0)
MCHC: 32.7 g/dL (ref 30.0–36.0)
MCV: 91 fL (ref 80.0–100.0)
Platelets: 196 10*3/uL (ref 150–400)
RBC: 5.34 MIL/uL (ref 4.22–5.81)
RDW: 14.4 % (ref 11.5–15.5)
WBC: 6.2 10*3/uL (ref 4.0–10.5)
nRBC: 0 % (ref 0.0–0.2)

## 2022-01-14 LAB — LIPID PANEL
Cholesterol: 136 mg/dL (ref 0–200)
HDL: 35 mg/dL — ABNORMAL LOW (ref >40)
LDL Cholesterol: 87 mg/dL (ref 0–99)
Total CHOL/HDL Ratio: 3.9 RATIO
Triglycerides: 70 mg/dL (ref <150)
VLDL: 14 mg/dL (ref 0–40)

## 2022-01-14 MED ORDER — CARVEDILOL 3.125 MG PO TABS
6.2500 mg | ORAL_TABLET | Freq: Two times a day (BID) | ORAL | Status: DC
  Administered 2022-01-14 (×2): 6.25 mg via ORAL
  Filled 2022-01-14 (×2): qty 2

## 2022-01-14 MED ORDER — VARENICLINE TARTRATE 0.5 MG PO TABS
0.5000 mg | ORAL_TABLET | Freq: Two times a day (BID) | ORAL | 0 refills | Status: AC
  Filled 2022-01-14: qty 60, 30d supply, fill #0

## 2022-01-14 NOTE — Discharge Instructions (Addendum)
Dear Mr. Lawrence Pope, Os[place] you for trusting [place] with your care today. We treated you in the hospital for chest pain. We ruled out many of the life threatening causes including heart attack. Your pain seems as though it is likely musculoskeletal in origin. Some over the counter NSAIDs is reasonable doses can help with this pain. Heat and ice packs can also be helpful.  It will be very important that you take your heart and blood pressure medicines. These include the aspirin, statin, Coreg, and losartan. Quitting smoking and stopping cocaine use will also be very beneficial as these drugs cause more damage than the medicines can prevent. We will provide you with a prescription of Chantix to help quit smoking.  - Please follow up with your primary care provider Dr. Jeanett Schlein at the [facility] on 01/28/2022 at 2:45 PM.  Address: [facility], [address], [place], [place] [address] Phone: [phone]

## 2022-01-14 NOTE — Progress Notes (Signed)
Echocardiogram 2D Echocardiogram has been performed.  Lawrence Pope 01/14/2022, 12:51 PM

## 2022-01-14 NOTE — Discharge Summary (Signed)
Name: Lawrence Pope MRN: [id number] DOB: Aug 11, 1965 56 y.o. PCP: Patient, No Pcp Per  Date of Admission: 01/13/2022 11:44 AM Date of Discharge: 01/14/2022 Attending Physician: Axel Filler, *  Discharge Diagnosis: 1. Principal Problem:   Chest pain Active Problems:   Essential hypertension   Chronic ischemic heart disease   Hyperlipidemia   Tobacco use disorder  Discharge Medications: Allergies as of 01/14/2022   No Known Allergies      Medication List     STOP taking these medications    ticagrelor 90 MG Tabs tablet Commonly known as: BRILINTA       TAKE these medications    albuterol 108 (90 Base) MCG/ACT inhaler Commonly known as: VENTOLIN HFA Inhale 1-2 puffs into the lungs every 6 (six) hours as needed for wheezing or shortness of breath. What changed: Another medication with the same name was removed. Continue taking this medication, and follow the directions you see here.   Aspirin Low Dose 81 MG chewable tablet Generic drug: aspirin Chew 1 tablet (81 mg total) by mouth daily.   atorvastatin 80 MG tablet Commonly known as: LIPITOR Take 1 tablet (80 mg total) by mouth daily. What changed: Another medication with the same name was removed. Continue taking this medication, and follow the directions you see here.   carvedilol 6.25 MG tablet Commonly known as: COREG Take 1 tablet (6.25 mg total) by mouth 2 (two) times daily with a meal.   losartan 50 MG tablet Commonly known as: COZAAR Take 1 tablet (50 mg total) by mouth daily.   nitroGLYCERIN 0.4 MG SL tablet Commonly known as: Nitrostat Place 1 tablet (0.4 mg total) under the tongue every 5 (five) minutes as needed.   varenicline 0.5 MG tablet Commonly known as: Chantix Take 1 tablet (0.5 mg total) by mouth 2 (two) times daily.        Disposition and follow-up:   Lawrence Pope was discharged from [facility] in Stable condition.  At the hospital follow up  visit please address:  1.   Atypical chest pain CAD and STEMI s/p PCI 2022 ACS ruled out.  Atypical chest pain in the setting of CAD and cocaine use Echocardiogram results: EF 55-60% and no focal motion abnormalities. Asymmetric hypertrophy noted from prior.  -His GDMT including carvedilol, losartan, aspirin and Plavix were resumed -Follow-up with cardiology. Likely needs cardiac MRI  Tobacco use disorder -Chantix was started on this admission  2.  Labs / imaging needed at time of follow-up: NA  3.  Pending labs/ test needing follow-up: NA  Follow-up Appointments:  Follow-up Information     Leonie Man, MD .   Specialty: Cardiology Contact information: [address] [place] [address] [phone]                -F/u with [facility] on 01/28/2022, 2:45 PM with Dr. [facility] Course by problem list: Lawrence Pope is a 56 year old male living with hypertension, hyperlipidemia, CAD and STEMI status post PCI 2022, tobacco and cocaine use disorder, who was admitted for atypical chest pain in the setting of CAD and cocaine use.  Atypical chest pain Ischemic heart disease Patient presents to the emergency room for chest pain that started about 3 months ago.  His chest pain progressively worsened in the last 2 weeks.  The reason that brought him to the emergency room is because of chest pain that last longer than usual.  Described pain as tightness in the substernal  area that radiates to the left arm.  Associated with shortness of breath and anxiety.  Pain is brought on by exertion and also at rest.  Patient is using cocaine regularly and most recent use was the day prior to admission.  He has not been taking his medications consistently.  ACS was ruled out with flat minimally elevated troponin and reassuring EKG.  This atypical chest pain is secondary to CAD and cocaine use.  Repeat echocardiogram showed:  His pain has resolved the next day. His GDMT  including Coreg, losartan, aspirin and atorvastatin were resumed.  Chance to restart it for her tobacco use disorder.  Patient was counseled on cocaine use cessation.  Patient is instructed to follow-up with [facility] and his cardiology.  Discharge Exam:   BP (!) 135/90   Pulse (!) 59   Temp 98.3 F (36.8 C) (Oral)   Resp 16   Ht 5\' 11"  (1.803 m)   Wt 82.6 kg   SpO2 96%   BMI 25.38 kg/m  Discharge exam: General: resting comfortably in bed. NAD Cardiovascular: regular rate and rhythm. Extremities warm and well perfused. No LEE, no JVD Pulmonary: Lungs CTAB, no wheeze, rales, or rhonch Skin: warm and dry Neuro: No focal deficits Psych: mood and affect appropriate.  Pertinent Labs, Studies, and Procedures:     Latest Ref Rng & Units 01/14/2022    3:56 AM 01/13/2022    6:31 PM 01/13/2022   12:23 PM  CBC  WBC 4.0 - 10.5 K/uL 6.2  6.6  6.8   Hemoglobin 13.0 - 17.0 g/dL 01/15/2022  62.5  63.8   Hematocrit 39.0 - 52.0 % 48.6  47.5  48.2   Platelets 150 - 400 K/uL 196  221  226       Latest Ref Rng & Units 01/14/2022    3:56 AM 01/13/2022    6:31 PM 01/13/2022   12:23 PM  CMP  Glucose 70 - 99 mg/dL 97   01/15/2022   BUN 6 - 20 mg/dL 8   12   Creatinine 342 - 1.24 mg/dL 8.76  8.11  5.72   Sodium 135 - 145 mmol/L 143   139   Potassium 3.5 - 5.1 mmol/L 3.7   3.8   Chloride 98 - 111 mmol/L 111   108   CO2 22 - 32 mmol/L 24   22   Calcium 8.9 - 10.3 mg/dL 9.5   9.5   Total Protein 6.5 - 8.1 g/dL   6.6   Total Bilirubin 0.3 - 1.2 mg/dL   0.5   Alkaline Phos 38 - 126 U/L   63   AST 15 - 41 U/L   17   ALT 0 - 44 U/L   13    DG Chest 1 View  Result Date: 01/13/2022 CLINICAL DATA:  Provided history: Chest pain, shortness of breath, lightheadedness, nausea. History of hypertension and MI, smoker. EXAM: CHEST  1 VIEW COMPARISON:  CT angiogram chest 08/23/2021. Prior chest radiographs 08/23/2021 and earlier. FINDINGS: Heart size within normal limits. No appreciable airspace consolidation. No evidence of  pleural effusion or pneumothorax. Degenerative changes of the spine. IMPRESSION: No evidence of active cardiopulmonary disease. Electronically Signed   By: 08/25/2021 D.O.   On: 01/13/2022 12:48     Discharge Instructions: Discharge Instructions     Call MD for:  difficulty breathing, headache or visual disturbances   Complete by: As directed    Call MD for:  extreme fatigue   Complete  by: As directed    Call MD for:  hives   Complete by: As directed    Call MD for:  persistant dizziness or light-headedness   Complete by: As directed    Call MD for:  persistant nausea and vomiting   Complete by: As directed    Call MD for:  redness, tenderness, or signs of infection (pain, swelling, redness, odor or green/yellow discharge around incision site)   Complete by: As directed    Call MD for:  severe uncontrolled pain   Complete by: As directed    Call MD for:  temperature >100.4   Complete by: As directed    Diet - low sodium heart healthy   Complete by: As directed    Increase activity slowly   Complete by: As directed        Signed: Adron Bene, MD 01/14/2022, 4:30 PM

## 2022-01-28 ENCOUNTER — Encounter: Payer: Self-pay | Admitting: Family Medicine

## 2022-01-28 ENCOUNTER — Ambulatory Visit (INDEPENDENT_AMBULATORY_CARE_PROVIDER_SITE_OTHER): Payer: Self-pay | Admitting: Family Medicine

## 2022-01-28 VITALS — BP 151/88 | HR 75 | Temp 98.4°F | Ht 71.0 in | Wt 180.7 lb

## 2022-01-28 DIAGNOSIS — I259 Chronic ischemic heart disease, unspecified: Secondary | ICD-10-CM

## 2022-01-28 DIAGNOSIS — F17200 Nicotine dependence, unspecified, uncomplicated: Secondary | ICD-10-CM

## 2022-01-28 DIAGNOSIS — F17210 Nicotine dependence, cigarettes, uncomplicated: Secondary | ICD-10-CM

## 2022-01-28 DIAGNOSIS — E785 Hyperlipidemia, unspecified: Secondary | ICD-10-CM

## 2022-01-28 DIAGNOSIS — I10 Essential (primary) hypertension: Secondary | ICD-10-CM

## 2022-01-28 MED ORDER — NITROGLYCERIN 0.4 MG SL SUBL: 0.4000 mg | SUBLINGUAL_TABLET | SUBLINGUAL | 2 refills | Status: DC | PRN

## 2022-01-28 MED ORDER — LOSARTAN POTASSIUM 50 MG PO TABS: 50.0000 mg | ORAL_TABLET | Freq: Every day | ORAL | 2 refills | Status: DC

## 2022-01-28 MED ORDER — ISOSORBIDE MONONITRATE ER 30 MG PO TB24: 30.0000 mg | ORAL_TABLET | Freq: Every day | ORAL | 2 refills | Status: DC

## 2022-01-28 MED ORDER — ATORVASTATIN CALCIUM 80 MG PO TABS: 80.0000 mg | ORAL_TABLET | Freq: Every day | ORAL | 2 refills | Status: DC

## 2022-01-28 MED ORDER — CARVEDILOL 12.5 MG PO TABS: 12.5000 mg | ORAL_TABLET | Freq: Two times a day (BID) | ORAL | 2 refills | Status: DC

## 2022-01-28 NOTE — Assessment & Plan Note (Signed)
Blood pressure elevated on arrival.  Initial blood pressure reading 161/90, repeat blood pressure 151/88.  Patient remains asymptomatic.  Patient states he takes his blood pressure medications regularly as prescribed.  He does not monitor blood pressure at home.  Patient denies physical complaints.  Patient's blood pressure is not at goal.  Plan: 1) Stop Carvedilol 6.25 mg      Start Carvedilol 12.5 mg, 1 tablet twice daily New prescription of carvedilol 12.5 mg sent to the pharmacy on file  Patient was educated to monitor and keep the blood pressure log.  Patient will bring the blood pressure log on the follow-up visit in 2 weeks.

## 2022-01-28 NOTE — Patient Instructions (Addendum)
We have made adjustments in the dosage of the medications that will help you with chest pain and provide a better control of your blood pressure.  I will recommend keep the blood pressure log for 2 weeks and bring the log with you on your follow-up visit to help [place] determine the effectiveness of the changes in the dosage of the medications. I will also make a referral to follow-up with cardiologist The Chantix was prescribed to you upon discharge from the hospital.  If you are not able to find the medication you will call us back.  You can also call 1 800 quit now for further help with smoking cessation.   All the prescriptions were sent to the pharmacy on file.  Please call at [phone] to schedule appointment with Dr Ellyn Hack (Cardiologist) at their office:  [address] [place] [address] [phone]

## 2022-01-28 NOTE — Assessment & Plan Note (Signed)
Patient expressed interest to quit smoking and asked for medication for the same.  After reviewing the records from hospital discharge on September 20, patient was given prescription of Chantix.  Patient stated he will look his medication bag when he gets home.  Patient will call the clinic if he is unable to find the medication.  Patient was also provided the information about quit line http://lee-wood.org/ and the 1-800 quit now to call for further assistance and help with quitting smoking.

## 2022-01-28 NOTE — Progress Notes (Signed)
CC: Follow-up for ED/hospital discharge  HPI: Lawrence Pope is a 56 y.o. male with past medical history listed below presenting to [facility] for follow-up visit after ED/hospital discharge.   Patient were evaluated in the emergency department on September 19 for chest pain and dizziness.  The ED work-up was negative for acute cardiovascular event however considering patient's medical history of STEMI post PCI with drug-eluting stent to RCA on September 2022 patient was admitted to the hospital overnight for observation.  Patient was discharged from the hospital in stable condition with recommendations to follow-up with PCP.  Patient presenting today to [facility] to establish primary care services.  Patient reports chest pain predominantly with physical exertion.  Pain is pressure-like, nonradiating no associated diaphoresis, shortness of breath, palpitations, dizziness, nausea or vomiting.  Patient reports pain usually comes up after climbing 2 flight of stairs.  It relieves with rest and approximately 10 minutes.  Patient denies use of nitroglycerin to help relieve his symptoms.  Patient also expressed interest in quitting smoking.  Patient states he has cut down his smoking and only smokes 2 to 4 cigarettes/day.  Patient admitted he used to smoke cocaine however he stopped smoking cocaine since his hospital discharge.  Patient's blood pressure is elevated in the clinic with a reading of 161/90, repeat blood pressure 151/88.  Patient asymptomatic, stable. No acute visible distress.   For details of today's visit and the status of his chronic medical issues please refer to the assessment and plan.   Past Medical History:  Diagnosis Date   Hx of migraines    Hypertension    Myocardial infarction [facility])    Review of Systems:  Review of Systems  Constitutional: Negative.   Respiratory:  Negative for shortness of breath.   Cardiovascular:  Positive for chest pain (Pain with physical exertion). Negative  for palpitations.  Neurological:  Negative for dizziness and headaches.  Psychiatric/Behavioral: Negative.       Physical Exam:Physical Exam Vitals and nursing note reviewed.  Constitutional:      Appearance: Normal appearance.  HENT:     Head: Normocephalic and atraumatic.  Eyes:     Conjunctiva/sclera: Conjunctivae normal.  Cardiovascular:     Rate and Rhythm: Normal rate and regular rhythm.     Pulses: Normal pulses.     Heart sounds: Normal heart sounds. No murmur heard.    No gallop.     Comments: Patient reports chest pressure with physical exertion, predominantly when climbing 2 flight of stairs.  Pressure resolves approximately in 10 minutes with rest.  Patient reports patient experience chest pressure at least once a day. Patient denies chest pain or pressure at the time he presented to the clinic. Pulmonary:     Effort: Pulmonary effort is normal. No respiratory distress.     Breath sounds: Normal breath sounds. No wheezing or rales.  Abdominal:     General: There is no distension.     Tenderness: There is no abdominal tenderness. There is no guarding.  Musculoskeletal:     Right lower leg: No edema.     Left lower leg: No edema.  Skin:    General: Skin is warm.  Neurological:     Mental Status: He is alert and oriented to person, place, and time. Mental status is at baseline.  Psychiatric:        Mood and Affect: Mood normal.      Vitals:   01/28/22 1455 01/28/22 1508  BP: (!) 161/91 (!) 151/88  Pulse: 80 75  Temp: 98.4 F (36.9 C)   TempSrc: Oral   SpO2: 96%   Weight: 180 lb 11.2 oz (82 kg)   Height: 5\' 11"  (1.803 m)      Assessment & Plan:   See Encounters Tab for problem based charting.  Patient seen with Dr. Jimmye Norman

## 2022-01-28 NOTE — Assessment & Plan Note (Signed)
Patient on atorvastatin 80 mg once daily.  His LDL on 01/14/2022 was 87. Patient will continue atorvastatin.

## 2022-01-28 NOTE — Assessment & Plan Note (Signed)
Patient reports he experiences chest pressure with physical exertion, predominantly with climbing 2 flight of stairs. Chest pressure lasts about 10 minutes and resolves with rest.  Considering elevated blood pressure and patient's symptoms of chest pressure with exertion patient will benefit from vasodilator.  Plan:  Imdur 30 mg once daily added to treatment regimen.  Patient have not seen cardiologist since he had the procedure, PCI with DES to RCA in 10/22. Pt was provided with phone number and office address for cardiologist Dr. Ellyn Hack.  Patient will call to schedule an appointment to follow-up with cardiologist for further evaluation and management of patient's symptoms.

## 2022-02-08 NOTE — Progress Notes (Signed)
Internal Medicine Clinic Attending  I saw and evaluated the patient.  I personally confirmed the key portions of the history and exam documented by Dr. Multani and I reviewed pertinent patient test results.  The assessment, diagnosis, and plan were formulated together and I agree with the documentation in the resident's note.  

## 2022-03-06 ENCOUNTER — Other Ambulatory Visit: Payer: Self-pay

## 2022-03-06 ENCOUNTER — Encounter (HOSPITAL_COMMUNITY): Payer: Self-pay | Admitting: Emergency Medicine

## 2022-03-06 ENCOUNTER — Emergency Department (HOSPITAL_COMMUNITY): Payer: Self-pay

## 2022-03-06 ENCOUNTER — Inpatient Hospital Stay (HOSPITAL_COMMUNITY)
Admission: EM | Admit: 2022-03-06 | Discharge: 2022-03-10 | DRG: 322 | Disposition: A | Discharge status: Home / Self Care | Payer: Self-pay | Attending: Internal Medicine | Admitting: Internal Medicine

## 2022-03-06 DIAGNOSIS — I252 Old myocardial infarction: Secondary | ICD-10-CM

## 2022-03-06 DIAGNOSIS — Z79899 Other long term (current) drug therapy: Secondary | ICD-10-CM

## 2022-03-06 DIAGNOSIS — F1410 Cocaine abuse, uncomplicated: Secondary | ICD-10-CM | POA: Diagnosis present

## 2022-03-06 DIAGNOSIS — I214 Non-ST elevation (NSTEMI) myocardial infarction: Principal | ICD-10-CM

## 2022-03-06 DIAGNOSIS — Z91128 Patient's intentional underdosing of medication regimen for other reason: Secondary | ICD-10-CM

## 2022-03-06 DIAGNOSIS — F17200 Nicotine dependence, unspecified, uncomplicated: Secondary | ICD-10-CM | POA: Diagnosis present

## 2022-03-06 DIAGNOSIS — F1210 Cannabis abuse, uncomplicated: Secondary | ICD-10-CM | POA: Diagnosis present

## 2022-03-06 DIAGNOSIS — F17210 Nicotine dependence, cigarettes, uncomplicated: Secondary | ICD-10-CM | POA: Diagnosis present

## 2022-03-06 DIAGNOSIS — I25110 Atherosclerotic heart disease of native coronary artery with unstable angina pectoris: Secondary | ICD-10-CM | POA: Diagnosis present

## 2022-03-06 DIAGNOSIS — Y92009 Unspecified place in unspecified non-institutional (private) residence as the place of occurrence of the external cause: Secondary | ICD-10-CM

## 2022-03-06 DIAGNOSIS — I10 Essential (primary) hypertension: Secondary | ICD-10-CM

## 2022-03-06 DIAGNOSIS — Z955 Presence of coronary angioplasty implant and graft: Secondary | ICD-10-CM

## 2022-03-06 DIAGNOSIS — Z91148 Patient's other noncompliance with medication regimen for other reason: Secondary | ICD-10-CM

## 2022-03-06 DIAGNOSIS — Z8249 Family history of ischemic heart disease and other diseases of the circulatory system: Secondary | ICD-10-CM

## 2022-03-06 DIAGNOSIS — I259 Chronic ischemic heart disease, unspecified: Secondary | ICD-10-CM

## 2022-03-06 DIAGNOSIS — E785 Hyperlipidemia, unspecified: Secondary | ICD-10-CM

## 2022-03-06 LAB — TROPONIN I (HIGH SENSITIVITY)
Troponin I (High Sensitivity): 16 ng/L (ref <18)
Troponin I (High Sensitivity): 110 ng/L (ref <18)

## 2022-03-06 LAB — CBC
HCT: 50.7 % (ref 39.0–52.0)
Hemoglobin: 16.6 g/dL (ref 13.0–17.0)
MCH: 30.2 pg (ref 26.0–34.0)
MCHC: 32.7 g/dL (ref 30.0–36.0)
MCV: 92.2 fL (ref 80.0–100.0)
Platelets: 235 10*3/uL (ref 150–400)
RBC: 5.5 MIL/uL (ref 4.22–5.81)
RDW: 14.1 % (ref 11.5–15.5)
WBC: 6.6 10*3/uL (ref 4.0–10.5)
nRBC: 0 % (ref 0.0–0.2)

## 2022-03-06 LAB — BASIC METABOLIC PANEL
Anion gap: 9 (ref 5–15)
BUN: 10 mg/dL (ref 6–20)
CO2: 28 mmol/L (ref 22–32)
Calcium: 9.8 mg/dL (ref 8.9–10.3)
Chloride: 107 mmol/L (ref 98–111)
Creatinine, Ser: 1.33 mg/dL — ABNORMAL HIGH (ref 0.61–1.24)
GFR, Estimated: >60 mL/min (ref >60)
Glucose, Bld: 96 mg/dL (ref 70–99)
Potassium: 3.9 mmol/L (ref 3.5–5.1)
Sodium: 144 mmol/L (ref 135–145)

## 2022-03-06 NOTE — ED Provider Triage Note (Signed)
Emergency Medicine Provider Triage Evaluation Note  Lawrence Pope , a 56 y.o. male  was evaluated in triage.  Pt complains of chest pain starting at 5:30pm this evening. Pain in center in the chest, feels stabbing. Slight SOB. Both arms feel slightly numb. Hx of MI in 01/2021.   Review of Systems  Positive: CP, SOB, numbness Negative: Headache, syncope, leg pain or swelling  Physical Exam  BP (!) 161/108   Pulse 73   Temp 98.3 F (36.8 C) (Oral)   Resp 18   SpO2 99%  Gen:   Awake, no distress   Resp:  Normal effort  MSK:   Moves extremities without difficulty  Other:    Medical Decision Making  Medically screening exam initiated at 7:28 PM.  Appropriate orders placed.  Lawrence Pope was informed that the remainder of the evaluation will be completed by another provider, this initial triage assessment does not replace that evaluation, and the importance of remaining in the ED until their evaluation is complete.  Workup initiated   Lawrence Pope 03/06/22 1929

## 2022-03-06 NOTE — ED Notes (Signed)
PA at triage notified on patient's elevated Troponin.  

## 2022-03-06 NOTE — ED Triage Notes (Signed)
Patient reports central chest pain this morning with mild SOB , no emesis or diaphoresis , history of MI .

## 2022-03-07 ENCOUNTER — Encounter (HOSPITAL_COMMUNITY): Payer: Self-pay | Admitting: Internal Medicine

## 2022-03-07 DIAGNOSIS — I214 Non-ST elevation (NSTEMI) myocardial infarction: Secondary | ICD-10-CM

## 2022-03-07 LAB — CBC
HCT: 47.2 % (ref 39.0–52.0)
Hemoglobin: 15.1 g/dL (ref 13.0–17.0)
MCH: 29.9 pg (ref 26.0–34.0)
MCHC: 32 g/dL (ref 30.0–36.0)
MCV: 93.5 fL (ref 80.0–100.0)
Platelets: 210 10*3/uL (ref 150–400)
RBC: 5.05 MIL/uL (ref 4.22–5.81)
RDW: 14.4 % (ref 11.5–15.5)
WBC: 7.3 10*3/uL (ref 4.0–10.5)
nRBC: 0 % (ref 0.0–0.2)

## 2022-03-07 LAB — RAPID URINE DRUG SCREEN, HOSP PERFORMED
Amphetamines: NOT DETECTED
Barbiturates: NOT DETECTED
Benzodiazepines: NOT DETECTED
Cocaine: POSITIVE — AB
Opiates: NOT DETECTED
Tetrahydrocannabinol: POSITIVE — AB

## 2022-03-07 LAB — HEPARIN LEVEL (UNFRACTIONATED)
Heparin Unfractionated: 0.12 IU/mL — ABNORMAL LOW (ref 0.30–0.70)
Heparin Unfractionated: 0.58 IU/mL (ref 0.30–0.70)

## 2022-03-07 LAB — TROPONIN I (HIGH SENSITIVITY)
Troponin I (High Sensitivity): 630 ng/L (ref <18)
Troponin I (High Sensitivity): 619 ng/L (ref <18)

## 2022-03-07 MED ORDER — ISOSORBIDE MONONITRATE ER 30 MG PO TB24
30.0000 mg | ORAL_TABLET | Freq: Every day | ORAL | Status: DC
  Administered 2022-03-07 – 2022-03-08 (×2): 30 mg via ORAL
  Filled 2022-03-07 (×2): qty 1

## 2022-03-07 MED ORDER — CARVEDILOL 12.5 MG PO TABS
12.5000 mg | ORAL_TABLET | Freq: Two times a day (BID) | ORAL | Status: DC
  Administered 2022-03-07 – 2022-03-10 (×6): 12.5 mg via ORAL
  Filled 2022-03-07 (×7): qty 1

## 2022-03-07 MED ORDER — ASPIRIN 81 MG PO CHEW
81.0000 mg | CHEWABLE_TABLET | Freq: Every day | ORAL | Status: DC
  Administered 2022-03-07 – 2022-03-08 (×2): 81 mg via ORAL
  Filled 2022-03-07 (×2): qty 1

## 2022-03-07 MED ORDER — HEPARIN BOLUS VIA INFUSION
4000.0000 [IU] | Freq: Once | INTRAVENOUS | Status: AC
  Administered 2022-03-07: 4000 [IU] via INTRAVENOUS
  Filled 2022-03-07: qty 4000

## 2022-03-07 MED ORDER — ONDANSETRON HCL 4 MG/2ML IJ SOLN: 4.0000 mg | Freq: Four times a day (QID) | INTRAMUSCULAR | Status: DC | PRN

## 2022-03-07 MED ORDER — LOSARTAN POTASSIUM 50 MG PO TABS
50.0000 mg | ORAL_TABLET | Freq: Every day | ORAL | Status: DC
  Administered 2022-03-07 – 2022-03-10 (×4): 50 mg via ORAL
  Filled 2022-03-07 (×4): qty 1

## 2022-03-07 MED ORDER — NITROGLYCERIN 0.4 MG SL SUBL: 0.4000 mg | SUBLINGUAL_TABLET | SUBLINGUAL | Status: DC | PRN

## 2022-03-07 MED ORDER — ATORVASTATIN CALCIUM 80 MG PO TABS
80.0000 mg | ORAL_TABLET | Freq: Every day | ORAL | Status: DC
  Administered 2022-03-07 – 2022-03-10 (×4): 80 mg via ORAL
  Filled 2022-03-07: qty 1
  Filled 2022-03-07: qty 2
  Filled 2022-03-07 (×2): qty 1

## 2022-03-07 MED ORDER — HEPARIN BOLUS VIA INFUSION
2000.0000 [IU] | Freq: Once | INTRAVENOUS | Status: AC
  Administered 2022-03-07: 2000 [IU] via INTRAVENOUS
  Filled 2022-03-07: qty 2000

## 2022-03-07 MED ORDER — NICOTINE 14 MG/24HR TD PT24
14.0000 mg | MEDICATED_PATCH | Freq: Every day | TRANSDERMAL | Status: DC
  Administered 2022-03-07 – 2022-03-10 (×4): 14 mg via TRANSDERMAL
  Filled 2022-03-07 (×4): qty 1

## 2022-03-07 MED ORDER — HEPARIN (PORCINE) 25000 UT/250ML-% IV SOLN
1300.0000 [IU]/h | INTRAVENOUS | Status: DC
  Administered 2022-03-07: 1300 [IU]/h via INTRAVENOUS
  Administered 2022-03-07: 1100 [IU]/h via INTRAVENOUS
  Administered 2022-03-08: 1300 [IU]/h via INTRAVENOUS
  Filled 2022-03-07 (×3): qty 250

## 2022-03-07 MED ORDER — ZOLPIDEM TARTRATE 5 MG PO TABS
5.0000 mg | ORAL_TABLET | Freq: Every evening | ORAL | Status: DC | PRN
  Administered 2022-03-08 – 2022-03-09 (×3): 5 mg via ORAL
  Filled 2022-03-07 (×3): qty 1

## 2022-03-07 MED ORDER — ACETAMINOPHEN 325 MG PO TABS
650.0000 mg | ORAL_TABLET | ORAL | Status: DC | PRN
  Administered 2022-03-08 – 2022-03-09 (×2): 650 mg via ORAL
  Filled 2022-03-07 (×2): qty 2

## 2022-03-07 NOTE — H&P (Signed)
Cardiology Admission History and Physical   Patient ID: Lawrence Pope MRN: [id number]; DOB: 31-Jan-1966   Admission date: 03/06/2022  PCP:  Reinaldo Raddle, MD   [facility] Providers Cardiologist:  Bryan Lemma, MD        Chief Complaint:  chest pain  Patient Profile:   Lawrence Pope is a 56 y.o. male with past medical history CAD s/p DES to RCA 2022, hypertension, hyperlipidemia, tobacco and cocaine use disorders who is being seen 03/07/2022 for the evaluation of chest pain.  History of Present Illness:   Lawrence Pope presents for intermittent mid chest dull pain without radiation.  The pain is aggravated with exertion and relieved with SL nitroglycerin.  He also reports bilateral arm tingling.  He says his pain appears to be similar to the pain prior to his stent placement last year. Reports Stable weights. Denies fever, chills, dizziness, syncope, cough, heart palpitations, dyspnea, nausea, vomiting, abdominal fullness, dysuria, diarrhea, pedal edema or any bleeding events.  Currently he is chest pain-free, on room air, no acute distress.  Patient has had multiple ED visits for chest pain. His last ED visit in Sept with negative troponins. However, his 2nd troponin on this admission was 110. ED requested cardiology service.  Patient admits snorts cocaine and continues smoking.   Admission, potassium 3.9, creatinine 1.33, WBC 6.6, urine drug screening was positive for cocaine and tetrahydrocannabinol, troponin 16->110  Past Medical History:  Diagnosis Date   Hx of migraines    Hypertension    Myocardial infarction [facility])     Past Surgical History:  Procedure Laterality Date   CORONARY/GRAFT ACUTE MI REVASCULARIZATION N/A 01/28/2021   Procedure: Coronary/Graft Acute MI Revascularization;  Surgeon: Marykay Lex, MD;  Location: [facility] INVASIVE CV LAB;  Service: Cardiovascular;  Laterality: N/A;   LEFT HEART CATH AND CORONARY ANGIOGRAPHY N/A 01/28/2021    Procedure: LEFT HEART CATH AND CORONARY ANGIOGRAPHY;  Surgeon: Marykay Lex, MD;  Location: [facility] INVASIVE CV LAB;  Service: Cardiovascular;  Laterality: N/A;     Medications Prior to Admission: Prior to Admission medications   Medication Sig Start Date End Date Taking? Authorizing Provider  aspirin 81 MG chewable tablet Chew 1 tablet (81 mg total) by mouth daily. Patient taking differently: Chew 81-162 mg by mouth daily. 01/31/21  Yes Arty Baumgartner, NP  atorvastatin (LIPITOR) 80 MG tablet Take 1 tablet (80 mg total) by mouth daily. 01/28/22 04/28/22 Yes Multani, Bhupinder, MD  carvedilol (COREG) 12.5 MG tablet Take 1 tablet (12.5 mg total) by mouth 2 (two) times daily for 180 doses. 01/28/22 04/28/22 Yes Multani, Bhupinder, MD  isosorbide mononitrate (IMDUR) 30 MG 24 hr tablet Take 1 tablet (30 mg total) by mouth daily. 01/28/22 04/28/22 Yes Multani, Bhupinder, MD  losartan (COZAAR) 50 MG tablet Take 1 tablet (50 mg total) by mouth daily. 01/28/22 04/28/22 Yes Multani, Bhupinder, MD  nitroGLYCERIN (NITROSTAT) 0.4 MG SL tablet Place 1 tablet (0.4 mg total) under the tongue every 5 (five) minutes as needed for chest pain. 01/28/22 04/28/22 Yes Multani, Bhupinder, MD  albuterol (VENTOLIN HFA) 108 (90 Base) MCG/ACT inhaler Inhale 1-2 puffs into the lungs every 6 (six) hours as needed for wheezing or shortness of breath. Patient not taking: Reported on 03/07/2022 08/23/21   Franne Forts, DO     Allergies:   No Known Allergies  Social History:   Social History   Socioeconomic History   Marital status: Legally Separated    Spouse name: Not on file  Number of children: Not on file   Years of education: Not on file   Highest education level: Not on file  Occupational History   Not on file  Tobacco Use   Smoking status: Every Day    Packs/day: 1.00    Types: Cigarettes   Smokeless tobacco: Never  Substance and Sexual Activity   Alcohol use: Yes    Alcohol/week: 8.0 standard drinks of alcohol     Types: 8 Cans of beer per week    Comment: occ   Drug use: Not Currently   Sexual activity: Not on file  Other Topics Concern   Not on file  Social History Narrative   Not on file   Social Determinants of Health   Financial Resource Strain: Not on file  Food Insecurity: Not on file  Transportation Needs: Not on file  Physical Activity: Not on file  Stress: Not on file  Social Connections: Not on file  Intimate Partner Violence: Not on file    Family History:   The patient's family history includes Cancer in his father; Heart attack in his mother; Hypertension in his father and mother.    ROS:  Please see the history of present illness.  All other ROS reviewed and negative.     Physical Exam/Data:   Vitals:   03/07/22 0122 03/07/22 0145 03/07/22 0300 03/07/22 0315  BP: (!) 158/100 (!) 155/91 (!) 152/94 (!) 144/91  Pulse: 61 (!) 57 60 (!) 57  Resp: 16 16 17 16   Temp: 98 F (36.7 C)     TempSrc: Oral     SpO2: 98% 97% 97% 98%  Weight:   81.6 kg   Height:   5\' 11"  (1.803 m)    No intake or output data in the 24 hours ending 03/07/22 0449    03/07/2022    3:00 AM 01/28/2022    2:55 PM 01/13/2022   11:59 AM  Last 3 Weights  Weight (lbs) 180 lb 180 lb 11.2 oz 182 lb  Weight (kg) 81.647 kg 81.965 kg 82.555 kg     Body mass index is 25.1 kg/m.  General:  Well nourished, well developed, in no acute distress HEENT: normal Neck: no JVD Vascular: No carotid bruits; Distal pulses 2+ bilaterally   Cardiac:  normal S1, S2; RRR; no murmur  Lungs:  clear to auscultation bilaterally, no wheezing, rhonchi or rales  Abd: soft, nontender, no hepatomegaly  Ext: no edema Musculoskeletal:  No deformities, BUE and BLE strength normal and equal Skin: warm and dry  Neuro:  CNs 2-12 intact, no focal abnormalities noted Psych:  Normal affect   Relevant CV Studies:   Laboratory Data:  High Sensitivity Troponin:   Recent Labs  Lab 03/06/22 1951 03/06/22 2150  TROPONINIHS 16  110*      Chemistry Recent Labs  Lab 03/06/22 1951  NA 144  K 3.9  CL 107  CO2 28  GLUCOSE 96  BUN 10  CREATININE 1.33*  CALCIUM 9.8  GFRNONAA >60  ANIONGAP 9    No results for input(s): "PROT", "ALBUMIN", "AST", "ALT", "ALKPHOS", "BILITOT" in the last 168 hours. Lipids No results for input(s): "CHOL", "TRIG", "HDL", "LABVLDL", "LDLCALC", "CHOLHDL" in the last 168 hours. Hematology Recent Labs  Lab 03/06/22 1951 03/07/22 0340  WBC 6.6 7.3  RBC 5.50 5.05  HGB 16.6 15.1  HCT 50.7 47.2  MCV 92.2 93.5  MCH 30.2 29.9  MCHC 32.7 32.0  RDW 14.1 14.4  PLT 235 210  Thyroid No results for input(s): "TSH", "FREET4" in the last 168 hours. BNPNo results for input(s): "BNP", "PROBNP" in the last 168 hours.  DDimer No results for input(s): "DDIMER" in the last 168 hours.   Radiology/Studies:  DG Chest 2 View  Result Date: 03/06/2022 CLINICAL DATA:  Chest pain and mild shortness of breath. EXAM: CHEST - 2 VIEW COMPARISON:  01/13/2022. FINDINGS: The heart size and mediastinal contours are within normal limits. Both lungs are clear. Mild degenerative changes are present in the thoracic spine. IMPRESSION: No active cardiopulmonary disease. Electronically Signed   By: Thornell Sartorius M.D.   On: 03/06/2022 20:02     Assessment and Plan:   #NSTEMI -presented with typical angina in the setting of cocaine use (snort, denies IV injection), coronary artery spasm? troponin was trending upon, ECG on admission no major change compared to ECG 01/14/22 -continue to trend troponin until peaks -repeat an echo -agree with heparin gtt for at least 48h  -continue ASA, lipitor, coreg, imdur and losartan -if his pain recurs or troponins continue rising or any major change of his echo, consider invasive ischemic workup  #Hypertension -continue losartan  #Cocaine and tobacco use -cessation education provided   Risk Assessment/Risk Scores:    TIMI Risk Score for Unstable Angina or Non-ST  Elevation MI:   The patient's TIMI risk score is 5, which indicates a 26% risk of all cause mortality, new or recurrent myocardial infarction or need for urgent revascularization in the next 14 days.  Severity of Illness: The appropriate patient status for this patient is INPATIENT. Inpatient status is judged to be reasonable and necessary in order to provide the required intensity of service to ensure the patient's safety. The patient's presenting symptoms, physical exam findings, and initial radiographic and laboratory data in the context of their chronic comorbidities is felt to place them at high risk for further clinical deterioration. Furthermore, it is not anticipated that the patient will be medically stable for discharge from the hospital within 2 midnights of admission.   * I certify that at the point of admission it is my clinical judgment that the patient will require inpatient hospital care spanning beyond 2 midnights from the point of admission due to high intensity of service, high risk for further deterioration and high frequency of surveillance required.*   For questions or updates, please contact [facility] Please consult [email or web address] for contact info under     Signed, Filiberto Pinks, MD  03/07/2022 4:49 AM

## 2022-03-07 NOTE — Progress Notes (Signed)
ANTICOAGULATION CONSULT NOTE  Pharmacy Consult for heparin Indication: chest pain/ACS  No Known Allergies  Patient Measurements: Height: 5\' 11"  (180.3 cm) Weight: 81.6 kg (180 lb) IBW/kg (Calculated) : 75.3  Vital Signs: Temp: 98 F (36.7 C) (11/11 0122) Temp Source: Oral (11/11 0122) BP: 136/105 (11/11 1000) Pulse Rate: 62 (11/11 1000)  Labs: Recent Labs    03/06/22 1951 03/06/22 2150 03/07/22 0340 03/07/22 0704 03/07/22 0950  HGB 16.6  --  15.1  --   --   HCT 50.7  --  47.2  --   --   PLT 235  --  210  --   --   HEPARINUNFRC  --   --   --   --  0.12*  CREATININE 1.33*  --   --   --   --   TROPONINIHS 16 110* 630* 619*  --      Estimated Creatinine Clearance: 66.1 mL/min (A) (by C-G formula based on SCr of 1.33 mg/dL (H)).   Medical History: Past Medical History:  Diagnosis Date   Hx of migraines    Hypertension    Myocardial infarction [facility])     Assessment: 56yo male c/o central CP associated w/ mild SOB, initial troponin negative but now rising >> to begin heparin.  Initial heparin level subtherapeutic on 1100 units/hr  Goal of Therapy:  Heparin level 0.3-0.7 units/ml Monitor platelets by anticoagulation protocol: Yes   Plan:  Heparin 2000 units IV x 1, and gtt increase to 1300 units/hr F/u 6 hour heparin level F/u cards plan/LOT  56yo, PharmD Clinical Pharmacist ED Pharmacist Phone # [phone] 03/07/2022 11:43 AM

## 2022-03-07 NOTE — Progress Notes (Signed)
    Overnight fellow note reviewed and agree. Patient noted to have chest pain, NSTEMI in the setting of ongoing polysubstance abuse including cocaine. Tropoinin stabilized in the mid-600's. On IV heparin. Only minimal troponin elevation last month. Echo pending today. Suspect he will need LHC on Monday.  Chrystie Nose, MD, [facility], FACP  Hand  [facility]  Medical Director of the Advanced Lipid Disorders &  Cardiovascular Risk Reduction Clinic Diplomate of the American Board of Clinical Lipidology Attending Cardiologist  Direct Dial: [phone]  Fax: [phone]  Website:  [email or web address]

## 2022-03-07 NOTE — Progress Notes (Signed)
ANTICOAGULATION CONSULT NOTE  Pharmacy Consult for heparin Indication: chest pain/ACS  No Known Allergies  Patient Measurements: Height: 5\' 10"  (177.8 cm) Weight: 78.7 kg (173 lb 8 oz) IBW/kg (Calculated) : 73  Vital Signs: Temp: 97.7 F (36.5 C) (11/11 1358) Temp Source: Oral (11/11 1358) BP: 167/109 (11/11 1358) Pulse Rate: 60 (11/11 1358)  Labs: Recent Labs    03/06/22 1951 03/06/22 2150 03/07/22 0340 03/07/22 0704 03/07/22 0950 03/07/22 1807  HGB 16.6  --  15.1  --   --   --   HCT 50.7  --  47.2  --   --   --   PLT 235  --  210  --   --   --   HEPARINUNFRC  --   --   --   --  0.12* 0.58  CREATININE 1.33*  --   --   --   --   --   TROPONINIHS 16 110* 630* 619*  --   --      Estimated Creatinine Clearance: 64 mL/min (A) (by C-G formula based on SCr of 1.33 mg/dL (H)).   Medical History: Past Medical History:  Diagnosis Date   Hx of migraines    Hypertension    Myocardial infarction [facility])     Assessment: 56yo male c/o central CP associated w/ mild SOB, initial troponin negative but now rising >> to begin heparin.  Heparin level therapeutic tonight. Will check confirm in AM.   Goal of Therapy:  Heparin level 0.3-0.7 units/ml Monitor platelets by anticoagulation protocol: Yes   Plan:  Cont Heparin 1300 units/hr F/u AM hour heparin level F/u cards plan/LOT  56yo, PharmD, BCIDP, AAHIVP, CPP Infectious Disease Pharmacist 03/07/2022 7:10 PM

## 2022-03-07 NOTE — ED Notes (Signed)
ED TO INPATIENT HANDOFF REPORT  ED Nurse Name and Phone #: [phone]   S Name/Age/Gender Lawrence Pope 56 y.o. male Room/Bed: 030C/030C  Code Status   Code Status: Full Code  Home/SNF/Other Home Patient oriented to: self, place, time, and situation Is this baseline? Yes   Triage Complete: Triage complete  Chief Complaint NSTEMI (non-ST elevated myocardial infarction) [facility]) [I21.4]  Triage Note Patient reports central chest pain this morning with mild SOB , no emesis or diaphoresis , history of MI .    Allergies No Known Allergies  Level of Care/Admitting Diagnosis ED Disposition     ED Disposition  Admit   Condition  --   Comment  Hospital Area: [facility] [[id number]]  Level of Care: Telemetry Cardiac [[id number]]  May admit patient to [facility] or [facility] if equivalent level of care is available:: Yes  Covid Evaluation: Asymptomatic - no recent exposure (last 10 days) testing not required  Diagnosis: NSTEMI (non-ST elevated myocardial infarction) [facility]) [[id number]]  Admitting Physician: Patsy Baltimore  Attending Physician: Parke Poisson [phone]  Certification:: I certify this patient will need inpatient services for at least 2 midnights  Estimated Length of Stay: 3          B Medical/Surgery History Past Medical History:  Diagnosis Date   Hx of migraines    Hypertension    Myocardial infarction [facility])    Past Surgical History:  Procedure Laterality Date   CORONARY/GRAFT ACUTE MI REVASCULARIZATION N/A 01/28/2021   Procedure: Coronary/Graft Acute MI Revascularization;  Surgeon: Marykay Lex, MD;  Location: [facility] INVASIVE CV LAB;  Service: Cardiovascular;  Laterality: N/A;   LEFT HEART CATH AND CORONARY ANGIOGRAPHY N/A 01/28/2021   Procedure: LEFT HEART CATH AND CORONARY ANGIOGRAPHY;  Surgeon: Marykay Lex, MD;  Location: [facility] INVASIVE CV LAB;  Service: Cardiovascular;  Laterality: N/A;     A IV Location/Drains/Wounds Patient  Lines/Drains/Airways Status     Active Line/Drains/Airways     Name Placement date Placement time Site Days   Peripheral IV 03/07/22 20 G Anterior;Distal;Right;Upper Arm 03/07/22  0336  Arm  less than 1   Peripheral IV 03/07/22 20 G Anterior;Right Forearm 03/07/22  0336  Forearm  less than 1            Intake/Output Last 24 hours No intake or output data in the 24 hours ending 03/07/22 1233  Labs/Imaging Results for orders placed or performed during the hospital encounter of 03/06/22 (from the past 48 hour(s))  Basic metabolic panel     Status: Abnormal   Collection Time: 03/06/22  7:51 PM  Result Value Ref Range   Sodium 144 135 - 145 mmol/L   Potassium 3.9 3.5 - 5.1 mmol/L   Chloride 107 98 - 111 mmol/L   CO2 28 22 - 32 mmol/L   Glucose, Bld 96 70 - 99 mg/dL    Comment: Glucose reference range applies only to samples taken after fasting for at least 8 hours.   BUN 10 6 - 20 mg/dL   Creatinine, Ser 2.99 (H) 0.61 - 1.24 mg/dL   Calcium 9.8 8.9 - 37.1 mg/dL   GFR, Estimated >69 >67 mL/min    Comment: (NOTE) Calculated using the CKD-EPI Creatinine Equation (2021)    Anion gap 9 5 - 15    Comment: Performed at [facility], [address] [place], [place] [address]  CBC     Status: None   Collection Time: 03/06/22  7:51 PM  Result Value Ref Range   WBC 6.6 4.0 - 10.5 K/uL   RBC 5.50 4.22 - 5.81 MIL/uL   Hemoglobin 16.6 13.0 - 17.0 g/dL   HCT 46.5 68.1 - 27.5 %   MCV 92.2 80.0 - 100.0 fL   MCH 30.2 26.0 - 34.0 pg   MCHC 32.7 30.0 - 36.0 g/dL   RDW 17.0 01.7 - 49.4 %   Platelets 235 150 - 400 K/uL   nRBC 0.0 0.0 - 0.2 %    Comment: Performed at [facility], [address] [place], [place] [address]  Troponin I (High Sensitivity)     Status: None   Collection Time: 03/06/22  7:51 PM  Result Value Ref Range   Troponin I (High Sensitivity) 16 <18 ng/L    Comment: (NOTE) Elevated high sensitivity troponin I (hsTnI) values and significant  changes across  serial measurements may suggest ACS but many other  chronic and acute conditions are known to elevate hsTnI results.  Refer to the "Links" section for chest pain algorithms and additional  guidance. Performed at [facility], [address], [place], [place] [address]   Troponin I (High Sensitivity)     Status: Abnormal   Collection Time: 03/06/22  9:50 PM  Result Value Ref Range   Troponin I (High Sensitivity) 110 (HH) <18 ng/L    Comment: CRITICAL RESULT CALLED TO, READ BACK BY AND VERIFIED WITH BOBBY SANGALANG RN 03/06/22 2301 M KOROLESKI (NOTE) Elevated high sensitivity troponin I (hsTnI) values and significant  changes across serial measurements may suggest ACS but many other  chronic and acute conditions are known to elevate hsTnI results.  Refer to the "Links" section for chest pain algorithms and additional  guidance. Performed at [facility], [address] [place], [place] [address]   Rapid urine drug screen (hospital performed)     Status: Abnormal   Collection Time: 03/06/22 11:10 PM  Result Value Ref Range   Opiates NONE DETECTED NONE DETECTED   Cocaine POSITIVE (A) NONE DETECTED   Benzodiazepines NONE DETECTED NONE DETECTED   Amphetamines NONE DETECTED NONE DETECTED   Tetrahydrocannabinol POSITIVE (A) NONE DETECTED   Barbiturates NONE DETECTED NONE DETECTED    Comment: (NOTE) DRUG SCREEN FOR MEDICAL PURPOSES ONLY.  IF CONFIRMATION IS NEEDED FOR ANY PURPOSE, NOTIFY LAB WITHIN 5 DAYS.  LOWEST DETECTABLE LIMITS FOR URINE DRUG SCREEN Drug Class                     Cutoff (ng/mL) Amphetamine and metabolites    1000 Barbiturate and metabolites    200 Benzodiazepine                 200 Opiates and metabolites        300 Cocaine and metabolites        300 THC                            50 Performed at [facility], [address] [place], [place] [address]   CBC     Status: None   Collection Time: 03/07/22  3:40 AM  Result Value Ref Range    WBC 7.3 4.0 - 10.5 K/uL   RBC 5.05 4.22 - 5.81 MIL/uL   Hemoglobin 15.1 13.0 - 17.0 g/dL   HCT 17.7 93.9 - 03.0 %   MCV 93.5 80.0 - 100.0 fL   MCH 29.9  26.0 - 34.0 pg   MCHC 32.0 30.0 - 36.0 g/dL   RDW 09.814.4 11.911.5 - 14.715.5 %   Platelets 210 150 - 400 K/uL   nRBC 0.0 0.0 - 0.2 %    Comment: Performed at [facility], [address] [place], [place] [address]  Troponin I (High Sensitivity)     Status: Abnormal   Collection Time: 03/07/22  3:40 AM  Result Value Ref Range   Troponin I (High Sensitivity) 630 (HH) <18 ng/L    Comment: CRITICAL VALUE NOTED. VALUE IS CONSISTENT WITH PREVIOUSLY REPORTED/CALLED VALUE (NOTE) Elevated high sensitivity troponin I (hsTnI) values and significant  changes across serial measurements may suggest ACS but many other  chronic and acute conditions are known to elevate hsTnI results.  Refer to the "Links" section for chest pain algorithms and additional  guidance. Performed at [facility], [address] [place], [place] [address]   Troponin I (High Sensitivity)     Status: Abnormal   Collection Time: 03/07/22  7:04 AM  Result Value Ref Range   Troponin I (High Sensitivity) 619 (HH) <18 ng/L    Comment: CRITICAL VALUE NOTED. VALUE IS CONSISTENT WITH PREVIOUSLY REPORTED/CALLED VALUE (NOTE) Elevated high sensitivity troponin I (hsTnI) values and significant  changes across serial measurements may suggest ACS but many other  chronic and acute conditions are known to elevate hsTnI results.  Refer to the "Links" section for chest pain algorithms and additional  guidance. Performed at [facility], [address] [place], [place] [address]   Heparin level (unfractionated)     Status: Abnormal   Collection Time: 03/07/22  9:50 AM  Result Value Ref Range   Heparin Unfractionated 0.12 (L) 0.30 - 0.70 IU/mL    Comment: (NOTE) The clinical reportable range upper limit is being lowered to >1.10 to align with the FDA approved guidance  for the current laboratory assay.  If heparin results are below expected values, and patient dosage has  been confirmed, suggest follow up testing of antithrombin III levels. Performed at [facility], [address] [place], [place] [address]    DG Chest 2 View  Result Date: 03/06/2022 CLINICAL DATA:  Chest pain and mild shortness of breath. EXAM: CHEST - 2 VIEW COMPARISON:  01/13/2022. FINDINGS: The heart size and mediastinal contours are within normal limits. Both lungs are clear. Mild degenerative changes are present in the thoracic spine. IMPRESSION: No active cardiopulmonary disease. Electronically Signed   By: Thornell SartoriusLaura  Taylor M.D.   On: 03/06/2022 20:02    Pending Labs Unresulted Labs (From admission, onward)     Start     Ordered   03/08/22 0500  Heparin level (unfractionated)  Daily at 5am,   R     See Hyperspace for full Linked Orders Report.   03/07/22 0310   03/07/22 1800  Heparin level (unfractionated)  Once-Timed,   TIMED        03/07/22 1144   03/07/22 0500  CBC  Daily at 5am,   R     See Hyperspace for full Linked Orders Report.   03/07/22 0310            Vitals/Pain Today's Vitals   03/07/22 0845 03/07/22 0900 03/07/22 0930 03/07/22 1000  BP: (!) 167/100 (!) 166/100 (!) 169/92 (!) 136/105  Pulse: 63 (!) 59 (!) 58 62  Resp: 18 18 17 18   Temp:      TempSrc:  SpO2: 97% 97% 96% 98%  Weight:      Height:      PainSc:        Isolation Precautions No active isolations  Medications Medications  heparin ADULT infusion 100 units/mL (25000 units/237mL) (1,100 Units/hr Intravenous New Bag/Given 03/07/22 0346)  aspirin chewable tablet 81 mg (has no administration in time range)  atorvastatin (LIPITOR) tablet 80 mg (has no administration in time range)  carvedilol (COREG) tablet 12.5 mg (has no administration in time range)  isosorbide mononitrate (IMDUR) 24 hr tablet 30 mg (has no administration in time range)  losartan (COZAAR) tablet 50 mg (has  no administration in time range)  nitroGLYCERIN (NITROSTAT) SL tablet 0.4 mg (has no administration in time range)  acetaminophen (TYLENOL) tablet 650 mg (has no administration in time range)  ondansetron (ZOFRAN) injection 4 mg (has no administration in time range)  zolpidem (AMBIEN) tablet 5 mg (has no administration in time range)  nicotine (NICODERM CQ - dosed in mg/24 hours) patch 14 mg (has no administration in time range)  heparin bolus via infusion 2,000 Units (has no administration in time range)  heparin bolus via infusion 4,000 Units (4,000 Units Intravenous Bolus from Bag 03/07/22 0346)    Mobility walks Low fall risk   Focused Assessments Cardiac Assessment Handoff:    Lab Results  Component Value Date   CKTOTAL 131 10/04/2016   TROPONINI <0.03 08/02/2018   Lab Results  Component Value Date   DDIMER 0.78 (H) 08/23/2021   Does the Patient currently have chest pain? No    R Recommendations: See Admitting Provider Note  Report given to:   Additional Notes:

## 2022-03-07 NOTE — ED Provider Notes (Signed)
[facility] EMERGENCY DEPARTMENT Provider Note   CSN: [id number] Arrival date & time: 03/06/22  1904     History  Chief Complaint  Patient presents with   Chest Pain    Lawrence Pope is a 56 y.o. male.  56 year old male who presents the ER today secondary to chest pain.  Patient has a history of ACS status post stent to the RCA back in October 2022.  At that time it was also noted that he had a LAD and circumflex in 1 diagonal lesion.  These were not intervened upon as they were all 50% or below.  He states tonight he was smoking a cigarette and he started having severe chest pain that was sharp in nature did not really radiating where.  No other associated symptoms.  Usually takes his medicine and this goes away but tonight it did not so he presented to the ER.  He states that since being here his pain has improved.  This is nothing like when he had his heart attack when he had his heart attack he was diaphoretic, lightheaded with chest pain and near syncopal.  No other symptoms this time besides the chest pain.  UDS was positive for cocaine when asked about this he states he was not using this tonight.  He states he has used it intermittently.   Chest Pain      Home Medications Prior to Admission medications   Medication Sig Start Date End Date Taking? Authorizing Provider  aspirin 81 MG chewable tablet Chew 1 tablet (81 mg total) by mouth daily. Patient taking differently: Chew 81-162 mg by mouth daily. 01/31/21  Yes Arty Baumgartner, NP  atorvastatin (LIPITOR) 80 MG tablet Take 1 tablet (80 mg total) by mouth daily. 01/28/22 04/28/22 Yes Multani, Bhupinder, MD  carvedilol (COREG) 12.5 MG tablet Take 1 tablet (12.5 mg total) by mouth 2 (two) times daily for 180 doses. 01/28/22 04/28/22 Yes Multani, Bhupinder, MD  isosorbide mononitrate (IMDUR) 30 MG 24 hr tablet Take 1 tablet (30 mg total) by mouth daily. 01/28/22 04/28/22 Yes Multani, Bhupinder, MD  losartan (COZAAR)  50 MG tablet Take 1 tablet (50 mg total) by mouth daily. 01/28/22 04/28/22 Yes Multani, Bhupinder, MD  nitroGLYCERIN (NITROSTAT) 0.4 MG SL tablet Place 1 tablet (0.4 mg total) under the tongue every 5 (five) minutes as needed for chest pain. 01/28/22 04/28/22 Yes Multani, Bhupinder, MD  albuterol (VENTOLIN HFA) 108 (90 Base) MCG/ACT inhaler Inhale 1-2 puffs into the lungs every 6 (six) hours as needed for wheezing or shortness of breath. Patient not taking: Reported on 03/07/2022 08/23/21   Edwin Dada P, DO      Allergies    Patient has no known allergies.    Review of Systems   Review of Systems  Cardiovascular:  Positive for chest pain.    Physical Exam Updated Vital Signs BP (!) 155/91   Pulse (!) 57   Temp 98 F (36.7 C) (Oral)   Resp 16   Ht 5\' 11"  (1.803 m)   Wt 81.6 kg   SpO2 97%   BMI 25.10 kg/m  Physical Exam Vitals and nursing note reviewed.  Constitutional:      Appearance: He is well-developed.  HENT:     Head: Normocephalic and atraumatic.  Cardiovascular:     Rate and Rhythm: Normal rate.  Pulmonary:     Effort: Pulmonary effort is normal. No respiratory distress.  Abdominal:     General: There is no distension.  Musculoskeletal:        General: Normal range of motion.     Cervical back: Normal range of motion.  Neurological:     Mental Status: He is alert.     ED Results / Procedures / Treatments   Labs (all labs ordered are listed, but only abnormal results are displayed) Labs Reviewed  BASIC METABOLIC PANEL - Abnormal; Notable for the following components:      Result Value   Creatinine, Ser 1.33 (*)    All other components within normal limits  RAPID URINE DRUG SCREEN, HOSP PERFORMED - Abnormal; Notable for the following components:   Cocaine POSITIVE (*)    Tetrahydrocannabinol POSITIVE (*)    All other components within normal limits  TROPONIN I (HIGH SENSITIVITY) - Abnormal; Notable for the following components:   Troponin I (High  Sensitivity) 110 (*)    All other components within normal limits  CBC  HEPARIN LEVEL (UNFRACTIONATED)  CBC  TROPONIN I (HIGH SENSITIVITY)    EKG None  Radiology DG Chest 2 View  Result Date: 03/06/2022 CLINICAL DATA:  Chest pain and mild shortness of breath. EXAM: CHEST - 2 VIEW COMPARISON:  01/13/2022. FINDINGS: The heart size and mediastinal contours are within normal limits. Both lungs are clear. Mild degenerative changes are present in the thoracic spine. IMPRESSION: No active cardiopulmonary disease. Electronically Signed   By: Thornell Sartorius M.D.   On: 03/06/2022 20:02    Procedures .Critical Care  Performed by: Marily Memos, MD Authorized by: Marily Memos, MD   Critical care provider statement:    Critical care time (minutes):  30   Critical care was necessary to treat or prevent imminent or life-threatening deterioration of the following conditions:  Cardiac failure   Critical care was time spent personally by me on the following activities:  Development of treatment plan with patient or surrogate, discussions with consultants, evaluation of patient's response to treatment, examination of patient, ordering and review of laboratory studies, ordering and review of radiographic studies, ordering and performing treatments and interventions, pulse oximetry, re-evaluation of patient's condition and review of old charts     Medications Ordered in ED Medications  heparin bolus via infusion 4,000 Units (has no administration in time range)  heparin ADULT infusion 100 units/mL (25000 units/256mL) (has no administration in time range)    ED Course/ Medical Decision Making/ A&P                           Medical Decision Making Amount and/or Complexity of Data Reviewed Labs: ordered.  Risk Prescription drug management. Decision regarding hospitalization.   Considered multiple possible etiologies for chest pain to include pulmonary embolus, aortic dissection, ACS,  pneumonia, pneumothorax however his chest x-ray is reviewed by myself did not show evidence of a widened mediastinum or pneumothorax.  No obvious pulmonary infarct or evidence of pneumonia either.  This was confirmed by radiology.  His EKG was nonischemic however his troponins did bump from 16-1 10 and with his history of known lesions this is above his baseline I discussed with cardiology.  Also consider hypertension as the cause however with the significant increase it makes that less likely.  I started him on heparin and cardiology will see for consultation and likely admission. Cardiology to admit.   Final Clinical Impression(s) / ED Diagnoses Final diagnoses:  NSTEMI (non-ST elevated myocardial infarction) (HCC)    Rx / DC Orders ED Discharge Orders  None         Nasrin Lanzo, Barbara Cower, MD 03/07/22 [phone]

## 2022-03-07 NOTE — ED Notes (Signed)
Pt given water and a bag lunch per MD

## 2022-03-07 NOTE — Progress Notes (Signed)
ANTICOAGULATION CONSULT NOTE - Initial Consult  Pharmacy Consult for heparin Indication: chest pain/ACS  No Known Allergies  Patient Measurements: Height: 5\' 11"  (180.3 cm) Weight: 81.6 kg (180 lb) IBW/kg (Calculated) : 75.3  Vital Signs: Temp: 98 F (36.7 C) (11/11 0122) Temp Source: Oral (11/11 0122) BP: 155/91 (11/11 0145) Pulse Rate: 57 (11/11 0145)  Labs: Recent Labs    03/06/22 1951 03/06/22 2150  HGB 16.6  --   HCT 50.7  --   PLT 235  --   CREATININE 1.33*  --   TROPONINIHS 16 110*    Estimated Creatinine Clearance: 66.1 mL/min (A) (by C-G formula based on SCr of 1.33 mg/dL (H)).   Medical History: Past Medical History:  Diagnosis Date   Hx of migraines    Hypertension    Myocardial infarction [facility])     Assessment: 56yo male c/o central CP associated w/ mild SOB, initial troponin negative but now rising >> to begin heparin.  Goal of Therapy:  Heparin level 0.3-0.7 units/ml Monitor platelets by anticoagulation protocol: Yes   Plan:  Heparin 4000 units IV bolus x1 followed by infusion at 1100 units/hr. Monitor heparin levels and CBC.  56yo, PharmD, BCPS  03/07/2022,3:07 AM

## 2022-03-08 ENCOUNTER — Inpatient Hospital Stay (HOSPITAL_COMMUNITY): Payer: Self-pay

## 2022-03-08 DIAGNOSIS — I214 Non-ST elevation (NSTEMI) myocardial infarction: Secondary | ICD-10-CM

## 2022-03-08 LAB — ECHOCARDIOGRAM COMPLETE
Area-P 1/2: 2.42 cm2
Height: 70 in
P 1/2 time: 693 msec
S' Lateral: 3.4 cm
Weight: 2776.03 oz

## 2022-03-08 LAB — CBC
HCT: 45.1 % (ref 39.0–52.0)
Hemoglobin: 15 g/dL (ref 13.0–17.0)
MCH: 30.3 pg (ref 26.0–34.0)
MCHC: 33.3 g/dL (ref 30.0–36.0)
MCV: 91.1 fL (ref 80.0–100.0)
Platelets: 200 10*3/uL (ref 150–400)
RBC: 4.95 MIL/uL (ref 4.22–5.81)
RDW: 14.4 % (ref 11.5–15.5)
WBC: 7.2 10*3/uL (ref 4.0–10.5)
nRBC: 0 % (ref 0.0–0.2)

## 2022-03-08 LAB — HEPARIN LEVEL (UNFRACTIONATED): Heparin Unfractionated: 0.55 IU/mL (ref 0.30–0.70)

## 2022-03-08 NOTE — Progress Notes (Signed)
Rounding Note    Patient Name: Lawrence Pope Date of Encounter: 03/08/2022  [facility] Cardiologist: Glenetta Hew, MD    Subjective   56 year old gentleman with a history of coronary artery disease, status post stenting of the right coronary artery  Patient continues to use cocaine and continues to smoke.  He has had multiple visits to the emergency room.  His most recent visit was associated with an elevated troponin level.    Troponin levels overnight are 110, 630, 619  Feels well, some residual pain   Inpatient Medications    Scheduled Meds:  aspirin  81 mg Oral Daily   atorvastatin  80 mg Oral Daily   carvedilol  12.5 mg Oral BID   isosorbide mononitrate  30 mg Oral Daily   losartan  50 mg Oral Daily   nicotine  14 mg Transdermal Daily   Continuous Infusions:  heparin 1,300 Units/hr (03/07/22 2359)   PRN Meds: acetaminophen, nitroGLYCERIN, ondansetron (ZOFRAN) IV, zolpidem   Vital Signs    Vitals:   03/07/22 2100 03/07/22 2348 03/07/22 2350 03/08/22 0607  BP: (!) 159/95 (!) 173/99 (!) 173/99 (!) 178/89  Pulse: 73 62 62 (!) 58  Resp: 20  18 18   Temp: 98.6 F (37 C)  98.8 F (37.1 C) 97.9 F (36.6 C)  TempSrc: Oral  Oral Oral  SpO2: 96%  95% 96%  Weight:      Height:        Intake/Output Summary (Last 24 hours) at 03/08/2022 0829 Last data filed at 03/07/2022 2359 Gross per 24 hour  Intake 903.23 ml  Output --  Net 903.23 ml      03/07/2022    1:58 PM 03/07/2022    3:00 AM 01/28/2022    2:55 PM  Last 3 Weights  Weight (lbs) 173 lb 8 oz 180 lb 180 lb 11.2 oz  Weight (kg) 78.7 kg 81.647 kg 81.965 kg      Telemetry    NSR  - Personally Reviewed  ECG     - Personally Reviewed  Physical Exam   GEN: No acute distress.   Neck: No JVD Cardiac: RRR, no murmurs, rubs, or gallops.  Respiratory: Clear to auscultation bilaterally. GI: Soft, nontender, non-distended  MS: No edema; No deformity. Neuro:  Nonfocal  Psych: Normal  affect   Labs    High Sensitivity Troponin:   Recent Labs  Lab 03/06/22 1951 03/06/22 2150 03/07/22 0340 03/07/22 0704  TROPONINIHS 16 110* 630* 619*     Chemistry Recent Labs  Lab 03/06/22 1951  NA 144  K 3.9  CL 107  CO2 28  GLUCOSE 96  BUN 10  CREATININE 1.33*  CALCIUM 9.8  GFRNONAA >60  ANIONGAP 9    Lipids No results for input(s): "CHOL", "TRIG", "HDL", "LABVLDL", "LDLCALC", "CHOLHDL" in the last 168 hours.  Hematology Recent Labs  Lab 03/06/22 1951 03/07/22 0340 03/08/22 0115  WBC 6.6 7.3 7.2  RBC 5.50 5.05 4.95  HGB 16.6 15.1 15.0  HCT 50.7 47.2 45.1  MCV 92.2 93.5 91.1  MCH 30.2 29.9 30.3  MCHC 32.7 32.0 33.3  RDW 14.1 14.4 14.4  PLT 235 210 200   Thyroid No results for input(s): "TSH", "FREET4" in the last 168 hours.  BNPNo results for input(s): "BNP", "PROBNP" in the last 168 hours.  DDimer No results for input(s): "DDIMER" in the last 168 hours.   Radiology    DG Chest 2 View  Result Date: 03/06/2022 CLINICAL DATA:  Chest pain and mild shortness of breath. EXAM: CHEST - 2 VIEW COMPARISON:  01/13/2022. FINDINGS: The heart size and mediastinal contours are within normal limits. Both lungs are clear. Mild degenerative changes are present in the thoracic spine. IMPRESSION: No active cardiopulmonary disease. Electronically Signed   By: Thornell Sartorius M.D.   On: 03/06/2022 20:02    Cardiac Studies      Patient Profile     56 y.o. male    Assessment & Plan     Patient has history of coronary artery disease.  He has a history of ongoing cocaine use.  Also has a history of cigarette smoking.  He is no longer having any acute pain.  He does have some residual pain.  We will put him on the cath board for tomorrow.  We have discussed the risk, benefits, options of heart catheterization and stenting.  He understands and agrees to proceed.  Orders appear to already be written .   2.  Hyperlipidemia.  Last lipids were drawn in September, 2023: His  total cholesterol is 136, HDL is 35, LDL is 87, triglyceride level 70. His goal LDL is now 55. Continue high-dose atorvastatin for now.    For questions or updates, please contact [facility] Please consult [email or web address] for contact info under        Signed, Kristeen Miss, MD  03/08/2022, 8:29 AM

## 2022-03-08 NOTE — Progress Notes (Signed)
ANTICOAGULATION CONSULT NOTE  Pharmacy Consult for heparin Indication: chest pain/ACS  No Known Allergies  Patient Measurements: Height: 5\' 10"  (177.8 cm) Weight: 78.7 kg (173 lb 8 oz) IBW/kg (Calculated) : 73  Vital Signs: Temp: 97.9 F (36.6 C) (11/12 0607) Temp Source: Oral (11/12 0607) BP: 178/89 (11/12 0607) Pulse Rate: 58 (11/12 0607)  Labs: Recent Labs    03/06/22 1951 03/06/22 2150 03/07/22 0340 03/07/22 0704 03/07/22 0950 03/07/22 1807 03/08/22 0115  HGB 16.6  --  15.1  --   --   --  15.0  HCT 50.7  --  47.2  --   --   --  45.1  PLT 235  --  210  --   --   --  200  HEPARINUNFRC  --   --   --   --  0.12* 0.58 0.55  CREATININE 1.33*  --   --   --   --   --   --   TROPONINIHS 16 110* 630* 619*  --   --   --      Estimated Creatinine Clearance: 64 mL/min (A) (by C-G formula based on SCr of 1.33 mg/dL (H)).   Medical History: Past Medical History:  Diagnosis Date   Hx of migraines    Hypertension    Myocardial infarction [facility])     Assessment: 56yo male c/o central CP associated w/ mild SOB, initial troponin negative but now rising >> to begin heparin. Cardiology plans for Cath on Monday.   Heparin level therapeutic on drip rate 1300 units/hr. Last heparin level 0.55. Hgb 15 and plt 200. No issues with infusion or s/sx bleeding noted.   Goal of Therapy:  Heparin level 0.3-0.7 units/ml Monitor platelets by anticoagulation protocol: Yes   Plan:  Cont Heparin 1300 units/hr F/u heparin level daily  Monitor CBC, s/sx of bleeding F/u cards plan/LOT  Sunday, PharmD Pharmacy Resident  03/08/2022 9:00 AM

## 2022-03-08 NOTE — H&P (View-Only) (Signed)
 Rounding Note    Patient Name: Lawrence Pope Date of Encounter: 03/08/2022  [facility] Cardiologist: David Harding, MD    Subjective   56-year-old gentleman with a history of coronary artery disease, status post stenting of the right coronary artery  Patient continues to use cocaine and continues to smoke.  He has had multiple visits to the emergency room.  His most recent visit was associated with an elevated troponin level.    Troponin levels overnight are 110, 630, 619  Feels well, some residual pain   Inpatient Medications    Scheduled Meds:  aspirin  81 mg Oral Daily   atorvastatin  80 mg Oral Daily   carvedilol  12.5 mg Oral BID   isosorbide mononitrate  30 mg Oral Daily   losartan  50 mg Oral Daily   nicotine  14 mg Transdermal Daily   Continuous Infusions:  heparin 1,300 Units/hr (03/07/22 2359)   PRN Meds: acetaminophen, nitroGLYCERIN, ondansetron (ZOFRAN) IV, zolpidem   Vital Signs    Vitals:   03/07/22 2100 03/07/22 2348 03/07/22 2350 03/08/22 0607  BP: (!) 159/95 (!) 173/99 (!) 173/99 (!) 178/89  Pulse: 73 62 62 (!) 58  Resp: 20  18 18  Temp: 98.6 F (37 C)  98.8 F (37.1 C) 97.9 F (36.6 C)  TempSrc: Oral  Oral Oral  SpO2: 96%  95% 96%  Weight:      Height:        Intake/Output Summary (Last 24 hours) at 03/08/2022 0829 Last data filed at 03/07/2022 2359 Gross per 24 hour  Intake 903.23 ml  Output --  Net 903.23 ml      03/07/2022    1:58 PM 03/07/2022    3:00 AM 01/28/2022    2:55 PM  Last 3 Weights  Weight (lbs) 173 lb 8 oz 180 lb 180 lb 11.2 oz  Weight (kg) 78.7 kg 81.647 kg 81.965 kg      Telemetry    NSR  - Personally Reviewed  ECG     - Personally Reviewed  Physical Exam   GEN: No acute distress.   Neck: No JVD Cardiac: RRR, no murmurs, rubs, or gallops.  Respiratory: Clear to auscultation bilaterally. GI: Soft, nontender, non-distended  MS: No edema; No deformity. Neuro:  Nonfocal  Psych: Normal  affect   Labs    High Sensitivity Troponin:   Recent Labs  Lab 03/06/22 1951 03/06/22 2150 03/07/22 0340 03/07/22 0704  TROPONINIHS 16 110* 630* 619*     Chemistry Recent Labs  Lab 03/06/22 1951  NA 144  K 3.9  CL 107  CO2 28  GLUCOSE 96  BUN 10  CREATININE 1.33*  CALCIUM 9.8  GFRNONAA >60  ANIONGAP 9    Lipids No results for input(s): "CHOL", "TRIG", "HDL", "LABVLDL", "LDLCALC", "CHOLHDL" in the last 168 hours.  Hematology Recent Labs  Lab 03/06/22 1951 03/07/22 0340 03/08/22 0115  WBC 6.6 7.3 7.2  RBC 5.50 5.05 4.95  HGB 16.6 15.1 15.0  HCT 50.7 47.2 45.1  MCV 92.2 93.5 91.1  MCH 30.2 29.9 30.3  MCHC 32.7 32.0 33.3  RDW 14.1 14.4 14.4  PLT 235 210 200   Thyroid No results for input(s): "TSH", "FREET4" in the last 168 hours.  BNPNo results for input(s): "BNP", "PROBNP" in the last 168 hours.  DDimer No results for input(s): "DDIMER" in the last 168 hours.   Radiology    DG Chest 2 View  Result Date: 03/06/2022 CLINICAL DATA:    Chest pain and mild shortness of breath. EXAM: CHEST - 2 VIEW COMPARISON:  01/13/2022. FINDINGS: The heart size and mediastinal contours are within normal limits. Both lungs are clear. Mild degenerative changes are present in the thoracic spine. IMPRESSION: No active cardiopulmonary disease. Electronically Signed   By: Thornell Sartorius M.D.   On: 03/06/2022 20:02    Cardiac Studies      Patient Profile     56 y.o. male    Assessment & Plan     Patient has history of coronary artery disease.  He has a history of ongoing cocaine use.  Also has a history of cigarette smoking.  He is no longer having any acute pain.  He does have some residual pain.  We will put him on the cath board for tomorrow.  We have discussed the risk, benefits, options of heart catheterization and stenting.  He understands and agrees to proceed.  Orders appear to already be written .   2.  Hyperlipidemia.  Last lipids were drawn in September, 2023: His  total cholesterol is 136, HDL is 35, LDL is 87, triglyceride level 70. His goal LDL is now 55. Continue high-dose atorvastatin for now.    For questions or updates, please contact [facility] Please consult [email or web address] for contact info under        Signed, Kristeen Miss, MD  03/08/2022, 8:29 AM

## 2022-03-08 NOTE — Progress Notes (Signed)
Echocardiogram 2D Echocardiogram has been performed.  Warren Lacy Janifer Gieselman RDCS 03/08/2022, 8:21 AM

## 2022-03-09 ENCOUNTER — Encounter (HOSPITAL_COMMUNITY): Payer: Self-pay | Admitting: Cardiovascular Disease

## 2022-03-09 ENCOUNTER — Encounter (HOSPITAL_COMMUNITY)
Admission: EM | Disposition: A | Discharge status: Home / Self Care | Payer: Self-pay | Source: Home / Self Care | Attending: Internal Medicine

## 2022-03-09 DIAGNOSIS — F1910 Other psychoactive substance abuse, uncomplicated: Secondary | ICD-10-CM

## 2022-03-09 DIAGNOSIS — I10 Essential (primary) hypertension: Secondary | ICD-10-CM

## 2022-03-09 DIAGNOSIS — I2510 Atherosclerotic heart disease of native coronary artery without angina pectoris: Secondary | ICD-10-CM

## 2022-03-09 HISTORY — PX: LEFT HEART CATH AND CORONARY ANGIOGRAPHY: CATH118249

## 2022-03-09 HISTORY — PX: CORONARY STENT INTERVENTION: CATH118234

## 2022-03-09 LAB — CBC
HCT: 47.6 % (ref 39.0–52.0)
Hemoglobin: 15 g/dL (ref 13.0–17.0)
MCH: 29.6 pg (ref 26.0–34.0)
MCHC: 31.5 g/dL (ref 30.0–36.0)
MCV: 93.9 fL (ref 80.0–100.0)
Platelets: 184 10*3/uL (ref 150–400)
RBC: 5.07 MIL/uL (ref 4.22–5.81)
RDW: 14.5 % (ref 11.5–15.5)
WBC: 7.2 10*3/uL (ref 4.0–10.5)
nRBC: 0 % (ref 0.0–0.2)

## 2022-03-09 LAB — BASIC METABOLIC PANEL
Anion gap: 10 (ref 5–15)
BUN: 10 mg/dL (ref 6–20)
CO2: 20 mmol/L — ABNORMAL LOW (ref 22–32)
Calcium: 9 mg/dL (ref 8.9–10.3)
Chloride: 108 mmol/L (ref 98–111)
Creatinine, Ser: 1.06 mg/dL (ref 0.61–1.24)
GFR, Estimated: >60 mL/min (ref >60)
Glucose, Bld: 106 mg/dL — ABNORMAL HIGH (ref 70–99)
Potassium: 4 mmol/L (ref 3.5–5.1)
Sodium: 138 mmol/L (ref 135–145)

## 2022-03-09 LAB — HEPARIN LEVEL (UNFRACTIONATED): Heparin Unfractionated: 0.33 IU/mL (ref 0.30–0.70)

## 2022-03-09 SURGERY — LEFT HEART CATH AND CORONARY ANGIOGRAPHY
Anesthesia: LOCAL

## 2022-03-09 MED ORDER — TICAGRELOR 90 MG PO TABS
90.0000 mg | ORAL_TABLET | Freq: Two times a day (BID) | ORAL | Status: DC
  Administered 2022-03-09: 90 mg via ORAL
  Filled 2022-03-09: qty 1

## 2022-03-09 MED ORDER — FENTANYL CITRATE (PF) 100 MCG/2ML IJ SOLN
INTRAMUSCULAR | Status: AC
  Filled 2022-03-09: qty 2

## 2022-03-09 MED ORDER — VERAPAMIL HCL 2.5 MG/ML IV SOLN
INTRAVENOUS | Status: AC
  Filled 2022-03-09: qty 2

## 2022-03-09 MED ORDER — NITROGLYCERIN 1 MG/10 ML FOR IR/CATH LAB
INTRA_ARTERIAL | Status: DC | PRN
  Administered 2022-03-09: 200 ug via INTRACORONARY

## 2022-03-09 MED ORDER — TICAGRELOR 90 MG PO TABS
ORAL_TABLET | ORAL | Status: AC
  Filled 2022-03-09: qty 1

## 2022-03-09 MED ORDER — HEPARIN (PORCINE) IN NACL 1000-0.9 UT/500ML-% IV SOLN
INTRAVENOUS | Status: DC | PRN
  Administered 2022-03-09 (×2): 500 mL

## 2022-03-09 MED ORDER — IOHEXOL 350 MG/ML SOLN
INTRAVENOUS | Status: DC | PRN
  Administered 2022-03-09: 155 mL

## 2022-03-09 MED ORDER — SODIUM CHLORIDE 0.9% FLUSH: 3.0000 mL | INTRAVENOUS | Status: DC | PRN

## 2022-03-09 MED ORDER — ONDANSETRON HCL 4 MG/2ML IJ SOLN: 4.0000 mg | Freq: Four times a day (QID) | INTRAMUSCULAR | Status: DC | PRN

## 2022-03-09 MED ORDER — HEPARIN SODIUM (PORCINE) 1000 UNIT/ML IJ SOLN
INTRAMUSCULAR | Status: DC | PRN
  Administered 2022-03-09 (×2): 4000 [IU] via INTRAVENOUS
  Administered 2022-03-09: 2000 [IU] via INTRAVENOUS

## 2022-03-09 MED ORDER — LIDOCAINE HCL (PF) 1 % IJ SOLN
INTRAMUSCULAR | Status: AC
  Filled 2022-03-09: qty 30

## 2022-03-09 MED ORDER — MORPHINE SULFATE (PF) 2 MG/ML IV SOLN: 2.0000 mg | INTRAVENOUS | Status: DC | PRN

## 2022-03-09 MED ORDER — SODIUM CHLORIDE 0.9% FLUSH: 3.0000 mL | Freq: Two times a day (BID) | INTRAVENOUS | Status: DC

## 2022-03-09 MED ORDER — ACETAMINOPHEN 325 MG PO TABS: 650.0000 mg | ORAL_TABLET | ORAL | Status: DC | PRN

## 2022-03-09 MED ORDER — MIDAZOLAM HCL 2 MG/2ML IJ SOLN
INTRAMUSCULAR | Status: AC
  Filled 2022-03-09: qty 2

## 2022-03-09 MED ORDER — SODIUM CHLORIDE 0.9 % IV SOLN: 250.0000 mL | INTRAVENOUS | Status: DC | PRN

## 2022-03-09 MED ORDER — HEPARIN SODIUM (PORCINE) 1000 UNIT/ML IJ SOLN
INTRAMUSCULAR | Status: AC
  Filled 2022-03-09: qty 10

## 2022-03-09 MED ORDER — FENTANYL CITRATE (PF) 100 MCG/2ML IJ SOLN
INTRAMUSCULAR | Status: DC | PRN
  Administered 2022-03-09: 25 ug via INTRAVENOUS

## 2022-03-09 MED ORDER — LABETALOL HCL 5 MG/ML IV SOLN: 10.0000 mg | INTRAVENOUS | Status: AC | PRN

## 2022-03-09 MED ORDER — SODIUM CHLORIDE 0.9 % IV SOLN: INTRAVENOUS | Status: AC

## 2022-03-09 MED ORDER — SODIUM CHLORIDE 0.9 % WEIGHT BASED INFUSION: 3.0000 mL/kg/h | INTRAVENOUS | Status: DC

## 2022-03-09 MED ORDER — HEPARIN (PORCINE) IN NACL 1000-0.9 UT/500ML-% IV SOLN
INTRAVENOUS | Status: AC
  Filled 2022-03-09: qty 1000

## 2022-03-09 MED ORDER — TICAGRELOR 90 MG PO TABS
ORAL_TABLET | ORAL | Status: DC | PRN
  Administered 2022-03-09: 180 mg via ORAL

## 2022-03-09 MED ORDER — MIDAZOLAM HCL 2 MG/2ML IJ SOLN
INTRAMUSCULAR | Status: DC | PRN
  Administered 2022-03-09: 1 mg via INTRAVENOUS

## 2022-03-09 MED ORDER — ASPIRIN 81 MG PO CHEW
81.0000 mg | CHEWABLE_TABLET | ORAL | Status: AC
  Administered 2022-03-09: 81 mg via ORAL
  Filled 2022-03-09: qty 1

## 2022-03-09 MED ORDER — LIDOCAINE HCL (PF) 1 % IJ SOLN
INTRAMUSCULAR | Status: DC | PRN
  Administered 2022-03-09: 2 mL

## 2022-03-09 MED ORDER — ASPIRIN 81 MG PO CHEW
81.0000 mg | CHEWABLE_TABLET | Freq: Every day | ORAL | Status: DC
  Administered 2022-03-10: 81 mg via ORAL
  Filled 2022-03-09: qty 1

## 2022-03-09 MED ORDER — SODIUM CHLORIDE 0.9 % WEIGHT BASED INFUSION
1.0000 mL/kg/h | INTRAVENOUS | Status: DC
  Administered 2022-03-09: 1 mL/kg/h via INTRAVENOUS

## 2022-03-09 MED ORDER — VERAPAMIL HCL 2.5 MG/ML IV SOLN
INTRA_ARTERIAL | Status: DC | PRN
  Administered 2022-03-09: 15 mL via INTRA_ARTERIAL

## 2022-03-09 MED ORDER — HYDRALAZINE HCL 20 MG/ML IJ SOLN
10.0000 mg | INTRAMUSCULAR | Status: AC | PRN
  Administered 2022-03-09 (×2): 10 mg via INTRAVENOUS
  Filled 2022-03-09 (×2): qty 1

## 2022-03-09 SURGICAL SUPPLY — 20 items
BALLN EMERGE MR 2.0X12 (BALLOONS) ×1
BALLN ~~LOC~~ EMERGE MR 3.25X12 (BALLOONS) ×1
BALLOON EMERGE MR 2.0X12 (BALLOONS) IMPLANT
BALLOON ~~LOC~~ EMERGE MR 3.25X12 (BALLOONS) IMPLANT
CATH INFINITI JR4 5F (CATHETERS) IMPLANT
CATH OPTITORQUE TIG 4.0 5F (CATHETERS) IMPLANT
CATH VISTA GUIDE 6FR XBLAD3.0 (CATHETERS) IMPLANT
DEVICE RAD COMP TR BAND LRG (VASCULAR PRODUCTS) IMPLANT
GLIDESHEATH SLEND A-KIT 6F 22G (SHEATH) IMPLANT
GUIDEWIRE INQWIRE 1.5J.035X260 (WIRE) IMPLANT
INQWIRE 1.5J .035X260CM (WIRE) ×1
KIT ENCORE 26 ADVANTAGE (KITS) IMPLANT
KIT HEART LEFT (KITS) ×2 IMPLANT
PACK CARDIAC CATHETERIZATION (CUSTOM PROCEDURE TRAY) ×2 IMPLANT
STENT SYNERGY XD 3.0X16 (Permanent Stent) IMPLANT
SYNERGY XD 3.0X16 (Permanent Stent) ×1 IMPLANT
TRANSDUCER W/STOPCOCK (MISCELLANEOUS) ×2 IMPLANT
TUBING CIL FLEX 10 FLL-RA (TUBING) ×2 IMPLANT
WIRE ASAHI PROWATER 180CM (WIRE) IMPLANT
WIRE HI TORQ VERSACORE-J 145CM (WIRE) IMPLANT

## 2022-03-09 NOTE — Progress Notes (Signed)
ANTICOAGULATION CONSULT NOTE  Pharmacy Consult for heparin Indication: chest pain/ACS  No Known Allergies  Patient Measurements: Height: 5\' 10"  (177.8 cm) Weight: 78.7 kg (173 lb 8 oz) IBW/kg (Calculated) : 73  Vital Signs: Temp: 98.2 F (36.8 C) (11/13 0555) Temp Source: Oral (11/13 0555) BP: 170/106 (11/13 0555) Pulse Rate: 68 (11/13 0626)  Labs: Recent Labs    03/06/22 1951 03/06/22 2150 03/07/22 0340 03/07/22 0704 03/07/22 0950 03/07/22 1807 03/08/22 0115 03/09/22 0142  HGB 16.6  --  15.1  --   --   --  15.0 15.0  HCT 50.7  --  47.2  --   --   --  45.1 47.6  PLT 235  --  210  --   --   --  200 184  HEPARINUNFRC  --   --   --   --    < > 0.58 0.55 0.33  CREATININE 1.33*  --   --   --   --   --   --  1.06  TROPONINIHS 16 110* 630* 619*  --   --   --   --    < > = values in this interval not displayed.     Estimated Creatinine Clearance: 80.3 mL/min (by C-G formula based on SCr of 1.06 mg/dL).   Medical History: Past Medical History:  Diagnosis Date   Hx of migraines    Hypertension    Myocardial infarction [facility])     Assessment: 56yo male c/o central CP associated w/ mild SOB, initial troponin negative but now rising >> to begin heparin.   Heparin level therapeutic, CBC stable. Cath planned today.  Goal of Therapy:  Heparin level 0.3-0.7 units/ml Monitor platelets by anticoagulation protocol: Yes   Plan:  Continue heparin 1300 units/hr Daily heparin level, CBC   56yo, PharmD, [place], [facility] Clinical Pharmacist [phone] Please check AMION for all [facility] Pharmacy numbers 03/09/2022

## 2022-03-09 NOTE — Progress Notes (Signed)
2200 dose of coreg not administered. Per pt he took 12.5 coreg from his home med supply located in room. Pt educated & home medications stored at charge RN desk

## 2022-03-09 NOTE — Progress Notes (Addendum)
Rounding Note    Patient Name: Lawrence Pope Date of Encounter: 03/09/2022  [facility] Cardiologist: Bryan Lemmaavid Harding, MD   Subjective   Intermittent chest pain.  No dyspnea.  He was noncompliant with his medication post PCI.   Inpatient Medications    Scheduled Meds:  aspirin  81 mg Oral Daily   atorvastatin  80 mg Oral Daily   carvedilol  12.5 mg Oral BID   isosorbide mononitrate  30 mg Oral Daily   losartan  50 mg Oral Daily   nicotine  14 mg Transdermal Daily   Continuous Infusions:  sodium chloride 1 mL/kg/hr (03/09/22 0630)   heparin 1,300 Units/hr (03/08/22 1415)   PRN Meds: acetaminophen, nitroGLYCERIN, ondansetron (ZOFRAN) IV, zolpidem   Vital Signs    Vitals:   03/08/22 2048 03/09/22 0555 03/09/22 0608 03/09/22 0626  BP: (!) 176/108 (!) 170/106    Pulse: 67 (!) 54 (!) 51 68  Resp:  16    Temp:  98.2 F (36.8 C)    TempSrc:  Oral    SpO2:  92% 96%   Weight:      Height:        Intake/Output Summary (Last 24 hours) at 03/09/2022 0748 Last data filed at 03/08/2022 2047 Gross per 24 hour  Intake 240 ml  Output 300 ml  Net -60 ml      03/07/2022    1:58 PM 03/07/2022    3:00 AM 01/28/2022    2:55 PM  Last 3 Weights  Weight (lbs) 173 lb 8 oz 180 lb 180 lb 11.2 oz  Weight (kg) 78.7 kg 81.647 kg 81.965 kg      Telemetry    Sinus rhythm- Personally Reviewed  ECG    Sinus rhythm, LVH with repolarization abnormality- Personally Reviewed  Physical Exam   GEN: No acute distress.   Neck: No JVD Cardiac: RRR, no murmurs, rubs, or gallops.  Respiratory: Clear to auscultation bilaterally. GI: Soft, nontender, non-distended  MS: No edema; No deformity. Neuro:  Nonfocal  Psych: Normal affect   Labs    High Sensitivity Troponin:   Recent Labs  Lab 03/06/22 1951 03/06/22 2150 03/07/22 0340 03/07/22 0704  TROPONINIHS 16 110* 630* 619*     Chemistry Recent Labs  Lab 03/06/22 1951 03/09/22 0142  NA 144 138  K 3.9 4.0   CL 107 108  CO2 28 20*  GLUCOSE 96 106*  BUN 10 10  CREATININE 1.33* 1.06  CALCIUM 9.8 9.0  GFRNONAA >60 >60  ANIONGAP 9 10    Hematology Recent Labs  Lab 03/07/22 0340 03/08/22 0115 03/09/22 0142  WBC 7.3 7.2 7.2  RBC 5.05 4.95 5.07  HGB 15.1 15.0 15.0  HCT 47.2 45.1 47.6  MCV 93.5 91.1 93.9  MCH 29.9 30.3 29.6  MCHC 32.0 33.3 31.5  RDW 14.4 14.4 14.5  PLT 210 200 184   Radiology    ECHOCARDIOGRAM COMPLETE  Result Date: 03/08/2022    ECHOCARDIOGRAM REPORT   Patient Name:   Lawrence Pope Date of Exam: 03/08/2022 Medical Rec #:  [id number]       Height:       70.0 in Accession #:    [id number]      Weight:       173.5 lb Date of Birth:  08/19/1965       BSA:          1.965 m Patient Age:    [age] years  BP:           178/89 mmHg Patient Gender: M               HR:           52 bpm. Exam Location:  Inpatient Procedure: 2D Echo, 3D Echo, Color Doppler, Cardiac Doppler and Strain Analysis Indications:    NSTEMI i21.4  History:        Patient has prior history of Echocardiogram examinations, most                 recent 01/14/2022. Risk Factors:Hypertension, Dyslipidemia and                 Polysubstance Abuse.  Sonographer:    Irving Burton Senior RDCS Referring Phys: [id number] FAN YE IMPRESSIONS  1. Left ventricular ejection fraction, by estimation, is 60 to 65%. Left ventricular ejection fraction by 3D volume is 62 %. The left ventricle has normal function. The left ventricle has no regional wall motion abnormalities. There is severe asymmetric  left ventricular hypertrophy of the basal-septal segment. Left ventricular diastolic parameters are consistent with Grade I diastolic dysfunction (impaired relaxation).  2. Right ventricular systolic function is normal. The right ventricular size is normal. Tricuspid regurgitation signal is inadequate for assessing PA pressure.  3. Left atrial size was mildly dilated.  4. Right atrial size was mildly dilated.  5. The mitral valve is grossly normal.  Trivial mitral valve regurgitation.  6. The aortic valve is tricuspid. Aortic valve regurgitation is trivial. Aortic regurgitation PHT measures 693 msec.  7. Aortic dilatation noted. There is mild dilatation of the ascending aorta, measuring 40 mm.  8. The inferior vena cava is normal in size with greater than 50% respiratory variability, suggesting right atrial pressure of 3 mmHg. Comparison(s): Changes from prior study are noted. 01/14/2022: LVEF 55-60%, severe basal septal hypertrophy, aorta 42 mm. FINDINGS  Left Ventricle: Left ventricular ejection fraction, by estimation, is 60 to 65%. Left ventricular ejection fraction by 3D volume is 62 %. The left ventricle has normal function. The left ventricle has no regional wall motion abnormalities. The left ventricular internal cavity size was normal in size. There is severe asymmetric left ventricular hypertrophy of the basal-septal segment. Left ventricular diastolic parameters are consistent with Grade I diastolic dysfunction (impaired relaxation). Indeterminate filling pressures. Right Ventricle: The right ventricular size is normal. No increase in right ventricular wall thickness. Right ventricular systolic function is normal. Tricuspid regurgitation signal is inadequate for assessing PA pressure. Left Atrium: Left atrial size was mildly dilated. Right Atrium: Right atrial size was mildly dilated. Pericardium: There is no evidence of pericardial effusion. Mitral Valve: The mitral valve is grossly normal. Trivial mitral valve regurgitation. Tricuspid Valve: The tricuspid valve is grossly normal. Tricuspid valve regurgitation is not demonstrated. Aortic Valve: The aortic valve is tricuspid. Aortic valve regurgitation is trivial. Aortic regurgitation PHT measures 693 msec. Pulmonic Valve: The pulmonic valve was normal in structure. Pulmonic valve regurgitation is not visualized. Aorta: Aortic dilatation noted. There is mild dilatation of the ascending aorta,  measuring 40 mm. Venous: The inferior vena cava is normal in size with greater than 50% respiratory variability, suggesting right atrial pressure of 3 mmHg. IAS/Shunts: No atrial level shunt detected by color flow Doppler.  LEFT VENTRICLE PLAX 2D LVIDd:         5.10 cm         Diastology LVIDs:         3.40 cm  LV e' medial:    5.00 cm/s LV PW:         0.90 cm         LV E/e' medial:  11.2 LV IVS:        1.70 cm         LV e' lateral:   8.16 cm/s LVOT diam:     2.50 cm         LV E/e' lateral: 6.9 LV SV:         163 LV SV Index:   83 LVOT Area:     4.91 cm        3D Volume EF                                LV 3D EF:    Left                                             ventricul                                             ar                                             ejection                                             fraction                                             by 3D                                             volume is                                             62 %.                                 3D Volume EF:                                3D EF:        62 %                                LV EDV:       116 ml  LV ESV:       44 ml                                LV SV:        72 ml RIGHT VENTRICLE RV S prime:     12.20 cm/s TAPSE (M-mode): 2.0 cm LEFT ATRIUM             Index        RIGHT ATRIUM           Index LA diam:        3.10 cm 1.58 cm/m   RA Area:     22.20 cm LA Vol (A2C):   70.4 ml 35.83 ml/m  RA Volume:   66.80 ml  34.00 ml/m LA Vol (A4C):   72.4 ml 36.85 ml/m LA Biplane Vol: 71.6 ml 36.44 ml/m  AORTIC VALVE LVOT Vmax:   146.00 cm/s LVOT Vmean:  111.000 cm/s LVOT VTI:    0.332 m AI PHT:      693 msec  AORTA Ao Root diam: 3.60 cm Ao Asc diam:  4.00 cm MITRAL VALVE MV Area (PHT): 2.42 cm    SHUNTS MV Decel Time: 314 msec    Systemic VTI:  0.33 m MV E velocity: 56.20 cm/s  Systemic Diam: 2.50 cm MV A velocity: 72.40 cm/s MV E/A ratio:  0.78 Zoila Shutter MD Electronically signed by Zoila Shutter MD Signature Date/Time: 03/08/2022/2:01:18 PM    Final     Cardiac Studies   Echo 03/08/2022 1. Left ventricular ejection fraction, by estimation, is 60 to 65%. Left  ventricular ejection fraction by 3D volume is 62 %. The left ventricle has  normal function. The left ventricle has no regional wall motion  abnormalities. There is severe asymmetric   left ventricular hypertrophy of the basal-septal segment. Left  ventricular diastolic parameters are consistent with Grade I diastolic  dysfunction (impaired relaxation).   2. Right ventricular systolic function is normal. The right ventricular  size is normal. Tricuspid regurgitation signal is inadequate for assessing  PA pressure.   3. Left atrial size was mildly dilated.   4. Right atrial size was mildly dilated.   5. The mitral valve is grossly normal. Trivial mitral valve  regurgitation.   6. The aortic valve is tricuspid. Aortic valve regurgitation is trivial.  Aortic regurgitation PHT measures 693 msec.   7. Aortic dilatation noted. There is mild dilatation of the ascending  aorta, measuring 40 mm.   8. The inferior vena cava is normal in size with greater than 50%  respiratory variability, suggesting right atrial pressure of 3 mmHg.   Comparison(s): Changes from prior study are noted. 01/14/2022: LVEF 55-60%,  severe basal septal hypertrophy, aorta 42 mm.   Patient Profile     56 y.o. 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  Patient reports, post PCI he moved to [facility] and was taking his medications intermittently.  He was taking Brilinta once a day.  Ran out of medication, recently started taking his blood pressure medication.  Assessment & Plan    Non-STEMI - Hs-troponin 16>>110>>630>>619. -Treated  with heparin -Mild intermittent chest pain -Echocardiogram with preserved LV function -Continue aspirin, statin, Imdur and beta-blocker -Given noncompliance history, he will benefit from once a day drug  greater than twice a day Brilinta if PCI performed -The patient understands that risks include but are not limited to stroke (1 in 1000), death (1 in 1000), kidney failure [usually temporary] (1 in 500), bleeding (1 in 200), allergic reaction [possibly serious] (1 in 200), and agrees to proceed.     2. Hypertension 3. Severe LVH - Elevated blood pressure.  He reports he was noncompliant with his medication but taking recently. - EKG & echocardiogram with LVH suggesting uncontrolled blood pressure for long time -Continue carvedilol 12.5 mg twice daily -Continue losartan 50 mg daily -Add amlodipine 10 mg daily  4. Polysubstance abuse Urine drug screen positive for cocaine and marijuana -Cessation recommended -Poor insight  5. Tobacco smoking On nicotine patch  6. HLD -01/14/2022: Cholesterol 136; HDL 35; LDL Cholesterol 87; Triglycerides 70; VLDL 14  - Continue Lipitor   For questions or updates, please contact [facility] Please consult [email or web address] for contact info under        Signed, Manson Passey, PA  03/09/2022, 7:48 AM     ATTENDING ATTESTATION:  After conducting a review of all available clinical information with the care team, interviewing the patient, and performing a physical exam, I agree with the findings and plan described in this note.   GEN: No acute distress.   HEENT:  MMM, no JVD, no scleral icterus Cardiac: RRR, no murmurs, rubs, or gallops.  Respiratory: Clear to auscultation bilaterally. GI: Soft, nontender, non-distended  MS: No edema; No deformity. Neuro:  Nonfocal  Vasc:  TR band R radial  Patient underwent coronary angiography demonstrating high grade mid LAD treated with DES x 1 and mild disease elsewhere.  Remains chest pain  free.  BP elevated; adding amlodipine 10mg . If not controlled tomorrow, will consider increasing losartan.  Continue DAPT, atorva 80, losartan, Coreg; will d/c Imdur after PCI.  Discussed at length need to abstain from illicit substances.   Anticipate d/c in AM.  , MD Pager [phone]

## 2022-03-09 NOTE — Interval H&P Note (Signed)
Cath Lab Visit (complete for each Cath Lab visit)  Clinical Evaluation Leading to the Procedure:   ACS: Yes.    Non-ACS:    Anginal Classification: CCS II  Anti-ischemic medical therapy: Minimal Therapy (1 class of medications)  Non-Invasive Test Results: No non-invasive testing performed  Prior CABG: No previous CABG      History and Physical Interval Note:  03/09/2022 8:55 AM  Lawrence Pope  has presented today for surgery, with the diagnosis of NSTEMI.  The various methods of treatment have been discussed with the patient and family. After consideration of risks, benefits and other options for treatment, the patient has consented to  Procedure(s): LEFT HEART CATH AND CORONARY ANGIOGRAPHY (N/A) as a surgical intervention.  The patient's history has been reviewed, patient examined, no change in status, stable for surgery.  I have reviewed the patient's chart and labs.  Questions were answered to the patient's satisfaction.     Lawrence Pope

## 2022-03-09 NOTE — Care Management (Signed)
  Transition of Care [facility]) Screening Note   Patient Details  Name: Larnie Heart Date of Birth: 08/16/1965   Transition of Care [facility]) CM/SW Contact:    Gala Lewandowsky, RN Phone Number: 03/09/2022, 12:31 PM    Transition of Care Department [facility]) has reviewed the patient. Patient is without PCP; patient is agreeable to Case Manager scheduling an appointment with the Internal Medicine Clinic. Appointment will be placed on the AVS. Case Manager will follow for [facility] assistance for medications. Case Manager  will continue to monitor patient advancement through interdisciplinary progression rounds. If new patient transition needs arise, please place a TOC consult.

## 2022-03-09 NOTE — Progress Notes (Signed)
Orthopedic Tech Progress Note Patient Details:  Lawrence Pope 03/14/1966 [id number]  Ortho Devices Type of Ortho Device: Velcro wrist splint Ortho Device/Splint Location: LUE Ortho Device/Splint Interventions: Ordered, Adjustment, Application   Post Interventions Patient Tolerated: Well Instructions Provided: Care of device  Donald Pore 03/09/2022, 11:52 AM

## 2022-03-10 ENCOUNTER — Telehealth: Payer: Self-pay | Admitting: Cardiology

## 2022-03-10 ENCOUNTER — Other Ambulatory Visit (HOSPITAL_COMMUNITY): Payer: Self-pay

## 2022-03-10 LAB — CBC
HCT: 46.5 % (ref 39.0–52.0)
Hemoglobin: 15 g/dL (ref 13.0–17.0)
MCH: 29.7 pg (ref 26.0–34.0)
MCHC: 32.3 g/dL (ref 30.0–36.0)
MCV: 92.1 fL (ref 80.0–100.0)
Platelets: 185 10*3/uL (ref 150–400)
RBC: 5.05 MIL/uL (ref 4.22–5.81)
RDW: 14.3 % (ref 11.5–15.5)
WBC: 6.3 10*3/uL (ref 4.0–10.5)
nRBC: 0 % (ref 0.0–0.2)

## 2022-03-10 LAB — BASIC METABOLIC PANEL
Anion gap: 7 (ref 5–15)
BUN: 11 mg/dL (ref 6–20)
CO2: 22 mmol/L (ref 22–32)
Calcium: 9 mg/dL (ref 8.9–10.3)
Chloride: 112 mmol/L — ABNORMAL HIGH (ref 98–111)
Creatinine, Ser: 1.17 mg/dL (ref 0.61–1.24)
GFR, Estimated: >60 mL/min (ref >60)
Glucose, Bld: 98 mg/dL (ref 70–99)
Potassium: 4 mmol/L (ref 3.5–5.1)
Sodium: 141 mmol/L (ref 135–145)

## 2022-03-10 LAB — POCT ACTIVATED CLOTTING TIME: Activated Clotting Time: 281 seconds

## 2022-03-10 MED ORDER — LOSARTAN POTASSIUM 50 MG PO TABS
50.0000 mg | ORAL_TABLET | Freq: Every day | ORAL | 3 refills | Status: DC
  Filled 2022-03-10: qty 30, 30d supply, fill #0
  Filled 2022-04-03: qty 30, 30d supply, fill #1

## 2022-03-10 MED ORDER — AMLODIPINE BESYLATE 5 MG PO TABS: 5.0000 mg | ORAL_TABLET | Freq: Every day | ORAL | Status: DC

## 2022-03-10 MED ORDER — CARVEDILOL 12.5 MG PO TABS
12.5000 mg | ORAL_TABLET | Freq: Two times a day (BID) | ORAL | 3 refills | Status: DC
  Filled 2022-03-10: qty 60, 30d supply, fill #0
  Filled 2022-04-03: qty 60, 30d supply, fill #1

## 2022-03-10 MED ORDER — NITROGLYCERIN 0.4 MG SL SUBL
0.4000 mg | SUBLINGUAL_TABLET | SUBLINGUAL | 3 refills | Status: DC | PRN
  Filled 2022-03-10: qty 25, 14d supply, fill #0
  Filled 2022-04-03: qty 25, 14d supply, fill #1

## 2022-03-10 MED ORDER — NICOTINE 14 MG/24HR TD PT24
14.0000 mg | MEDICATED_PATCH | Freq: Every day | TRANSDERMAL | 0 refills | Status: AC
  Filled 2022-03-10: qty 28, 28d supply, fill #0

## 2022-03-10 MED ORDER — ATORVASTATIN CALCIUM 80 MG PO TABS
80.0000 mg | ORAL_TABLET | Freq: Every day | ORAL | 3 refills | Status: DC
  Filled 2022-03-10: qty 30, 30d supply, fill #0
  Filled 2022-04-03: qty 30, 30d supply, fill #1

## 2022-03-10 MED ORDER — AMLODIPINE BESYLATE 5 MG PO TABS
5.0000 mg | ORAL_TABLET | Freq: Every day | ORAL | Status: DC
  Administered 2022-03-10: 5 mg via ORAL
  Filled 2022-03-10: qty 1

## 2022-03-10 MED ORDER — CLOPIDOGREL BISULFATE 75 MG PO TABS
600.0000 mg | ORAL_TABLET | Freq: Once | ORAL | Status: AC
  Administered 2022-03-10: 600 mg via ORAL
  Filled 2022-03-10: qty 8

## 2022-03-10 MED ORDER — CLOPIDOGREL BISULFATE 75 MG PO TABS
75.0000 mg | ORAL_TABLET | Freq: Every day | ORAL | 11 refills | Status: DC
  Filled 2022-03-10: qty 30, 30d supply, fill #0
  Filled 2022-04-03: qty 30, 30d supply, fill #1

## 2022-03-10 MED ORDER — CLOPIDOGREL BISULFATE 75 MG PO TABS: 75.0000 mg | ORAL_TABLET | Freq: Every day | ORAL | Status: DC

## 2022-03-10 MED ORDER — AMLODIPINE BESYLATE 5 MG PO TABS
5.0000 mg | ORAL_TABLET | Freq: Every day | ORAL | 3 refills | Status: DC
  Filled 2022-03-10: qty 30, 30d supply, fill #0
  Filled 2022-04-03: qty 30, 30d supply, fill #1

## 2022-03-10 MED ORDER — ASPIRIN 81 MG PO TBEC
81.0000 mg | DELAYED_RELEASE_TABLET | Freq: Every day | ORAL | 3 refills | Status: AC
  Filled 2022-03-10: qty 30, 30d supply, fill #0
  Filled 2022-04-03: qty 30, 30d supply, fill #1

## 2022-03-10 NOTE — Discharge Summary (Addendum)
Discharge Summary    Patient ID: Lawrence Pope MRN: [id number]; DOB: 01-Nov-1965  Admit date: 03/06/2022 Discharge date: 03/10/2022  PCP:  Teola Bradley, [place] Providers Cardiologist:  Glenetta Hew, MD   {   Discharge Diagnoses    Principal Problem:   NSTEMI (non-ST elevated myocardial infarction) [facility]) Active Problems:   Essential hypertension   Marijuana abuse   Hyperlipidemia   Tobacco use disorder  Diagnostic Studies/Procedures    Echo 03/08/2022 1. Left ventricular ejection fraction, by estimation, is 60 to 65%. Left  ventricular ejection fraction by 3D volume is 62 %. The left ventricle has  normal function. The left ventricle has no regional wall motion  abnormalities. There is severe asymmetric   left ventricular hypertrophy of the basal-septal segment. Left  ventricular diastolic parameters are consistent with Grade I diastolic  dysfunction (impaired relaxation).   2. Right ventricular systolic function is normal. The right ventricular  size is normal. Tricuspid regurgitation signal is inadequate for assessing  PA pressure.   3. Left atrial size was mildly dilated.   4. Right atrial size was mildly dilated.   5. The mitral valve is grossly normal. Trivial mitral valve  regurgitation.   6. The aortic valve is tricuspid. Aortic valve regurgitation is trivial.  Aortic regurgitation PHT measures 693 msec.   7. Aortic dilatation noted. There is mild dilatation of the ascending  aorta, measuring 40 mm.   8. The inferior vena cava is normal in size with greater than 50%  respiratory variability, suggesting right atrial pressure of 3 mmHg.   Comparison(s): Changes from prior study are noted. 01/14/2022: LVEF 55-60%,  severe basal septal hypertrophy, aorta 42 mm.     CORONARY STENT INTERVENTION  03/09/2022  LEFT HEART CATH AND CORONARY ANGIOGRAPHY   Conclusion      Dist LAD lesion is 50% stenosed.   Dist Cx lesion is 50%  stenosed.   Mid RCA lesion is 50% stenosed.   2nd Diag lesion is 40% stenosed.   Mid LAD to Dist LAD lesion is 99% stenosed.   Non-stenotic Prox RCA lesion was previously treated.   A drug-eluting stent was successfully placed using a SYNERGY XD 3.0X16.   Post intervention, there is a 0% residual stenosis.     IMPRESSION: Successful mid LAD PCI drug-eluting stenting with a 3 mm x 16 mm long Synergy drug-eluting stent deployed at 3.2 mm.  The previously placed RCA stent was widely patent and the lesion just beyond this was patent as well at 50 to 60% and smooth.  EF was normal by 2D echo.  Recommend DAPT uninterrupted for 12 months.  Stress cardiac resector modification including smoking cessation and avoidance of illicit drugs such as cocaine.  He left the lab in stable condition.   Diagnostic Dominance: Right  Intervention     History of Present Illness     Lawrence Pope is a 56 y.o. male with  past medical history CAD s/p DES to RCA 2022, hypertension, hyperlipidemia, tobacco and cocaine use disorders who admitted 03/07/2022 for the evaluation of chest pain.   Patient reports, post PCI he moved to [facility] and was taking his medications intermittently.  He was taking Brilinta once a day.  Ran out of medication, recently started taking his blood pressure medication.   Presents for intermittent mid chest dull pain without radiation.  The pain is aggravated with exertion and relieved with SL nitroglycerin.  He also reports bilateral arm tingling.  He  says his pain appears to be similar to the pain prior to his stent placement last year. Reports Stable weights. Denies fever, chills, dizziness, syncope, cough, heart palpitations, dyspnea, nausea, vomiting, abdominal fullness, dysuria, diarrhea, pedal edema or any bleeding events.  Currently he is chest pain-free, on room air, no acute distress.   Patient has had multiple ED visits for chest pain. His last ED visit in Sept with negative  troponins. However, his 2nd troponin on this admission was 110. ED requested cardiology service.   Patient admits snorts cocaine and continues smoking.   Hospital Course     Consultants: None  Non-STEMI - Hs-troponin 16>>110>>630>>619. -Treated with heparin -Mild intermittent chest pain -Echocardiogram with preserved LV function - Cath showed high grade mLAD stenosis s/p DES PCI. Patent RCA stent. Otherwise mild non obstructive CAD.  - Placed on ASA and Brillinta for DAPT. However patient reports non compliance with Brilinta in past due to BID dose and also reports concern about cost currently>> pharmacy reviewing cost and insurance coverage.  He already filled 30-day supply previously.  We will hold w/ Plavix and then maintenance dose. -Continue aspirin, Plavix statin and beta-blocker   2. Hypertension 3. Severe LVH - Elevated blood pressure.  He reports he was noncompliant with his medication but taking recently. - EKG & echocardiogram with LVH suggesting uncontrolled blood pressure for long time -Continue carvedilol 12.5 mg twice daily -Continue losartan 50 mg daily -Added amlodipine 5 mg daily today.  -BP has been improving    4. Polysubstance abuse -Urine drug screen positive for cocaine and marijuana -Cessation recommended -Poor insight   5. Tobacco smoking On nicotine patch   6. HLD -01/14/2022: Cholesterol 136; HDL 35; LDL Cholesterol 87; Triglycerides 70; VLDL 14  - Continue Lipitor      Did the patient have an acute coronary syndrome (MI, NSTEMI, STEMI, etc) this admission?:  Yes                               AHA/ACC Clinical Performance & Quality Measures: Aspirin prescribed? - Yes ADP Receptor Inhibitor (Plavix/Clopidogrel, Brilinta/Ticagrelor or Effient/Prasugrel) prescribed (includes medically managed patients)? - Yes Beta Blocker prescribed? - Yes High Intensity Statin (Lipitor 40-13m or Crestor 20-456m prescribed? - Yes EF assessed during THIS  hospitalization? - Yes For EF <40%, was ACEI/ARB prescribed? - Yes For EF <40%, Aldosterone Antagonist (Spironolactone or Eplerenone) prescribed? - Not Applicable (EF >/= 4062%Cardiac Rehab Phase II ordered (including medically managed patients)? - Yes     The patient will be scheduled for a TOC follow up appointment in 2-3 weeks.  [facility] at the office where the patient should be seen for follow up.   Discharge Vitals Blood pressure (!) 151/95, pulse 71, temperature 98.5 F (36.9 C), temperature source Oral, resp. rate 18, height _0  (1.778 m), weight 78.7 kg, SpO2 97 %.  Filed Weights   03/07/22 0300 03/07/22 1358  Weight: 81.6 kg 78.7 kg   Physical Exam Constitutional:      Appearance: He is well-developed.  HENT:     Head: Normocephalic.  Cardiovascular:     Rate and Rhythm: Normal rate and regular rhythm.     Heart sounds: Normal heart sounds.     Comments: Stable radial cath site  Pulmonary:     Effort: Pulmonary effort is normal.     Breath sounds: Normal breath sounds.  Abdominal:     Palpations: Abdomen is soft.  Musculoskeletal:        General: Normal range of motion.     Cervical back: Normal range of motion and neck supple.  Skin:    General: Skin is warm and dry.  Neurological:     General: No focal deficit present.     Mental Status: He is alert and oriented to person, place, and time.  Psychiatric:        Mood and Affect: Mood normal.        Behavior: Behavior normal.     Labs & Radiologic Studies    CBC Recent Labs    03/09/22 0142 03/10/22 0202  WBC 7.2 6.3  HGB 15.0 15.0  HCT 47.6 46.5  MCV 93.9 92.1  PLT 184 740   Basic Metabolic Panel Recent Labs    03/09/22 0142 03/10/22 0202  NA 138 141  K 4.0 4.0  CL 108 112*  CO2 20* 22  GLUCOSE 106* 98  BUN 10 11  CREATININE 1.06 1.17  CALCIUM 9.0 9.0   High Sensitivity Troponin:   Recent Labs  Lab 03/06/22 1951 03/06/22 2150  03/07/22 0340 03/07/22 0704  TROPONINIHS 16 110* 630* 619*   _____________  CARDIAC CATHETERIZATION  Result Date: 03/09/2022 Images from the original result were not included.   Dist LAD lesion is 50% stenosed.   Dist Cx lesion is 50% stenosed.   Mid RCA lesion is 50% stenosed.   2nd Diag lesion is 40% stenosed.   Mid LAD to Dist LAD lesion is 99% stenosed.   Non-stenotic Prox RCA lesion was previously treated.   A drug-eluting stent was successfully placed using a SYNERGY XD 3.0X16.   Post intervention, there is a 0% residual stenosis. Hatem Cull is a 56 y.o. male  [id number] LOCATION:  FACILITY: [facility] PHYSICIAN: Quay Burow, M.D. April 02, 1966 DATE OF PROCEDURE:  03/09/2022 DATE OF DISCHARGE: CARDIAC CATHETERIZATION / PCI DES LAD History obtained from chart review 56 year old African-American male with a history of hyperlipidemia, ongoing tobacco abuse, illicit drug abuse including cocaine and marijuana who had an inferior STEMI last year and underwent PCI drug-eluting stenting with a 4 mm x 38 mm long Synergy drug-eluting stent by Dr. Ellyn Hack.  He had minimal disease otherwise.  He presented over the weekend with chest pain.  His enzymes were positive.  He presents now for diagnostic coronary angiography. PROCEDURE DESCRIPTION: The patient was brought to the second floor [facility] cath lab in the postabsorptive state. He was premedicated with IV Versed and fentanyl. His right wrist was prepped and shaved in usual sterile fashion. Xylocaine 1% was used for local anesthesia. A 6 French sheath was inserted into the right radial artery using standard Seldinger technique. The patient received 4000 units  of heparin intravenously.  A 5 [place] TIG catheter and right Judkins catheters were used for selective coronary angiography and obtain left heart pressures.  Isovue dye is used for the entirety of the case (155 cc of contrast total to patient).  Retrograde aortic, left ventricular pullback  pressures were recorded (LVEDP was 8).  Radial cocktail was administered via the SideArm sheath. Patient received a total of 10,000 units of heparin with an ACT of 280.  He received Brilinta 180 mg p.o. loading dose.  Isovue dye is used for the entirety of the intervention.  Retrograde ordered pressures monitored during the case. Using a 6 French XB 3.0 cm guide cath along with a 0.14 [place] guidewire  I was able to cross the tortuous mid LAD lesion with some difficulty.  I used a 2 mm x 12 mm long balloon as backup.  I then predilated the diseased segment with balloon and placed a 3 mm x 16 mm long Synergy drug-eluting stent across the diseased segment deploying at 16 atm (3.2 mm) resulting reduction of a 99% thrombotic mid LAD stenosis to 0% residual.  There was not area of "pseudo dissection" which responded to intracoronary nitroglycerin and pulling back the guidewire.   Successful mid LAD PCI drug-eluting stenting with a 3 mm x 16 mm long Synergy drug-eluting stent deployed at 3.2 mm.  The previously placed RCA stent was widely patent and the lesion just beyond this was patent as well at 50 to 60% and smooth.  EF was normal by 2D echo.  Recommend DAPT uninterrupted for 12 months.  Stress cardiac resector modification including smoking cessation and avoidance of illicit drugs such as cocaine.  He left the lab in stable condition. Quay Burow. MD, [facility] 03/09/2022 10:06 AM    ECHOCARDIOGRAM COMPLETE  Result Date: 03/08/2022    ECHOCARDIOGRAM REPORT   Patient Name:   Ralston Pulliam Date of Exam: 03/08/2022 Medical Rec #:  [id number]       Height:       70.0 in Accession #:    [id number]      Weight:       173.5 lb Date of Birth:  1965/09/27       BSA:          1.965 m Patient Age:    19 years        BP:           178/89 mmHg Patient Gender: M               HR:           52 bpm. Exam Location:  Inpatient Procedure: 2D Echo, 3D Echo, Color Doppler, Cardiac Doppler and Strain Analysis Indications:    NSTEMI  i21.4  History:        Patient has prior history of Echocardiogram examinations, most                 recent 01/14/2022. Risk Factors:Hypertension, Dyslipidemia and                 Polysubstance Abuse.  Sonographer:    Raquel Sarna Senior RDCS Referring Phys: [id number] FAN YE IMPRESSIONS  1. Left ventricular ejection fraction, by estimation, is 60 to 65%. Left ventricular ejection fraction by 3D volume is 62 %. The left ventricle has normal function. The left ventricle has no regional wall motion abnormalities. There is severe asymmetric  left ventricular hypertrophy of the basal-septal segment. Left ventricular diastolic parameters are consistent with Grade I diastolic dysfunction (impaired relaxation).  2. Right ventricular systolic function is normal. The right ventricular size is normal. Tricuspid regurgitation signal is inadequate for assessing PA pressure.  3. Left atrial size was mildly dilated.  4. Right atrial size was mildly dilated.  5. The mitral valve is grossly normal. Trivial mitral valve regurgitation.  6. The aortic valve is tricuspid. Aortic valve regurgitation is trivial. Aortic regurgitation PHT measures 693 msec.  7. Aortic dilatation noted. There is mild dilatation of the ascending aorta, measuring 40 mm.  8. The inferior vena cava is normal in size with greater than 50% respiratory variability, suggesting right atrial pressure of 3 mmHg. Comparison(s): Changes from prior study are noted. 01/14/2022: LVEF 55-60%,  severe basal septal hypertrophy, aorta 42 mm. FINDINGS  Left Ventricle: Left ventricular ejection fraction, by estimation, is 60 to 65%. Left ventricular ejection fraction by 3D volume is 62 %. The left ventricle has normal function. The left ventricle has no regional wall motion abnormalities. The left ventricular internal cavity size was normal in size. There is severe asymmetric left ventricular hypertrophy of the basal-septal segment. Left ventricular diastolic parameters are consistent with  Grade I diastolic dysfunction (impaired relaxation). Indeterminate filling pressures. Right Ventricle: The right ventricular size is normal. No increase in right ventricular wall thickness. Right ventricular systolic function is normal. Tricuspid regurgitation signal is inadequate for assessing PA pressure. Left Atrium: Left atrial size was mildly dilated. Right Atrium: Right atrial size was mildly dilated. Pericardium: There is no evidence of pericardial effusion. Mitral Valve: The mitral valve is grossly normal. Trivial mitral valve regurgitation. Tricuspid Valve: The tricuspid valve is grossly normal. Tricuspid valve regurgitation is not demonstrated. Aortic Valve: The aortic valve is tricuspid. Aortic valve regurgitation is trivial. Aortic regurgitation PHT measures 693 msec. Pulmonic Valve: The pulmonic valve was normal in structure. Pulmonic valve regurgitation is not visualized. Aorta: Aortic dilatation noted. There is mild dilatation of the ascending aorta, measuring 40 mm. Venous: The inferior vena cava is normal in size with greater than 50% respiratory variability, suggesting right atrial pressure of 3 mmHg. IAS/Shunts: No atrial level shunt detected by color flow Doppler.  LEFT VENTRICLE PLAX 2D LVIDd:         5.10 cm         Diastology LVIDs:         3.40 cm         LV e' medial:    5.00 cm/s LV PW:         0.90 cm         LV E/e' medial:  11.2 LV IVS:        1.70 cm         LV e' lateral:   8.16 cm/s LVOT diam:     2.50 cm         LV E/e' lateral: 6.9 LV SV:         163 LV SV Index:   83 LVOT Area:     4.91 cm        3D Volume EF                                LV 3D EF:    Left                                             ventricul                                             ar                                             ejection  fraction                                             by 3D                                             volume is                                              62 %.                                 3D Volume EF:                                3D EF:        62 %                                LV EDV:       116 ml                                LV ESV:       44 ml                                LV SV:        72 ml RIGHT VENTRICLE RV S prime:     12.20 cm/s TAPSE (M-mode): 2.0 cm LEFT ATRIUM             Index        RIGHT ATRIUM           Index LA diam:        3.10 cm 1.58 cm/m   RA Area:     22.20 cm LA Vol (A2C):   70.4 ml 35.83 ml/m  RA Volume:   66.80 ml  34.00 ml/m LA Vol (A4C):   72.4 ml 36.85 ml/m LA Biplane Vol: 71.6 ml 36.44 ml/m  AORTIC VALVE LVOT Vmax:   146.00 cm/s LVOT Vmean:  111.000 cm/s LVOT VTI:    0.332 m AI PHT:      693 msec  AORTA Ao Root diam: 3.60 cm Ao Asc diam:  4.00 cm MITRAL VALVE MV Area (PHT): 2.42 cm    SHUNTS MV Decel Time: 314 msec    Systemic VTI:  0.33 m MV E velocity: 56.20 cm/s  Systemic Diam: 2.50 cm MV A velocity: 72.40 cm/s MV E/A ratio:  0.78 Lyman Bishop MD Electronically signed by Lyman Bishop MD Signature Date/Time: 03/08/2022/2:01:18 PM    Final    DG Chest 2 View  Result Date: 03/06/2022 CLINICAL DATA:  Chest pain and mild shortness of breath. EXAM: CHEST - 2 VIEW COMPARISON:  01/13/2022. FINDINGS: The heart size and mediastinal contours are within normal limits. Both lungs are clear. Mild degenerative changes  are present in the thoracic spine. IMPRESSION: No active cardiopulmonary disease. Electronically Signed   By: Brett Fairy M.D.   On: 03/06/2022 20:02   Disposition   Pt is being discharged home today in good condition.  Follow-up Plans & Appointments     Discharge Instructions     Amb Referral to Cardiac Rehabilitation   Complete by: As directed    Diagnosis:  Coronary Stents NSTEMI     After initial evaluation and assessments completed: Virtual Based Care may be provided alone or in conjunction with Phase 2 Cardiac Rehab based on patient barriers.: Yes   Intensive  Cardiac Rehabilitation (ICR) [place] location only OR Traditional Cardiac Rehabilitation (TCR) *If criteria for ICR are not met will enroll in TCR [facility] only): Yes   Diet - low sodium heart healthy   Complete by: As directed    Discharge instructions   Complete by: As directed    NO HEAVY LIFTING (>10lbs) X 2 WEEKS. NO SEXUAL ACTIVITY X 2 WEEKS. NO DRIVING X 1 WEEK. NO SOAKING BATHS, HOT TUBS, POOLS, ETC., X 7 DAYS.   Increase activity slowly   Complete by: As directed         Discharge Medications   Allergies as of 03/10/2022   No Known Allergies      Medication List     STOP taking these medications    albuterol 108 (90 Base) MCG/ACT inhaler Commonly known as: VENTOLIN HFA   Aspirin Low Dose 81 MG chewable tablet Generic drug: aspirin Replaced by: aspirin EC 81 MG tablet   isosorbide mononitrate 30 MG 24 hr tablet Commonly known as: IMDUR       TAKE these medications    amLODipine 5 MG tablet Commonly known as: NORVASC Take 1 tablet (5 mg total) by mouth daily.   aspirin EC 81 MG tablet Take 1 tablet (81 mg total) by mouth daily. Swallow whole. Replaces: Aspirin Low Dose 81 MG chewable tablet   atorvastatin 80 MG tablet Commonly known as: Lipitor Take 1 tablet (80 mg total) by mouth daily.   carvedilol 12.5 MG tablet Commonly known as: Coreg Take 1 tablet (12.5 mg total) by mouth 2 (two) times daily.   clopidogrel 75 MG tablet Commonly known as: PLAVIX Take 1 tablet (75 mg total) by mouth daily. Start taking on: March 11, 2022   losartan 50 MG tablet Commonly known as: Cozaar Take 1 tablet (50 mg total) by mouth daily.   nicotine 14 mg/24hr patch Commonly known as: NICODERM CQ - dosed in mg/24 hours Place 1 patch (14 mg total) onto the skin daily.   nitroGLYCERIN 0.4 MG SL tablet Commonly known as: Nitrostat Place 1 tablet (0.4 mg total) under the tongue every 5 (five) minutes as needed for chest pain.           Outstanding Labs/Studies    None   Duration of Discharge Encounter   Greater than 30 minutes including physician time.  Jarrett Soho, PA 03/10/2022, 8:43 AM  Patient seen, examined. Available data reviewed. Agree with findings, assessment, and plan as outlined by Robbie Lis, PA.  The patient is independently interviewed and examined.  He is alert, oriented, no distress.  Lungs are clear, heart is regular rate and rhythm no murmur gallop, abdomen soft nontender, extremities have no edema, right radial site is clear.  Cardiac catheterization data reviewed.  I agree with the discharge medications and plan as outlined.  We discussed the importance of risk factor modification and  tobacco abuse as well as cessation of all substance abuse.  The patient understands.  We will discharge him on clopidogrel as he is unable to afford ticagrelor.  He understands the importance of medication compliance.  He is medically stable for discharge today.  Sherren Mocha, M.D. 03/10/2022 10:14 AM

## 2022-03-10 NOTE — Telephone Encounter (Signed)
Patient is schd for a TOC appt on 11/29 at 10:05 with Juanda Crumble. Please advise

## 2022-03-10 NOTE — Progress Notes (Signed)
CARDIAC REHAB PHASE I   PRE:  Rate/Rhythm: 67 SR w PVCs  BP:  Sitting: 169/102      SaO2: 96 RA  MODE:  Ambulation: 360 ft   POST:  Rate/Rhythm: 76 SR  BP:  Sitting: 170/94      SaO2: 96 RA  Pt ambulated 385ft with standby assistance w/o AD. Pt asymptotic throughout ambulation, RPE 6. Pt was educated on stent location, asa and Birlinta use, Plavix if needed, risk factors (lower LDL, smoking, drinking cessation). Wt restrictions, no baths/daily wash-ups, s/s of infection, ex guidelines, s/s to stop exercising, ntg use and calling 911, and heart healthy diet. Pt will be referred to meet referral guidelines. Pt will be referred to [facility].   [phone]  Faustino Congress  8:39 AM 03/10/2022

## 2022-03-11 NOTE — Telephone Encounter (Signed)
Called pt for TOC call. No answer left msg to call back.

## 2022-03-17 ENCOUNTER — Encounter: Payer: Self-pay | Admitting: Student

## 2022-03-17 NOTE — Progress Notes (Deleted)
Non-STEMI - Hs-troponin 16>>110>>630>>619. -Treated with heparin -Mild intermittent chest pain -Echocardiogram with preserved LV function - Cath showed high grade mLAD stenosis s/p DES PCI. Patent RCA stent. Otherwise mild non obstructive CAD.  - Placed on ASA and Brillinta for DAPT. However patient reports non compliance with Brilinta in past due to BID dose and also reports concern about cost currently>> pharmacy reviewing cost and insurance coverage.  He already filled 30-day supply previously.  We will hold w/ Plavix and then maintenance dose. -Continue aspirin, Plavix statin and beta-blocker   2. Hypertension 3. Severe LVH - Elevated blood pressure.  He reports he was noncompliant with his medication but taking recently. - EKG & echocardiogram with LVH suggesting uncontrolled blood pressure for long time -Continue carvedilol 12.5 mg twice daily -Continue losartan 50 mg daily -Added amlodipine 5 mg daily today.  -BP has been improving    4. Polysubstance abuse -Urine drug screen positive for cocaine and marijuana -Cessation recommended -Poor insight   5. Tobacco smoking On nicotine patch   6. HLD -01/14/2022: Cholesterol 136; HDL 35; LDL Cholesterol 87; Triglycerides 70; VLDL 14  - Continue Lipitor

## 2022-03-18 ENCOUNTER — Encounter: Payer: Self-pay | Admitting: Family Medicine

## 2022-03-18 NOTE — Telephone Encounter (Signed)
LMTCB

## 2022-03-24 NOTE — Progress Notes (Deleted)
Cardiology Office Note:    Date:  03/24/2022   ID:  Lawrence Pope, DOB Jan 27, 1966, MRN [id number]  PCP:  Reinaldo Raddle, MD (Inactive) [facility] Cardiologist: Bryan Lemma, MD   Reason for visit: Hospital follow-up  History of Present Illness:    Lawrence Pope is a 56 y.o. male with a hx of CAD s/p DES to RCA 2022, hypertension, hyperlipidemia, tobacco and cocaine use admitted 03/07/22 for chest pain worse with exertion - similar to before last PCI.  Admitted to intermittent medication use occluding Brilinta.  +NSTEMI.  Normal EF.    Cath showed high grade mLAD stenosis s/p DES PCI. Patent RCA stent. Otherwise mild non obstructive CAD.  For once LE dosing, he was changed to Plavix.  Patient noted to have elevated blood pressure and severe LVH.  Amlodipine was added.  Drug use is positive for cocaine and marijuana.  ?research study pt  Today, ***  Coronary artery disease -LHC 02/2022 with high grade mLAD stenosis s/p DES PCI. -***  Hypertension -*** -Goal BP is <130/80.  Recommend DASH diet (high in vegetables, fruits, low-fat dairy products, whole grains, poultry, fish, and nuts and low in sweets, sugar-sweetened beverages, and red meats), salt restriction and increase physical activity.  Hyperlipidemia -01/14/2022: Cholesterol 136; HDL 35; LDL Cholesterol 87; Triglycerides 70; VLDL 14   -*** -Discussed cholesterol lowering diets - Mediterranean diet, DASH diet, vegetarian diet, low-carbohydrate diet and avoidance of trans fats.  Discussed healthier choice substitutes.  Nuts, high-fiber foods, and fiber supplements may also improve lipids.    Obesity -Discussed how even a 5-10% weight loss can have cardiovascular benefits.   -Recommend moderate intensity activity for 30 minutes 5 days/week and the DASH diet.  Tobacco use  -Recommend tobacco cessation.  Reviewed physiologic effects of nicotine and the immediate-eventual benefits of quitting including  improvement in cough/breathing and reduction in cardiovascular events.  Discussed quitting tips such as removing triggers and getting support from family/friends and [employer] [place]. -USPSTF recommends one-time screening for abdominal aortic aneurysm (AAA) by ultrasound in men 73 -65 years old who have ever smoked.      Disposition - Follow-up in ***     Past Medical History:  Diagnosis Date   Hx of migraines    Hypertension    Myocardial infarction [facility])     Past Surgical History:  Procedure Laterality Date   CORONARY STENT INTERVENTION N/A 03/09/2022   Procedure: CORONARY STENT INTERVENTION;  Surgeon: Runell Gess, MD;  Location: [facility] INVASIVE CV LAB;  Service: Cardiovascular;  Laterality: N/A;   CORONARY/GRAFT ACUTE MI REVASCULARIZATION N/A 01/28/2021   Procedure: Coronary/Graft Acute MI Revascularization;  Surgeon: Marykay Lex, MD;  Location: [facility] INVASIVE CV LAB;  Service: Cardiovascular;  Laterality: N/A;   LEFT HEART CATH AND CORONARY ANGIOGRAPHY N/A 01/28/2021   Procedure: LEFT HEART CATH AND CORONARY ANGIOGRAPHY;  Surgeon: Marykay Lex, MD;  Location: [facility] INVASIVE CV LAB;  Service: Cardiovascular;  Laterality: N/A;   LEFT HEART CATH AND CORONARY ANGIOGRAPHY N/A 03/09/2022   Procedure: LEFT HEART CATH AND CORONARY ANGIOGRAPHY;  Surgeon: Runell Gess, MD;  Location: [facility] INVASIVE CV LAB;  Service: Cardiovascular;  Laterality: N/A;    Current Medications: No outpatient medications have been marked as taking for the 03/25/22 encounter (Appointment) with Cannon Kettle, PA-C.     Allergies:   Patient has no known allergies.   Social History   Socioeconomic History   Marital status: Legally Separated    Spouse name: Not on  file   Number of children: Not on file   Years of education: Not on file   Highest education level: Not on file  Occupational History   Not on file  Tobacco Use   Smoking status: Every Day    Packs/day: 1.00    Types: Cigarettes    Smokeless tobacco: Never  Substance and Sexual Activity   Alcohol use: Yes    Alcohol/week: 8.0 standard drinks of alcohol    Types: 8 Cans of beer per week    Comment: occ   Drug use: Not Currently   Sexual activity: Not on file  Other Topics Concern   Not on file  Social History Narrative   Not on file   Social Determinants of Health   Financial Resource Strain: Not on file  Food Insecurity: No Food Insecurity (03/07/2022)   Hunger Vital Sign    Worried About Running Out of Food in the Last Year: Never true    Ran Out of Food in the Last Year: Never true  Transportation Needs: No Transportation Needs (03/07/2022)   PRAPARE - Administrator, Civil Service (Medical): No    Lack of Transportation (Non-Medical): No  Physical Activity: Not on file  Stress: Not on file  Social Connections: Not on file     Family History: The patient's family history includes Cancer in his father; Heart attack in his mother; Hypertension in his father and mother.  ROS:   Please see the history of present illness.     EKGs/Labs/Other Studies Reviewed:    EKG:  The ekg ordered today demonstrates ***  Recent Labs: 01/13/2022: ALT 13 03/10/2022: BUN 11; Creatinine, Ser 1.17; Hemoglobin 15.0; Platelets 185; Potassium 4.0; Sodium 141   Recent Lipid Panel Lab Results  Component Value Date/Time   CHOL 136 01/14/2022 03:56 AM   TRIG 70 01/14/2022 03:56 AM   HDL 35 (L) 01/14/2022 03:56 AM   LDLCALC 87 01/14/2022 03:56 AM    Physical Exam:    VS:  There were no vitals taken for this visit.   No data found.  No BP recorded.  {Refresh Note OR Click here to enter BP  :1}***    Wt Readings from Last 3 Encounters:  03/07/22 173 lb 8 oz (78.7 kg)  01/28/22 180 lb 11.2 oz (82 kg)  01/13/22 182 lb (82.6 kg)     GEN: *** Well nourished, well developed in no acute distress HEENT: Normal NECK: No JVD; No carotid bruits CARDIAC: ***RRR, no murmurs, rubs, gallops RESPIRATORY:  Clear  to auscultation without rales, wheezing or rhonchi  ABDOMEN: Soft, non-tender, non-distended MUSCULOSKELETAL: No edema SKIN: Warm and dry NEUROLOGIC:  Alert and oriented PSYCHIATRIC:  Normal affect     ASSESSMENT AND PLAN   ***   {Are you ordering a CV Procedure (e.g. stress test, cath, DCCV, TEE, etc)?   Press F2        :[id number]}    Medication Adjustments/Labs and Tests Ordered: Current medicines are reviewed at length with the patient today.  Concerns regarding medicines are outlined above.  No orders of the defined types were placed in this encounter.  No orders of the defined types were placed in this encounter.   There are no Patient Instructions on file for this visit.   Signed, Cannon Kettle, PA-C  03/24/2022 1:53 PM    [facility]

## 2022-03-25 ENCOUNTER — Ambulatory Visit: Payer: Self-pay | Attending: Physician Assistant | Admitting: Physician Assistant

## 2022-03-25 DIAGNOSIS — I10 Essential (primary) hypertension: Secondary | ICD-10-CM

## 2022-03-25 DIAGNOSIS — Z720 Tobacco use: Secondary | ICD-10-CM

## 2022-03-25 DIAGNOSIS — I2510 Atherosclerotic heart disease of native coronary artery without angina pectoris: Secondary | ICD-10-CM

## 2022-03-25 DIAGNOSIS — F1490 Cocaine use, unspecified, uncomplicated: Secondary | ICD-10-CM

## 2022-03-25 DIAGNOSIS — E785 Hyperlipidemia, unspecified: Secondary | ICD-10-CM

## 2022-04-01 ENCOUNTER — Emergency Department (HOSPITAL_BASED_OUTPATIENT_CLINIC_OR_DEPARTMENT_OTHER)
Admission: EM | Admit: 2022-04-01 | Discharge: 2022-04-02 | Discharge status: Against Medical Advice | Payer: Self-pay | Attending: Emergency Medicine | Admitting: Emergency Medicine

## 2022-04-01 ENCOUNTER — Other Ambulatory Visit: Payer: Self-pay

## 2022-04-01 ENCOUNTER — Encounter (HOSPITAL_BASED_OUTPATIENT_CLINIC_OR_DEPARTMENT_OTHER): Payer: Self-pay | Admitting: Emergency Medicine

## 2022-04-01 DIAGNOSIS — Z5321 Procedure and treatment not carried out due to patient leaving prior to being seen by health care provider: Secondary | ICD-10-CM | POA: Insufficient documentation

## 2022-04-01 DIAGNOSIS — M79645 Pain in left finger(s): Secondary | ICD-10-CM | POA: Insufficient documentation

## 2022-04-01 NOTE — ED Triage Notes (Signed)
Pt reports LT thumb pain x 1 1/2 wks; denies injury, thinks it's d/t job he is doing; currently wearing velcro splint

## 2022-04-01 NOTE — ED Notes (Signed)
Pt refuses XR at this time d/t cost concerns

## 2022-04-02 ENCOUNTER — Encounter (HOSPITAL_BASED_OUTPATIENT_CLINIC_OR_DEPARTMENT_OTHER): Payer: Self-pay | Admitting: Urology

## 2022-04-02 ENCOUNTER — Emergency Department (HOSPITAL_BASED_OUTPATIENT_CLINIC_OR_DEPARTMENT_OTHER)
Admission: EM | Admit: 2022-04-02 | Discharge: 2022-04-02 | Discharge status: Against Medical Advice | Payer: Self-pay | Attending: Emergency Medicine | Admitting: Emergency Medicine

## 2022-04-02 ENCOUNTER — Encounter: Payer: Self-pay | Admitting: Physician Assistant

## 2022-04-02 DIAGNOSIS — Z5321 Procedure and treatment not carried out due to patient leaving prior to being seen by health care provider: Secondary | ICD-10-CM | POA: Insufficient documentation

## 2022-04-02 DIAGNOSIS — M79645 Pain in left finger(s): Secondary | ICD-10-CM | POA: Insufficient documentation

## 2022-04-02 NOTE — ED Notes (Signed)
Patient not found in exam room x3

## 2022-04-02 NOTE — ED Triage Notes (Signed)
Pt c/o left thumb pain that started 2 years ago  Denies any new injury  Is wearing a velcro splint at this time  H/o carpal tunnel

## 2022-04-03 ENCOUNTER — Other Ambulatory Visit (HOSPITAL_COMMUNITY): Payer: Self-pay

## 2022-05-22 ENCOUNTER — Encounter: Payer: Self-pay | Admitting: Nurse Practitioner

## 2022-05-22 ENCOUNTER — Ambulatory Visit: Payer: Commercial Managed Care - HMO | Attending: Nurse Practitioner | Admitting: Nurse Practitioner

## 2022-05-22 VITALS — BP 156/88 | HR 65 | Ht 71.0 in | Wt 173.4 lb

## 2022-05-22 DIAGNOSIS — I10 Essential (primary) hypertension: Secondary | ICD-10-CM

## 2022-05-22 DIAGNOSIS — F1910 Other psychoactive substance abuse, uncomplicated: Secondary | ICD-10-CM

## 2022-05-22 DIAGNOSIS — I2510 Atherosclerotic heart disease of native coronary artery without angina pectoris: Secondary | ICD-10-CM | POA: Diagnosis not present

## 2022-05-22 DIAGNOSIS — E785 Hyperlipidemia, unspecified: Secondary | ICD-10-CM

## 2022-05-22 DIAGNOSIS — F17200 Nicotine dependence, unspecified, uncomplicated: Secondary | ICD-10-CM | POA: Diagnosis not present

## 2022-05-22 MED ORDER — CARVEDILOL 12.5 MG PO TABS: 12.5000 mg | ORAL_TABLET | Freq: Two times a day (BID) | ORAL | 3 refills | Status: DC

## 2022-05-22 MED ORDER — ATORVASTATIN CALCIUM 80 MG PO TABS: 80.0000 mg | ORAL_TABLET | Freq: Every day | ORAL | 3 refills | Status: DC

## 2022-05-22 MED ORDER — LOSARTAN POTASSIUM 50 MG PO TABS: 50.0000 mg | ORAL_TABLET | Freq: Every day | ORAL | 3 refills | Status: AC

## 2022-05-22 MED ORDER — AMLODIPINE BESYLATE 5 MG PO TABS: 5.0000 mg | ORAL_TABLET | Freq: Every day | ORAL | 3 refills | Status: DC

## 2022-05-22 MED ORDER — NITROGLYCERIN 0.4 MG SL SUBL: 0.4000 mg | SUBLINGUAL_TABLET | SUBLINGUAL | 3 refills | Status: AC | PRN

## 2022-05-22 MED ORDER — CLOPIDOGREL BISULFATE 75 MG PO TABS: 75.0000 mg | ORAL_TABLET | Freq: Every day | ORAL | 11 refills | Status: AC

## 2022-05-22 NOTE — Patient Instructions (Signed)
Medication Instructions:  Your physician recommends that you continue on your current medications as directed. Please refer to the Current Medication list given to you today.  *If you need a refill on your cardiac medications before your next appointment, please call your pharmacy*   Lab Work: NONE ordered at this time of appointment   If you have labs (blood work) drawn today and your tests are completely normal, you will receive your results only by: [place] (if you have MyChart) OR A paper copy in the mail If you have any lab test that is abnormal or we need to change your treatment, we will call you to review the results.   Testing/Procedures: NONE ordered at this time of appointment     Follow-Up: At [facility], you and your health needs are our priority.  As part of our continuing mission to provide you with exceptional heart care, we have created designated Provider Care Teams.  These Care Teams include your primary Cardiologist (physician) and Advanced Practice Providers (APPs -  Physician Assistants and Nurse Practitioners) who all work together to provide you with the care you need, when you need it.  We recommend signing up for the patient portal called "MyChart".  Sign up information is provided on this After Visit Summary.  MyChart is used to connect with patients for Virtual Visits (Telemedicine).  Patients are able to view lab/test results, encounter notes, upcoming appointments, etc.  Non-urgent messages can be sent to your provider as well.   To learn more about what you can do with MyChart, go to [email or web address].    Your next appointment:   6-8 week(s)  Provider:   Glenetta Hew, MD  or Diona Browner, NP        Other Instructions

## 2022-05-22 NOTE — Progress Notes (Signed)
Office Visit    Patient Name: Lawrence Pope Date of Encounter: 05/22/2022  Primary Care Provider:  Angelique Blonder, DO Primary Cardiologist:  Glenetta Hew, MD  Chief Complaint    57 year old male with a history of CAD s/p STEMI, DES-RCA in 01/2021 and DES-LAD in 02/2022, hypertension, hyperlipidemia, tobacco use, and polysubstance use who presents for follow-up related to CAD.  Past Medical History    Past Medical History:  Diagnosis Date   Hx of migraines    Hypertension    Myocardial infarction [facility])    Past Surgical History:  Procedure Laterality Date   CORONARY STENT INTERVENTION N/A 03/09/2022   Procedure: CORONARY STENT INTERVENTION;  Surgeon: Lorretta Harp, MD;  Location: [place] CV LAB;  Service: Cardiovascular;  Laterality: N/A;   CORONARY/GRAFT ACUTE MI REVASCULARIZATION N/A 01/28/2021   Procedure: Coronary/Graft Acute MI Revascularization;  Surgeon: Leonie Man, MD;  Location: [place] CV LAB;  Service: Cardiovascular;  Laterality: N/A;   LEFT HEART CATH AND CORONARY ANGIOGRAPHY N/A 01/28/2021   Procedure: LEFT HEART CATH AND CORONARY ANGIOGRAPHY;  Surgeon: Leonie Man, MD;  Location: [place] CV LAB;  Service: Cardiovascular;  Laterality: N/A;   LEFT HEART CATH AND CORONARY ANGIOGRAPHY N/A 03/09/2022   Procedure: LEFT HEART CATH AND CORONARY ANGIOGRAPHY;  Surgeon: Lorretta Harp, MD;  Location: [place] CV LAB;  Service: Cardiovascular;  Laterality: N/A;    Allergies  No Known Allergies   Labs/Other Studies Reviewed    The following studies were reviewed today: Echo 03/08/2022 1. Left ventricular ejection fraction, by estimation, is 60 to 65%. Left  ventricular ejection fraction by 3D volume is 62 %. The left ventricle has  normal function. The left ventricle has no regional wall motion  abnormalities. There is severe asymmetric left ventricular hypertrophy of the basal-septal segment. Left ventricular diastolic parameters are  consistent with Grade I diastolic dysfunction (impaired relaxation).   2. Right ventricular systolic function is normal. The right ventricular  size is normal. Tricuspid regurgitation signal is inadequate for assessing  PA pressure.   3. Left atrial size was mildly dilated.   4. Right atrial size was mildly dilated.   5. The mitral valve is grossly normal. Trivial mitral valve  regurgitation.   6. The aortic valve is tricuspid. Aortic valve regurgitation is trivial.  Aortic regurgitation PHT measures 693 msec.   7. Aortic dilatation noted. There is mild dilatation of the ascending  aorta, measuring 40 mm.   8. The inferior vena cava is normal in size with greater than 50%  respiratory variability, suggesting right atrial pressure of 3 mmHg.   Comparison(s): Changes from prior study are noted. 01/14/2022: LVEF 55-60%,  severe basal septal hypertrophy, aorta 42 mm.    CORONARY STENT INTERVENTION  03/09/2022  LEFT HEART CATH AND CORONARY ANGIOGRAPHY    Conclusion       Dist LAD lesion is 50% stenosed.   Dist Cx lesion is 50% stenosed.   Mid RCA lesion is 50% stenosed.   2nd Diag lesion is 40% stenosed.   Mid LAD to Dist LAD lesion is 99% stenosed.   Non-stenotic Prox RCA lesion was previously treated.   A drug-eluting stent was successfully placed using a SYNERGY XD 3.0X16.   Post intervention, there is a 0% residual stenosis.     IMPRESSION: Successful mid LAD PCI drug-eluting stenting with a 3 mm x 16 mm long Synergy drug-eluting stent deployed at 3.2 mm.  The previously placed RCA stent was  widely patent and the lesion just beyond this was patent as well at 50 to 60% and smooth.  EF was normal by 2D echo.  Recommend DAPT uninterrupted for 12 months.  Stress cardiac resector modification including smoking cessation and avoidance of illicit drugs such as cocaine.  He left the lab in stable condition.   Recent Labs: 01/13/2022: ALT 13 03/10/2022: BUN 11; Creatinine, Ser 1.17;  Hemoglobin 15.0; Platelets 185; Potassium 4.0; Sodium 141  Recent Lipid Panel    Component Value Date/Time   CHOL 136 01/14/2022 0356   TRIG 70 01/14/2022 0356   HDL 35 (L) 01/14/2022 0356   CHOLHDL 3.9 01/14/2022 0356   VLDL 14 01/14/2022 0356   LDLCALC 87 01/14/2022 0356    History of Present Illness    57 year old male with the above past medical history including CAD s/p STEMI, DES-RCA in 01/2021 and DES-LAD in 02/2022, hypertension, hyperlipidemia, tobacco use, and polysubstance abuse.  Following his hospitalization in 01/2021 he moved to [place] and reported taking his medication intermittently including Brilinta only once a day.  He ultimately ran out of his medication.  He has not been seen in follow-up.  He presented to the ED on 03/07/2022 with intermittent dull chest pain, bilateral arm tingling similar to prior anginal equivalent.  UDS was positive for cocaine and marijuana.  Troponin peaked 619.  Cardiology was consulted in the setting of NSTEMI.  He underwent cardiac catheterization which revealed high-grade M LAD stenosis s/p DES, patent RCA stent, otherwise mild nonobstructive CAD.  Echocardiogram showed preserved LV function, severe LVH.  He was placed on aspirin and Brilinta, however, Brilinta was switched to Plavix due to concerns for non-adherence.    He presents today for follow-up.  Since his hospitalization he has been stable from a cardiac standpoint.  He denies any symptoms concerning for angina.  Unfortunately, he has not had insurance and notes that he ran out of his medications and has not taken any medication in the past week.  He states he now has insurance and is requesting refills of his current medications.  He denies any cocaine use.  He does continue to smoke.  Overall, he reports feeling well.  Home Medications    Current Outpatient Medications  Medication Sig Dispense Refill   aspirin EC 81 MG tablet Take 1 tablet (81 mg total) by mouth daily. Swallow  whole. 90 tablet 3   nicotine (NICODERM CQ - DOSED IN MG/24 HOURS) 14 mg/24hr patch Place 1 patch (14 mg total) onto the skin daily. 28 patch 0   amLODipine (NORVASC) 5 MG tablet Take 1 tablet (5 mg total) by mouth daily. 90 tablet 3   atorvastatin (LIPITOR) 80 MG tablet Take 1 tablet (80 mg total) by mouth daily. 90 tablet 3   carvedilol (COREG) 12.5 MG tablet Take 1 tablet (12.5 mg total) by mouth 2 (two) times daily. 180 tablet 3   clopidogrel (PLAVIX) 75 MG tablet Take 1 tablet (75 mg total) by mouth daily. 30 tablet 11   losartan (COZAAR) 50 MG tablet Take 1 tablet (50 mg total) by mouth daily. 90 tablet 3   nitroGLYCERIN (NITROSTAT) 0.4 MG SL tablet Place 1 tablet (0.4 mg total) under the tongue every 5 (five) minutes as needed for chest pain. 25 tablet 3   No current facility-administered medications for this visit.     Review of Systems    He denies chest pain, palpitations, dyspnea, pnd, orthopnea, n, v, dizziness, syncope, edema, weight gain, or early  satiety. All other systems reviewed and are otherwise negative except as noted above.   Physical Exam    VS:  BP (!) 156/88   Pulse 65   Ht 5\' 11"  (1.803 m)   Wt 173 lb 6.4 oz (78.7 kg)   BMI 24.18 kg/m  GEN: Well nourished, well developed, in no acute distress. HEENT: normal. Neck: Supple, no JVD, carotid bruits, or masses. Cardiac: RRR, no murmurs, rubs, or gallops. No clubbing, cyanosis, edema.  Radials/DP/PT 2+ and equal bilaterally.  Respiratory:  Respirations regular and unlabored, clear to auscultation bilaterally. GI: Soft, nontender, nondistended, BS + x 4. MS: no deformity or atrophy. Skin: warm and dry, no rash. Neuro:  Strength and sensation are intact. Psych: Normal affect.  Accessory Clinical Findings    ECG personally reviewed by me today - NSR, 65 bpm - no acute changes.   Lab Results  Component Value Date   WBC 6.3 03/10/2022   HGB 15.0 03/10/2022   HCT 46.5 03/10/2022   MCV 92.1 03/10/2022   PLT  185 03/10/2022   Lab Results  Component Value Date   CREATININE 1.17 03/10/2022   BUN 11 03/10/2022   NA 141 03/10/2022   K 4.0 03/10/2022   CL 112 (H) 03/10/2022   CO2 22 03/10/2022   Lab Results  Component Value Date   ALT 13 01/13/2022   AST 17 01/13/2022   ALKPHOS 63 01/13/2022   BILITOT 0.5 01/13/2022   Lab Results  Component Value Date   CHOL 136 01/14/2022   HDL 35 (L) 01/14/2022   LDLCALC 87 01/14/2022   TRIG 70 01/14/2022   CHOLHDL 3.9 01/14/2022    Lab Results  Component Value Date   HGBA1C 5.6 01/28/2021    Assessment & Plan    1. CAD:  S/p STEMI, DES-RCA in 01/2021 and DES-LAD in 02/2022. Stable with no anginal symptoms. He has not taken any of his medication in 1 week. States he now has insurance and should be able to afford his medication. Will resend all his prescriptions. Discussed the importance of medication adherence, follow-up. Discussed ED precautions. Continue aspirin, Plavix, amlodipine, carvedilol, losartan, and Lipitor.  2. Hypertension: BP elevated in office today. He has been off his meds for at least a week. Will resume current medications as above.  3. Hyperlipidemia: LDL was 87 in 12/2021. Will plan for repeat lipids, LFTs at next follow-up visit.   4. Tobacco use/Polysubstance abuse: He continues to smoke, denies any recent cocaine use. Full cessation advised.  5. Disposition: Follow-up in 6-8 weeks.   01/2022, NP 05/22/2022, 12:29 PM

## 2022-07-06 ENCOUNTER — Telehealth: Payer: Self-pay

## 2022-07-06 NOTE — Progress Notes (Signed)
Patient attempted to be outreached by Darrall Dears, PharmD Candidate on 07/06/2022 to discuss hypertension. Unable to leave a voicemail at this time.   Darrall Dears, PharmD Candidate   Joseph Art, Pharm.D. PGY-2 Ambulatory Care Pharmacy Resident

## 2022-07-15 ENCOUNTER — Ambulatory Visit: Payer: Commercial Managed Care - HMO | Attending: Nurse Practitioner | Admitting: Nurse Practitioner

## 2022-07-15 NOTE — Progress Notes (Deleted)
Office Visit    Patient Name: Lawrence Pope Date of Encounter: 07/15/2022  Primary Care Provider:  Angelique Blonder, DO Primary Cardiologist:  Glenetta Hew, MD  Chief Complaint    57 year old male with a history of CAD s/p STEMI, DES-RCA in 01/2021 and DES-LAD in 02/2022, hypertension, hyperlipidemia, tobacco use, and polysubstance use who presents for follow-up related to CAD.   Past Medical History    Past Medical History:  Diagnosis Date   Hx of migraines    Hypertension    Myocardial infarction [facility])    Past Surgical History:  Procedure Laterality Date   CORONARY STENT INTERVENTION N/A 03/09/2022   Procedure: CORONARY STENT INTERVENTION;  Surgeon: Lorretta Harp, MD;  Location: [place] [facility];  Service: Cardiovascular;  Laterality: N/A;   CORONARY/GRAFT ACUTE MI REVASCULARIZATION N/A 01/28/2021   Procedure: Coronary/Graft Acute MI Revascularization;  Surgeon: Leonie Man, MD;  Location: [place] [facility];  Service: Cardiovascular;  Laterality: N/A;   LEFT HEART CATH AND CORONARY ANGIOGRAPHY N/A 01/28/2021   Procedure: LEFT HEART CATH AND CORONARY ANGIOGRAPHY;  Surgeon: Leonie Man, MD;  Location: [place] [facility];  Service: Cardiovascular;  Laterality: N/A;   LEFT HEART CATH AND CORONARY ANGIOGRAPHY N/A 03/09/2022   Procedure: LEFT HEART CATH AND CORONARY ANGIOGRAPHY;  Surgeon: Lorretta Harp, MD;  Location: [place] [facility];  Service: Cardiovascular;  Laterality: N/A;    Allergies  No Known Allergies   Labs/Other Studies Reviewed    The following studies were reviewed today: Echo 03/08/2022 1. Left ventricular ejection fraction, by estimation, is 60 to 65%. Left  ventricular ejection fraction by 3D volume is 62 %. The left ventricle has  normal function. The left ventricle has no regional wall motion  abnormalities. There is severe asymmetric left ventricular hypertrophy of the basal-septal segment. Left ventricular diastolic parameters are  consistent with Grade I diastolic dysfunction (impaired relaxation).   2. Right ventricular systolic function is normal. The right ventricular  size is normal. Tricuspid regurgitation signal is inadequate for assessing  PA pressure.   3. Left atrial size was mildly dilated.   4. Right atrial size was mildly dilated.   5. The mitral valve is grossly normal. Trivial mitral valve  regurgitation.   6. The aortic valve is tricuspid. Aortic valve regurgitation is trivial.  Aortic regurgitation PHT measures 693 msec.   7. Aortic dilatation noted. There is mild dilatation of the ascending  aorta, measuring 40 mm.   8. The inferior vena cava is normal in size with greater than 50%  respiratory variability, suggesting right atrial pressure of 3 mmHg.   Comparison(s): Changes from prior study are noted. 01/14/2022: LVEF 55-60%,  severe basal septal hypertrophy, aorta 42 mm.    CORONARY STENT INTERVENTION  03/09/2022  LEFT HEART CATH AND CORONARY ANGIOGRAPHY    Conclusion       Dist LAD lesion is 50% stenosed.   Dist Cx lesion is 50% stenosed.   Mid RCA lesion is 50% stenosed.   2nd Diag lesion is 40% stenosed.   Mid LAD to Dist LAD lesion is 99% stenosed.   Non-stenotic Prox RCA lesion was previously treated.   A drug-eluting stent was successfully placed using a SYNERGY XD 3.0X16.   Post intervention, there is a 0% residual stenosis.     IMPRESSION: Successful mid LAD PCI drug-eluting stenting with a 3 mm x 16 mm long Synergy drug-eluting stent deployed at 3.2 mm.  The previously placed RCA stent  was widely patent and the lesion just beyond this was patent as well at 50 to 60% and smooth.  EF was normal by 2D echo.  Recommend DAPT uninterrupted for 12 months.  Stress cardiac resector modification including smoking cessation and avoidance of illicit drugs such as cocaine.  He left the lab in stable condition.     Recent Labs: 01/13/2022: ALT 13 03/10/2022: BUN 11; Creatinine, Ser 1.17;  Hemoglobin 15.0; Platelets 185; Potassium 4.0; Sodium 141  Recent Lipid Panel    Component Value Date/Time   CHOL 136 01/14/2022 0356   TRIG 70 01/14/2022 0356   HDL 35 (L) 01/14/2022 0356   CHOLHDL 3.9 01/14/2022 0356   VLDL 14 01/14/2022 0356   LDLCALC 87 01/14/2022 0356    History of Present Illness    57 year old male with the above past medical history including CAD s/p STEMI, DES-RCA in 01/2021 and DES-LAD in 02/2022, hypertension, hyperlipidemia, tobacco use, and polysubstance abuse.   Following his hospitalization in 01/2021 he moved to [place] and reported taking his medication intermittently including Brilinta only once a day.  He ultimately ran out of his medication.  He has not been seen in follow-up.  He presented to the ED on 03/07/2022 with intermittent dull chest pain, bilateral arm tingling similar to prior anginal equivalent.  UDS was positive for cocaine and marijuana.  Troponin peaked 619.  Cardiology was consulted in the setting of NSTEMI.  He underwent cardiac catheterization which revealed high-grade M LAD stenosis s/p DES, patent RCA stent, otherwise mild nonobstructive CAD.  Echocardiogram showed preserved LV function, severe LVH.  He was placed on aspirin and Brilinta, however, Brilinta was switched to Plavix due to concerns for non-adherence.  He was last seen in the office on 05/22/2022 and was stable from a cardiac standpoint.,  He did report having run out of his medications at the time and not having taken any medications for 1 week prior to his visit.  His BP was elevated.  He denies symptoms concerning for angina.   He presents today for follow-up.  Since his last visit he has been   1. CAD:  S/p STEMI, DES-RCA in 01/2021 and DES-LAD in 02/2022. Stable with no anginal symptoms. He has not taken any of his medication in 1 week. States he now has insurance and should be able to afford his medication. Will resend all his prescriptions. Discussed the importance of  medication adherence, follow-up. Discussed ED precautions. Continue aspirin, Plavix, amlodipine, carvedilol, losartan, and Lipitor.   2. Hypertension: BP elevated in office today. He has been off his meds for at least a week. Will resume current medications as above.   3. Hyperlipidemia: LDL was 87 in 12/2021. Will plan for repeat lipids, LFTs at next follow-up visit.    4. Tobacco use/Polysubstance abuse: He continues to smoke, denies any recent cocaine use. Full cessation advised.   5. Disposition: Follow-up in Home Medications    Current Outpatient Medications  Medication Sig Dispense Refill   amLODipine (NORVASC) 5 MG tablet Take 1 tablet (5 mg total) by mouth daily. 90 tablet 3   aspirin EC 81 MG tablet Take 1 tablet (81 mg total) by mouth daily. Swallow whole. 90 tablet 3   atorvastatin (LIPITOR) 80 MG tablet Take 1 tablet (80 mg total) by mouth daily. 90 tablet 3   carvedilol (COREG) 12.5 MG tablet Take 1 tablet (12.5 mg total) by mouth 2 (two) times daily. 180 tablet 3   clopidogrel (PLAVIX) 75 MG tablet  Take 1 tablet (75 mg total) by mouth daily. 30 tablet 11   losartan (COZAAR) 50 MG tablet Take 1 tablet (50 mg total) by mouth daily. 90 tablet 3   nicotine (NICODERM CQ - DOSED IN MG/24 HOURS) 14 mg/24hr patch Place 1 patch (14 mg total) onto the skin daily. 28 patch 0   nitroGLYCERIN (NITROSTAT) 0.4 MG SL tablet Place 1 tablet (0.4 mg total) under the tongue every 5 (five) minutes as needed for chest pain. 25 tablet 3   No current facility-administered medications for this visit.     Review of Systems    ***.  All other systems reviewed and are otherwise negative except as noted above.    Physical Exam    VS:  There were no vitals taken for this visit. , BMI There is no height or weight on file to calculate BMI.     GEN: Well nourished, well developed, in no acute distress. HEENT: normal. Neck: Supple, no JVD, carotid bruits, or masses. Cardiac: RRR, no murmurs, rubs, or  gallops. No clubbing, cyanosis, edema.  Radials/DP/PT 2+ and equal bilaterally.  Respiratory:  Respirations regular and unlabored, clear to auscultation bilaterally. GI: Soft, nontender, nondistended, BS + x 4. MS: no deformity or atrophy. Skin: warm and dry, no rash. Neuro:  Strength and sensation are intact. Psych: Normal affect.  Accessory Clinical Findings    ECG personally reviewed by me today - *** - no acute changes.   Lab Results  Component Value Date   WBC 6.3 03/10/2022   HGB 15.0 03/10/2022   HCT 46.5 03/10/2022   MCV 92.1 03/10/2022   PLT 185 03/10/2022   Lab Results  Component Value Date   CREATININE 1.17 03/10/2022   BUN 11 03/10/2022   NA 141 03/10/2022   K 4.0 03/10/2022   CL 112 (H) 03/10/2022   CO2 22 03/10/2022   Lab Results  Component Value Date   ALT 13 01/13/2022   AST 17 01/13/2022   ALKPHOS 63 01/13/2022   BILITOT 0.5 01/13/2022   Lab Results  Component Value Date   CHOL 136 01/14/2022   HDL 35 (L) 01/14/2022   LDLCALC 87 01/14/2022   TRIG 70 01/14/2022   CHOLHDL 3.9 01/14/2022    Lab Results  Component Value Date   HGBA1C 5.6 01/28/2021    Assessment & Plan    1.  ***  No BP recorded.  {Refresh Note OR Click here to enter BP  :1}***   Lenna Sciara, NP 07/15/2022, 5:38 AM

## 2022-08-13 ENCOUNTER — Encounter: Payer: Self-pay | Admitting: *Deleted

## 2023-01-09 ENCOUNTER — Other Ambulatory Visit: Payer: Self-pay

## 2023-01-09 ENCOUNTER — Emergency Department (HOSPITAL_COMMUNITY): Payer: MEDICAID

## 2023-01-09 ENCOUNTER — Emergency Department (HOSPITAL_COMMUNITY)
Admission: EM | Admit: 2023-01-09 | Discharge: 2023-01-10 | Disposition: A | Discharge status: Home / Self Care | Payer: MEDICAID | Attending: Emergency Medicine | Admitting: Emergency Medicine

## 2023-01-09 DIAGNOSIS — M25561 Pain in right knee: Secondary | ICD-10-CM | POA: Diagnosis not present

## 2023-01-09 DIAGNOSIS — R0602 Shortness of breath: Secondary | ICD-10-CM | POA: Insufficient documentation

## 2023-01-09 DIAGNOSIS — R079 Chest pain, unspecified: Secondary | ICD-10-CM | POA: Diagnosis present

## 2023-01-09 LAB — BASIC METABOLIC PANEL
Anion gap: 12 (ref 5–15)
BUN: 12 mg/dL (ref 6–20)
CO2: 26 mmol/L (ref 22–32)
Calcium: 9.5 mg/dL (ref 8.9–10.3)
Chloride: 105 mmol/L (ref 98–111)
Creatinine, Ser: 1.02 mg/dL (ref 0.61–1.24)
GFR, Estimated: >60 mL/min (ref >60)
Glucose, Bld: 124 mg/dL — ABNORMAL HIGH (ref 70–99)
Potassium: 3.6 mmol/L (ref 3.5–5.1)
Sodium: 143 mmol/L (ref 135–145)

## 2023-01-09 LAB — CBC
HCT: 45.4 % (ref 39.0–52.0)
Hemoglobin: 14.8 g/dL (ref 13.0–17.0)
MCH: 30.1 pg (ref 26.0–34.0)
MCHC: 32.6 g/dL (ref 30.0–36.0)
MCV: 92.5 fL (ref 80.0–100.0)
Platelets: 216 10*3/uL (ref 150–400)
RBC: 4.91 MIL/uL (ref 4.22–5.81)
RDW: 13.9 % (ref 11.5–15.5)
WBC: 5.6 10*3/uL (ref 4.0–10.5)
nRBC: 0 % (ref 0.0–0.2)

## 2023-01-09 LAB — TROPONIN I (HIGH SENSITIVITY)
Troponin I (High Sensitivity): 14 ng/L (ref <18)
Troponin I (High Sensitivity): 13 ng/L (ref <18)

## 2023-01-09 MED ORDER — ASPIRIN 81 MG PO CHEW
324.0000 mg | CHEWABLE_TABLET | Freq: Once | ORAL | Status: AC
  Administered 2023-01-09: 324 mg via ORAL
  Filled 2023-01-09: qty 4

## 2023-01-09 NOTE — ED Triage Notes (Signed)
PT arrives via POV. Pt reports chest pain x 3weeks. Reports it has progressively gotten worse. C/O associated sob. Pt is AxOx4. Reports cocaine use this morning.

## 2023-01-09 NOTE — ED Provider Notes (Signed)
[facility] Emergency Department Provider Note MRN:  [id number]  Arrival date & time: 01/10/23     Chief Complaint   Chest Pain   History of Present Illness   Lawrence Pope is a 57 y.o. year-old male presents to the ED with chief complaint of chest pain for the past couple of days.  Triage note says 3 weeks, but patient tells me for the past couple of days.  He states that his symptoms have been worsening.  He reports some associated SOB and cough.  He states that he used cocaine this morning and states that he wants to go into rehab.   He also states that his right knee has been painful and swollen.  He denies any known injury.  He has been using a compression sleeve with some relief.  History provided by patient.   Review of Systems  Pertinent positive and negative review of systems noted in HPI.    Physical Exam   Vitals:   01/09/23 2246 01/10/23 0024  BP: (!) 170/88 (!) 166/79  Pulse: 68 70  Resp: 20 17  Temp: 97.9 F (36.6 C)   SpO2: 100% 99%    CONSTITUTIONAL:  non toxic-appearing, NAD NEURO:  Alert and oriented x 3, CN 3-12 grossly intact EYES:  eyes equal and reactive ENT/NECK:  Supple, no stridor  CARDIO:  normal rate, regular rhythm, appears well-perfused  PULM:  No respiratory distress, diminished lung sounds bilaterally GI/GU:  non-distended,  MSK/SPINE:  mild swelling to medial right knee, no erythema, ROM with some limitation SKIN:  no rash, atraumatic   *Additional and/or pertinent findings included in MDM below  Diagnostic and Interventional Summary    EKG Interpretation Date/Time:    Ventricular Rate:    PR Interval:    QRS Duration:    QT Interval:    QTC Calculation:   R Axis:      Text Interpretation:         Labs Reviewed  BASIC METABOLIC PANEL - Abnormal; Notable for the following components:      Result Value   Glucose, Bld 124 (*)    All other components within normal limits  CBC  TROPONIN I (HIGH  SENSITIVITY)  TROPONIN I (HIGH SENSITIVITY)    DG Knee Complete 4 Views Right  Final Result    DG Chest 2 View  Final Result      Medications  aspirin chewable tablet 324 mg (324 mg Oral Given 01/09/23 1906)     Procedures  /  Critical Care Procedures  ED Course and Medical Decision Making  I have reviewed the triage vital signs, the nursing notes, and pertinent available records from the EMR.  Social Determinants Affecting Complexity of Care: Patient has no clinically significant social determinants affecting this chief complaint..   ED Course:    Medical Decision Making Patient here with chest pain for the past few days with associated SOB.  He has hx of prior MI, however troponins are reassuring.  He states that he isn't having any chest pain now.  He does have a coarse cough on my exam and has diminished lung sounds with findings consistent with COPD on CXR.  Will give him an inhaler for home use.  I don't think that his symptoms for the past several days are ACS.  We did also discuss his cocaine use and I've encouraged him to quit using.  He states that right now he primarily just wants something to eat and also  wants to have his right knee evaluated because it has been swollen and painful, though he can't recall a specific time frame or injury.  He doesn't have any calf tenderness.  He isn't hypoxic or tachycardic.  I don't think that he has a PE or DVT.  His right knee is likely soft tissue injury.  I have a low suspicion for septic joint.  The knee is not red or hot.    Patient's labs are reassuring to this point.  I'll add on a right knee x-ray, but anticipate discharge with a knee immobilizer and ortho follow-up.  Amount and/or Complexity of Data Reviewed Radiology: ordered.         Consultants: No consultations were needed in caring for this patient.   Treatment and Plan: I considered admission due to patient's initial presentation, but after considering  the examination and diagnostic results, patient will not require admission and can be discharged with outpatient follow-up.    Final Clinical Impressions(s) / ED Diagnoses     ICD-10-CM   1. Nonspecific chest pain  R07.9     2. Acute pain of right knee  M25.561       ED Discharge Orders     None         Discharge Instructions Discussed with and Provided to Patient:   Discharge Instructions   None      Roxy Horseman, PA-C 01/10/23 1610    Gwyneth Sprout, MD 01/10/23 1800

## 2023-01-10 NOTE — Progress Notes (Signed)
Orthopedic Tech Progress Note Patient Details:  Lawrence Pope 1965/12/07 [id number]  Ortho Devices Type of Ortho Device: Knee Immobilizer Ortho Device/Splint Location: rle Ortho Device/Splint Interventions: Ordered, Application, Adjustment   Post Interventions Patient Tolerated: Well Instructions Provided: Care of device, Adjustment of device  Trinna Post 01/10/2023, 12:12 AM

## 2023-07-20 ENCOUNTER — Emergency Department (HOSPITAL_COMMUNITY)
Admission: EM | Admit: 2023-07-20 | Discharge: 2023-07-20 | Disposition: A | Discharge status: Home / Self Care | Payer: MEDICAID | Attending: Emergency Medicine | Admitting: Emergency Medicine

## 2023-07-20 ENCOUNTER — Emergency Department (HOSPITAL_COMMUNITY): Payer: MEDICAID

## 2023-07-20 ENCOUNTER — Encounter (HOSPITAL_COMMUNITY): Payer: Self-pay

## 2023-07-20 ENCOUNTER — Other Ambulatory Visit: Payer: Self-pay

## 2023-07-20 DIAGNOSIS — Z720 Tobacco use: Secondary | ICD-10-CM | POA: Insufficient documentation

## 2023-07-20 DIAGNOSIS — R1084 Generalized abdominal pain: Secondary | ICD-10-CM | POA: Diagnosis present

## 2023-07-20 DIAGNOSIS — I2510 Atherosclerotic heart disease of native coronary artery without angina pectoris: Secondary | ICD-10-CM

## 2023-07-20 DIAGNOSIS — R531 Weakness: Secondary | ICD-10-CM | POA: Insufficient documentation

## 2023-07-20 DIAGNOSIS — I10 Essential (primary) hypertension: Secondary | ICD-10-CM

## 2023-07-20 DIAGNOSIS — R630 Anorexia: Secondary | ICD-10-CM | POA: Insufficient documentation

## 2023-07-20 DIAGNOSIS — E785 Hyperlipidemia, unspecified: Secondary | ICD-10-CM

## 2023-07-20 DIAGNOSIS — R062 Wheezing: Secondary | ICD-10-CM | POA: Diagnosis not present

## 2023-07-20 DIAGNOSIS — R197 Diarrhea, unspecified: Secondary | ICD-10-CM

## 2023-07-20 DIAGNOSIS — E860 Dehydration: Secondary | ICD-10-CM

## 2023-07-20 LAB — CBC WITH DIFFERENTIAL/PLATELET
Abs Immature Granulocytes: 0.02 10*3/uL (ref 0.00–0.07)
Basophils Absolute: 0 10*3/uL (ref 0.0–0.1)
Basophils Relative: 0 %
Eosinophils Absolute: 0 10*3/uL (ref 0.0–0.5)
Eosinophils Relative: 0 %
HCT: 44.8 % (ref 39.0–52.0)
Hemoglobin: 14.8 g/dL (ref 13.0–17.0)
Immature Granulocytes: 0 %
Lymphocytes Relative: 19 %
Lymphs Abs: 1.4 10*3/uL (ref 0.7–4.0)
MCH: 29.8 pg (ref 26.0–34.0)
MCHC: 33 g/dL (ref 30.0–36.0)
MCV: 90.3 fL (ref 80.0–100.0)
Monocytes Absolute: 0.6 10*3/uL (ref 0.1–1.0)
Monocytes Relative: 8 %
Neutro Abs: 5.3 10*3/uL (ref 1.7–7.7)
Neutrophils Relative %: 73 %
Platelets: 172 10*3/uL (ref 150–400)
RBC: 4.96 MIL/uL (ref 4.22–5.81)
RDW: 14.2 % (ref 11.5–15.5)
WBC: 7.3 10*3/uL (ref 4.0–10.5)
nRBC: 0 % (ref 0.0–0.2)

## 2023-07-20 LAB — COMPREHENSIVE METABOLIC PANEL
ALT: 12 U/L (ref 0–44)
AST: 18 U/L (ref 15–41)
Albumin: 2.5 g/dL — ABNORMAL LOW (ref 3.5–5.0)
Alkaline Phosphatase: 43 U/L (ref 38–126)
Anion gap: 6 (ref 5–15)
BUN: 10 mg/dL (ref 6–20)
CO2: 25 mmol/L (ref 22–32)
Calcium: 8.4 mg/dL — ABNORMAL LOW (ref 8.9–10.3)
Chloride: 106 mmol/L (ref 98–111)
Creatinine, Ser: 0.96 mg/dL (ref 0.61–1.24)
GFR, Estimated: >60 mL/min (ref >60)
Glucose, Bld: 125 mg/dL — ABNORMAL HIGH (ref 70–99)
Potassium: 3.6 mmol/L (ref 3.5–5.1)
Sodium: 137 mmol/L (ref 135–145)
Total Bilirubin: 0.4 mg/dL (ref 0.0–1.2)
Total Protein: 5.7 g/dL — ABNORMAL LOW (ref 6.5–8.1)

## 2023-07-20 LAB — RESP PANEL BY RT-PCR (RSV, FLU A&B, COVID)  RVPGX2
Influenza A by PCR: NEGATIVE
Influenza B by PCR: NEGATIVE
Resp Syncytial Virus by PCR: NEGATIVE
SARS Coronavirus 2 by RT PCR: NEGATIVE

## 2023-07-20 MED ORDER — ALBUTEROL SULFATE HFA 108 (90 BASE) MCG/ACT IN AERS
2.0000 | INHALATION_SPRAY | RESPIRATORY_TRACT | Status: DC | PRN
  Administered 2023-07-20: 2 via RESPIRATORY_TRACT
  Filled 2023-07-20: qty 6.7

## 2023-07-20 MED ORDER — AMLODIPINE BESYLATE 5 MG PO TABS: 5.0000 mg | ORAL_TABLET | Freq: Every day | ORAL | 3 refills | Status: AC

## 2023-07-20 MED ORDER — LACTATED RINGERS IV BOLUS
1000.0000 mL | Freq: Once | INTRAVENOUS | Status: AC
  Administered 2023-07-20: 1000 mL via INTRAVENOUS

## 2023-07-20 MED ORDER — CARVEDILOL 12.5 MG PO TABS: 12.5000 mg | ORAL_TABLET | Freq: Two times a day (BID) | ORAL | 3 refills | Status: AC

## 2023-07-20 MED ORDER — ATORVASTATIN CALCIUM 80 MG PO TABS: 80.0000 mg | ORAL_TABLET | Freq: Every day | ORAL | 3 refills | Status: AC

## 2023-07-20 NOTE — Discharge Instructions (Addendum)
 You can start on a baby aspirin as well.  All your labs today were normal.  You will need to follow up with a regular doctor and then also GI for a colonoscopy.  Heart, kidney's and liver were all normal.  You can get imodium for the diarrhea.  If bleeding gets heavier, you are passing out or you develop severe abd pain return to the ER.

## 2023-07-20 NOTE — ED Provider Notes (Signed)
 [place] EMERGENCY DEPARTMENT AT [facility] Provider Note   CSN: [id number] Arrival date & time: 07/20/23  1191     History  Chief Complaint  Patient presents with   Chest Pain   Blood In Stools   Abdominal Pain    Lawrence Pope is a 58 y.o. male.  Pt is a 57y/o male with hx of CAD s/p STEMI, DES-RCA in 01/2021 and DES-LAD in 02/2022, hypertension, hyperlipidemia, tobacco use, and polysubstance use who has been off all his meds for the last 1 month as he was waiting to get insurance and does not currently have a PCP presenting today with c/o of severe diarrhea for the last 4 days  (>10episodes/day) that is watery and has blood mixed with the stool, abd pain but denies N/V.  He denies any rectal pain with bowel movements.  Diffuse abd pain.  He states his girlfriend has the same thing but she is getting better and his is not improving.  Unknown bad food exposure.  No recent abx.  He also states for the last 2-3 days he has had a cough productive of mucous, chills/sweats, some wheezing and occasional SOB.  No swelling in his legs or leg pain.  Poor appetite and feeling general malaise and weakness.  Patient is not on any anticoagulation at this time.  The history is provided by the patient.  Chest Pain Associated symptoms: abdominal pain   Abdominal Pain Associated symptoms: chest pain        Home Medications Prior to Admission medications   Medication Sig Start Date End Date Taking? Authorizing Provider  amLODipine (NORVASC) 5 MG tablet Take 1 tablet (5 mg total) by mouth daily. Patient not taking: Reported on 07/20/2023 05/22/22   Joylene Grapes, NP  atorvastatin (LIPITOR) 80 MG tablet Take 1 tablet (80 mg total) by mouth daily. Patient not taking: Reported on 07/20/2023 05/22/22   Joylene Grapes, NP  carvedilol (COREG) 12.5 MG tablet Take 1 tablet (12.5 mg total) by mouth 2 (two) times daily. Patient not taking: Reported on 07/20/2023 05/22/22   Joylene Grapes, NP   clopidogrel (PLAVIX) 75 MG tablet Take 1 tablet (75 mg total) by mouth daily. Patient not taking: Reported on 07/20/2023 05/22/22   Joylene Grapes, NP  losartan (COZAAR) 50 MG tablet Take 1 tablet (50 mg total) by mouth daily. Patient not taking: Reported on 07/20/2023 05/22/22   Joylene Grapes, NP  nicotine (NICODERM CQ - DOSED IN MG/24 HOURS) 14 mg/24hr patch Place 1 patch (14 mg total) onto the skin daily. Patient not taking: Reported on 07/20/2023 03/10/22   Manson Passey, PA  nitroGLYCERIN (NITROSTAT) 0.4 MG SL tablet Place 1 tablet (0.4 mg total) under the tongue every 5 (five) minutes as needed for chest pain. Patient not taking: Reported on 07/20/2023 05/22/22   Joylene Grapes, NP      Allergies    Patient has no known allergies.    Review of Systems   Review of Systems  Cardiovascular:  Positive for chest pain.  Gastrointestinal:  Positive for abdominal pain.    Physical Exam Updated Vital Signs BP (!) 149/77   Pulse 65   Temp 98 F (36.7 C) (Oral)   Resp 20   Ht 5\' 11"  (1.803 m)   Wt 77.1 kg   SpO2 99%   BMI 23.71 kg/m  Physical Exam Vitals and nursing note reviewed.  Constitutional:      General: He is not in acute  distress.    Appearance: He is well-developed.  HENT:     Head: Normocephalic and atraumatic.  Eyes:     Conjunctiva/sclera: Conjunctivae normal.     Pupils: Pupils are equal, round, and reactive to light.  Cardiovascular:     Rate and Rhythm: Normal rate and regular rhythm.     Pulses: Normal pulses.     Heart sounds: No murmur heard. Pulmonary:     Effort: Pulmonary effort is normal. No respiratory distress.     Breath sounds: Wheezing present. No rales.     Comments: Occasional expiratory wheeze with harsh cough Abdominal:     General: There is no distension.     Palpations: Abdomen is soft.     Tenderness: There is generalized abdominal tenderness. There is no right CVA tenderness, left CVA tenderness, guarding or rebound.   Genitourinary:    Comments: Rectum without any fissures, obvious blood present or hemorrhoids Musculoskeletal:        General: No tenderness. Normal range of motion.     Cervical back: Normal range of motion and neck supple.  Skin:    General: Skin is warm and dry.     Findings: No erythema or rash.  Neurological:     Mental Status: He is alert and oriented to person, place, and time. Mental status is at baseline.  Psychiatric:        Behavior: Behavior normal.     ED Results / Procedures / Treatments   Labs (all labs ordered are listed, but only abnormal results are displayed) Labs Reviewed  COMPREHENSIVE METABOLIC PANEL - Abnormal; Notable for the following components:      Result Value   Glucose, Bld 125 (*)    Calcium 8.4 (*)    Total Protein 5.7 (*)    Albumin 2.5 (*)    All other components within normal limits  RESP PANEL BY RT-PCR (RSV, FLU A&B, COVID)  [id number]  CBC WITH DIFFERENTIAL/PLATELET  URINALYSIS, W/ REFLEX TO CULTURE (INFECTION SUSPECTED)    EKG EKG Interpretation Date/Time:  Tuesday July 20 2023 08:56:10 EDT Ventricular Rate:  74 PR Interval:  168 QRS Duration:  97 QT Interval:  408 QTC Calculation: 453 R Axis:   82  Text Interpretation: Sinus rhythm Left ventricular hypertrophy Anterior infarct, old No significant change since last tracing Confirmed by Gwyneth Sprout ([id number]) on 07/20/2023 9:23:46 AM  Radiology DG Chest Port 1 View Result Date: 07/20/2023 CLINICAL DATA:  Cough. EXAM: PORTABLE CHEST 1 VIEW COMPARISON:  01/09/2023. FINDINGS: Bilateral lung fields are clear. Bilateral costophrenic angles are clear. Normal cardio-mediastinal silhouette. No acute osseous abnormalities. The soft tissues are within normal limits. IMPRESSION: No active disease. Electronically Signed   By: Jules Schick M.D.   On: 07/20/2023 11:37    Procedures Procedures    Medications Ordered in ED Medications  albuterol (VENTOLIN HFA) 108 (90 Base) MCG/ACT  inhaler 2 puff (2 puffs Inhalation Given 07/20/23 1208)  lactated ringers bolus 1,000 mL (0 mLs Intravenous Stopped 07/20/23 1045)    ED Course/ Medical Decision Making/ A&P                                 Medical Decision Making Amount and/or Complexity of Data Reviewed Labs: ordered. Radiology: ordered.  Risk Prescription drug management.   Pt with multiple medical problems and comorbidities and presenting today with a complaint that caries a high risk for morbidity and mortality.  Here today with above symptoms.  Suspect infectious etiology viral versus foodborne illness.  Patient is complaining of blood in the stool which could be an infectious colitis.  Lower suspicion for C. difficile as patient does not have specific risk factors.  Low suspicion for ACS, PE today.  Concern for possible dehydration and electrolyte abnormalities.  Patient reports he has had blood in his stool in the past but it has been several months ago.  Also possibility for IBD.  Vital signs are reassuring but patient does report he has been off of meds for about a month due to not having insurance but reports now he has insurance and can now get a doctor. He was given albuterol HFA, fluids.  I independently interpreted patient's EKG and labs.  EKG without acute findings today, CBC, CMP, viral swab are all negative.  I have independently visualized and interpreted pt's images today.  Chest x-ray without acute findings today.  On repeat evaluation after meds patient reports that he is hungry and feel much much better.  Will p.o. challenge.  He is not having any localized abdominal pain at this time. Patient was able to eat here without any issues.  He did report 1 episode of diarrhea here but no blood in his stool.  At this time feel that patient is stable for discharge and will need outpatient PCP follow-up as well as GI follow-up for colonoscopy which he has never had.  Did not feel that he needs any further testing today.   He is comfortable with this plan.  TOC was consulted to help patient find a PCP.          Final Clinical Impression(s) / ED Diagnoses Final diagnoses:  None    Rx / DC Orders ED Discharge Orders     None         Gwyneth Sprout, MD 07/20/23 1624

## 2023-07-20 NOTE — ED Triage Notes (Signed)
 Pt c.o intermittent chest pain for a week, cough and flu like s/s, states his friend has the flu. Pt also c.o dark bloody diarrhea for the past 4 days with abd pain, no hx of same.

## 2023-07-20 NOTE — ED Notes (Signed)
Pt ambulated to the restroom without assistance

## 2023-07-20 NOTE — Progress Notes (Signed)
 CSW received consult for patient for PCP needs.  Per treatment team, patient's PCP is Augustine Radar, FNP at [facility]. CSW attempted to reach staff at office without success - a voicemail was left requesting a return call.  Edwin Dada, MSW, LCSW Transitions of Care  Clinical Social Worker II [phone]

## 2023-07-20 NOTE — ED Notes (Signed)
 X-ray at bedside

## 2023-11-07 IMAGING — CT CT ANGIO CHEST
2 of 7 series · 18 of 46 positions shown · IV contrast (agent unspecified)
Comparison: Radiograph earlier today.

CLINICAL DATA: Pulmonary embolism (PE) suspected, positive D-dimer

Central chest pain with shortness of breath.  Dizziness.
EXAM:
CT ANGIOGRAPHY CHEST WITH CONTRAST
TECHNIQUE: Multidetector CT imaging of the chest was performed using the
standard protocol during bolus administration of intravenous
contrast. Multiplanar CT image reconstructions and MIPs were
obtained to evaluate the vascular anatomy.

[Series 6: thins · axial · 0.82mm/px · z∈[+1026,+1286]mm · 15 of 292 slices shown]
[im 16/292  lung]
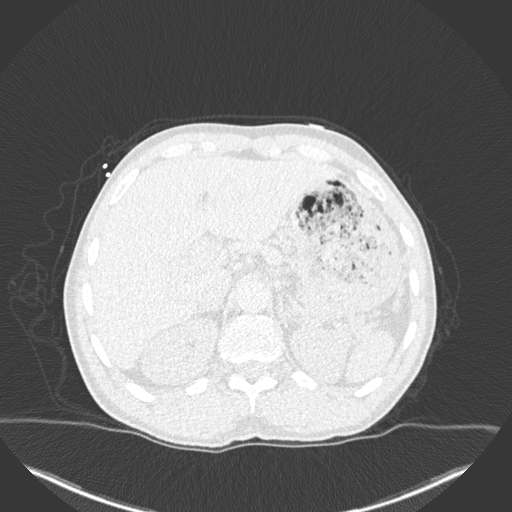
[im 31/292  soft-tissue]
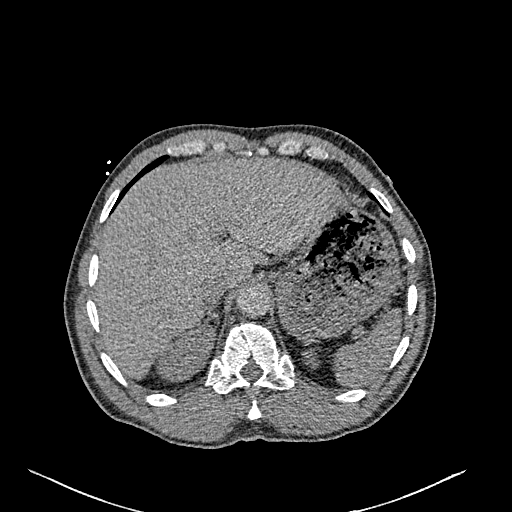
[im 62/292  lung]
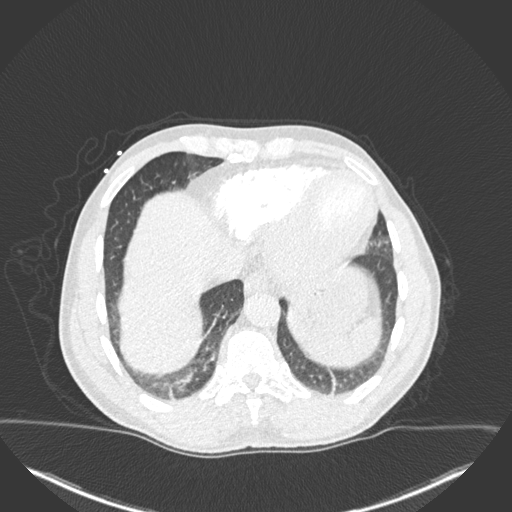
[im 77/292  soft-tissue]
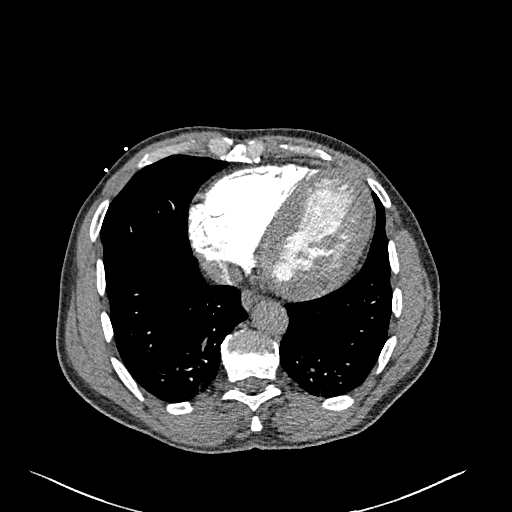
[im 92/292  lung]
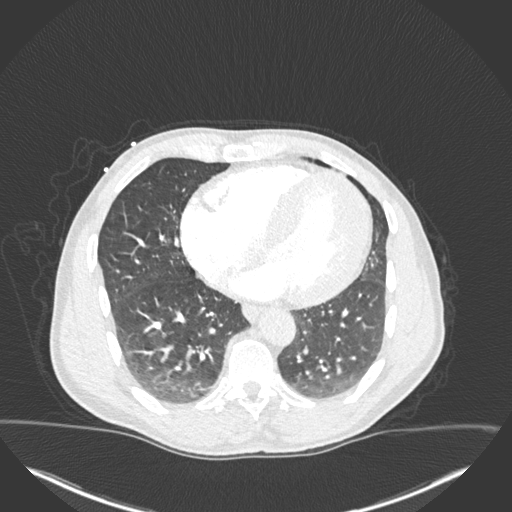
[im 108/292  soft-tissue]
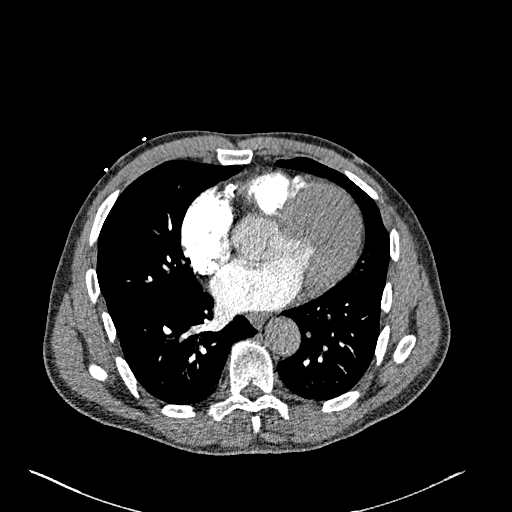
[im 123/292  lung]
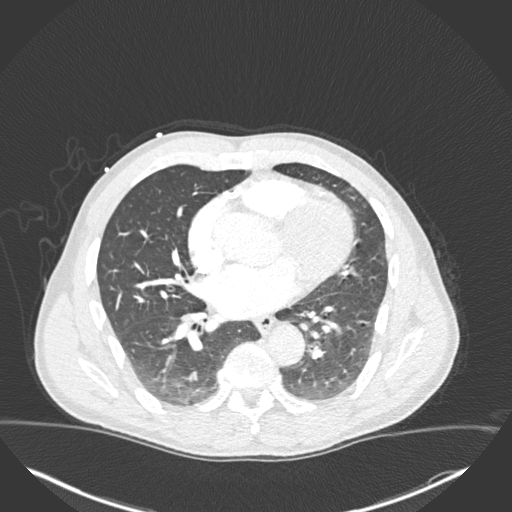
[im 154/292  soft-tissue]
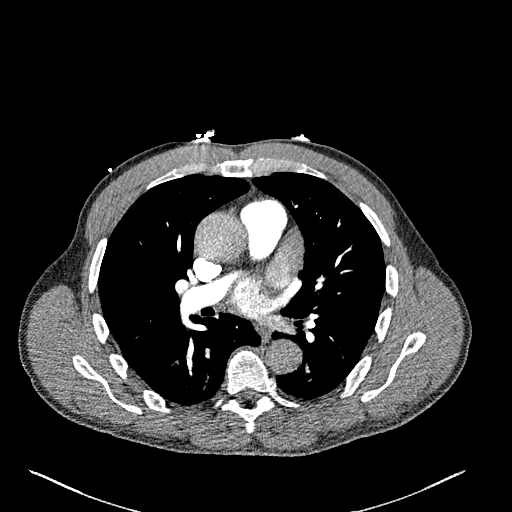
[im 169/292  lung]
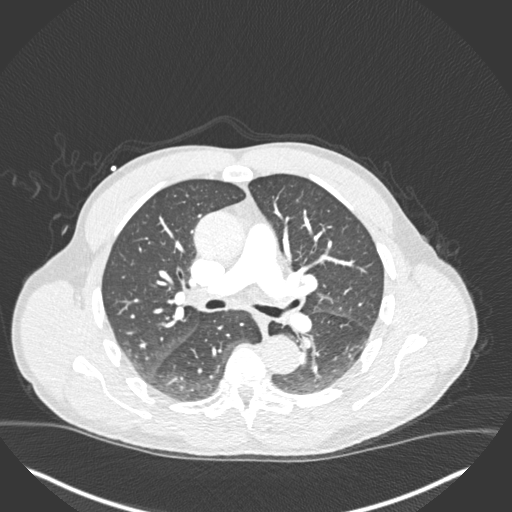
[im 184/292  soft-tissue]
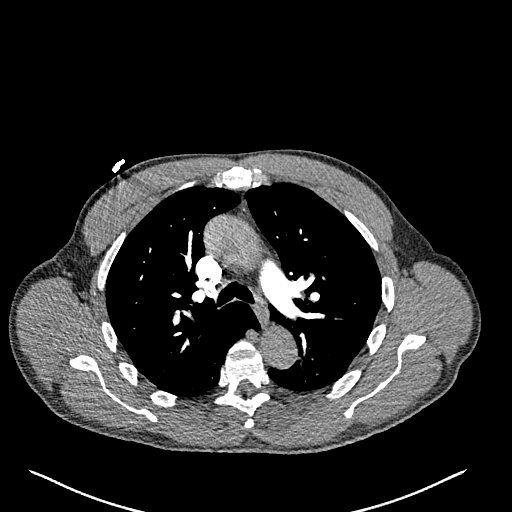
[im 200/292  lung]
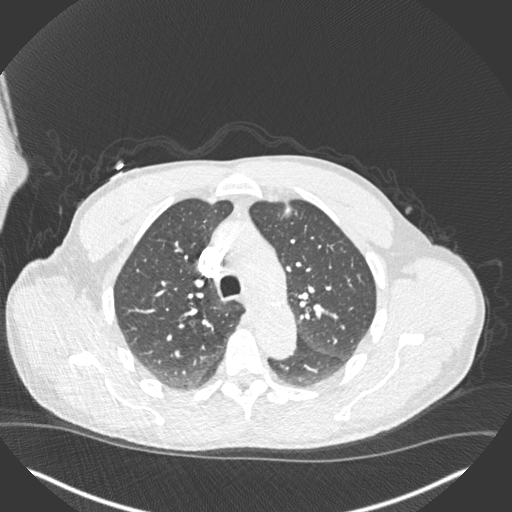
[im 215/292  soft-tissue]
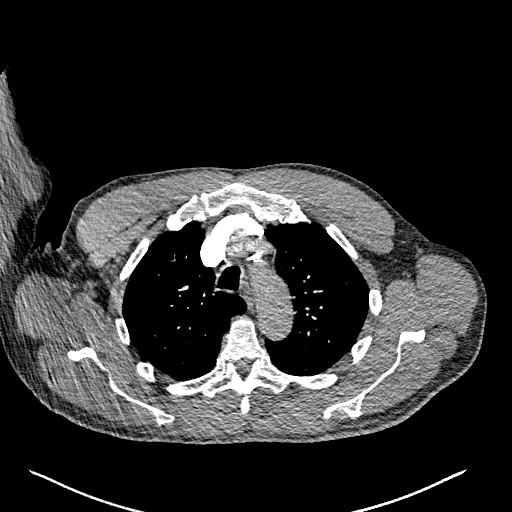
[im 246/292  lung]
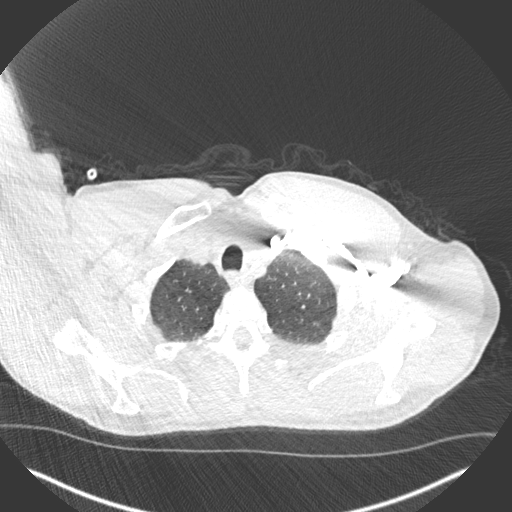
[im 261/292  soft-tissue]
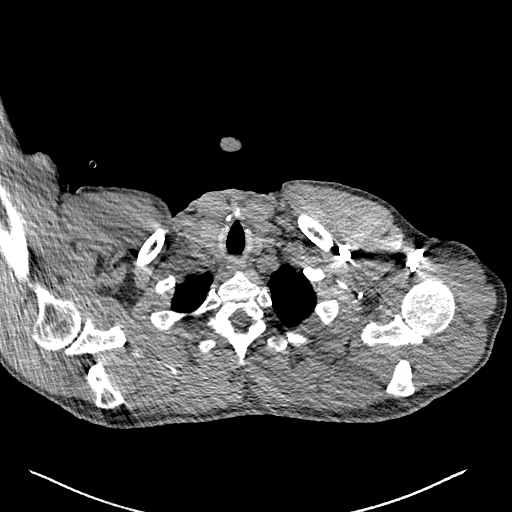
[im 276/292  lung]
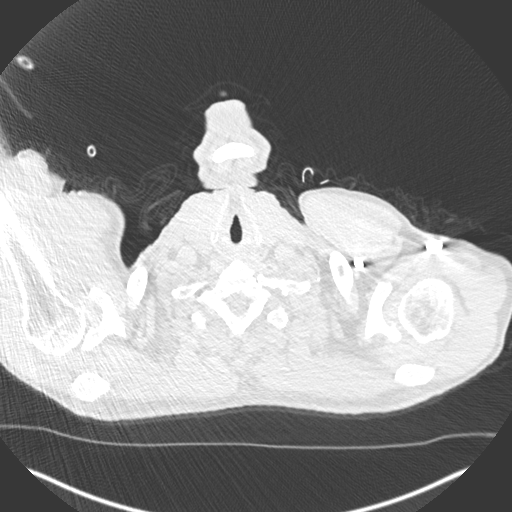

[Series 8: coronal mpr · coronal · 0.62mm/px · 3 of 135 slices shown]
[im 34/135  soft-tissue]
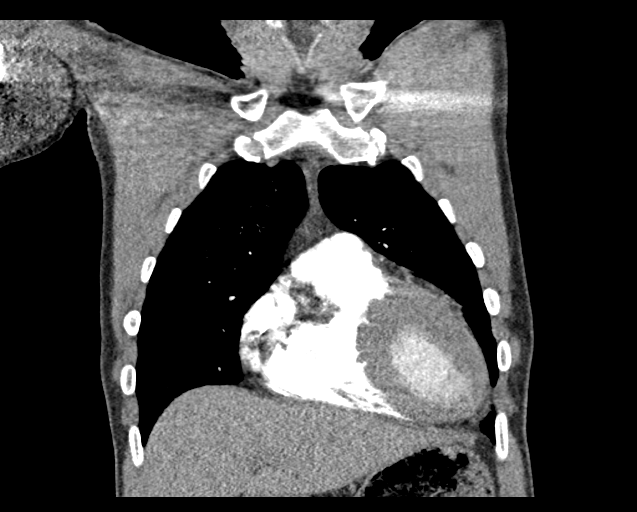
[im 68/135  soft-tissue]
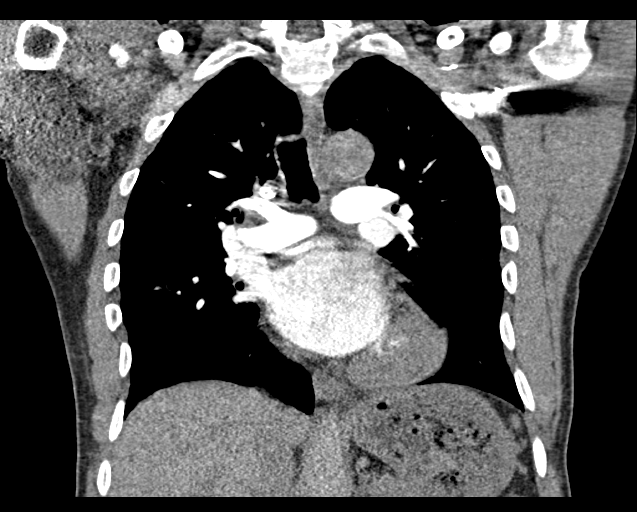
[im 101/135  soft-tissue]
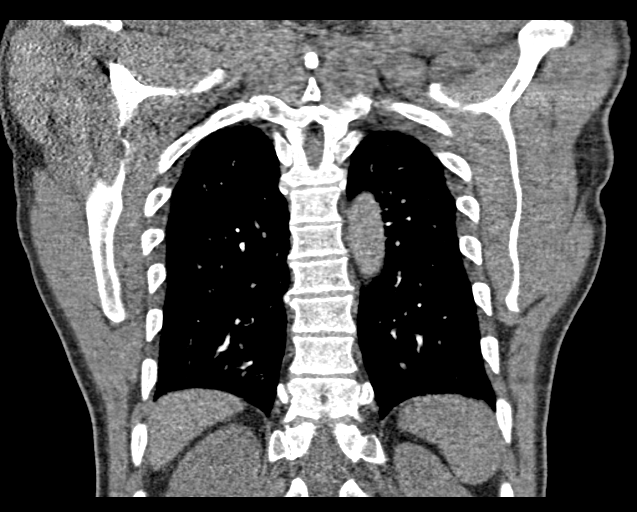

[18 of 46 positions shown; findings below may reference images not displayed]

RADIATION DOSE REDUCTION: This exam was performed according to the
departmental dose-optimization program which includes automated
exposure control, adjustment of the mA and/or kV according to
patient size and/or use of iterative reconstruction technique.

CONTRAST:  80mL OMNIPAQUE IOHEXOL 350 MG/ML SOLN
FINDINGS: Cardiovascular: There are no filling defects within the pulmonary
arteries to suggest pulmonary embolus. Cannot assess for aortic
dissection given phase of contrast tailored to pulmonary artery
assessment. The heart is normal in size. There are coronary artery
calcifications. No pericardial effusion.

Mediastinum/Nodes: No mediastinal or hilar adenopathy. The esophagus
is decompressed. No thyroid nodule.

Lungs/Pleura: Minor subsegmental atelectasis within dependent lower
lobes. No consolidation or evidence of pneumonia. No features of
pulmonary edema. No pleural effusion. No pulmonary mass or
suspicious nodule.

Upper Abdomen: No acute or unexpected findings.

Musculoskeletal: Exaggerated thoracic kyphosis with mild
spondylosis, anterior spurring. Subchondral cyst in the left
glenoid. There are no acute or suspicious osseous abnormalities. No
chest wall soft tissue abnormalities.

Review of the MIP images confirms the above findings.
IMPRESSION: 1. No pulmonary embolus or acute intrathoracic abnormality.
2. Coronary artery calcifications.

## 2023-11-07 IMAGING — CR DG CHEST 2V
2 series · 2 of 2 positions shown · non-contrast
Comparison: 02/03/2021

CLINICAL DATA: Chest pain

EXAM:
CHEST - 2 VIEW

[chest pa]
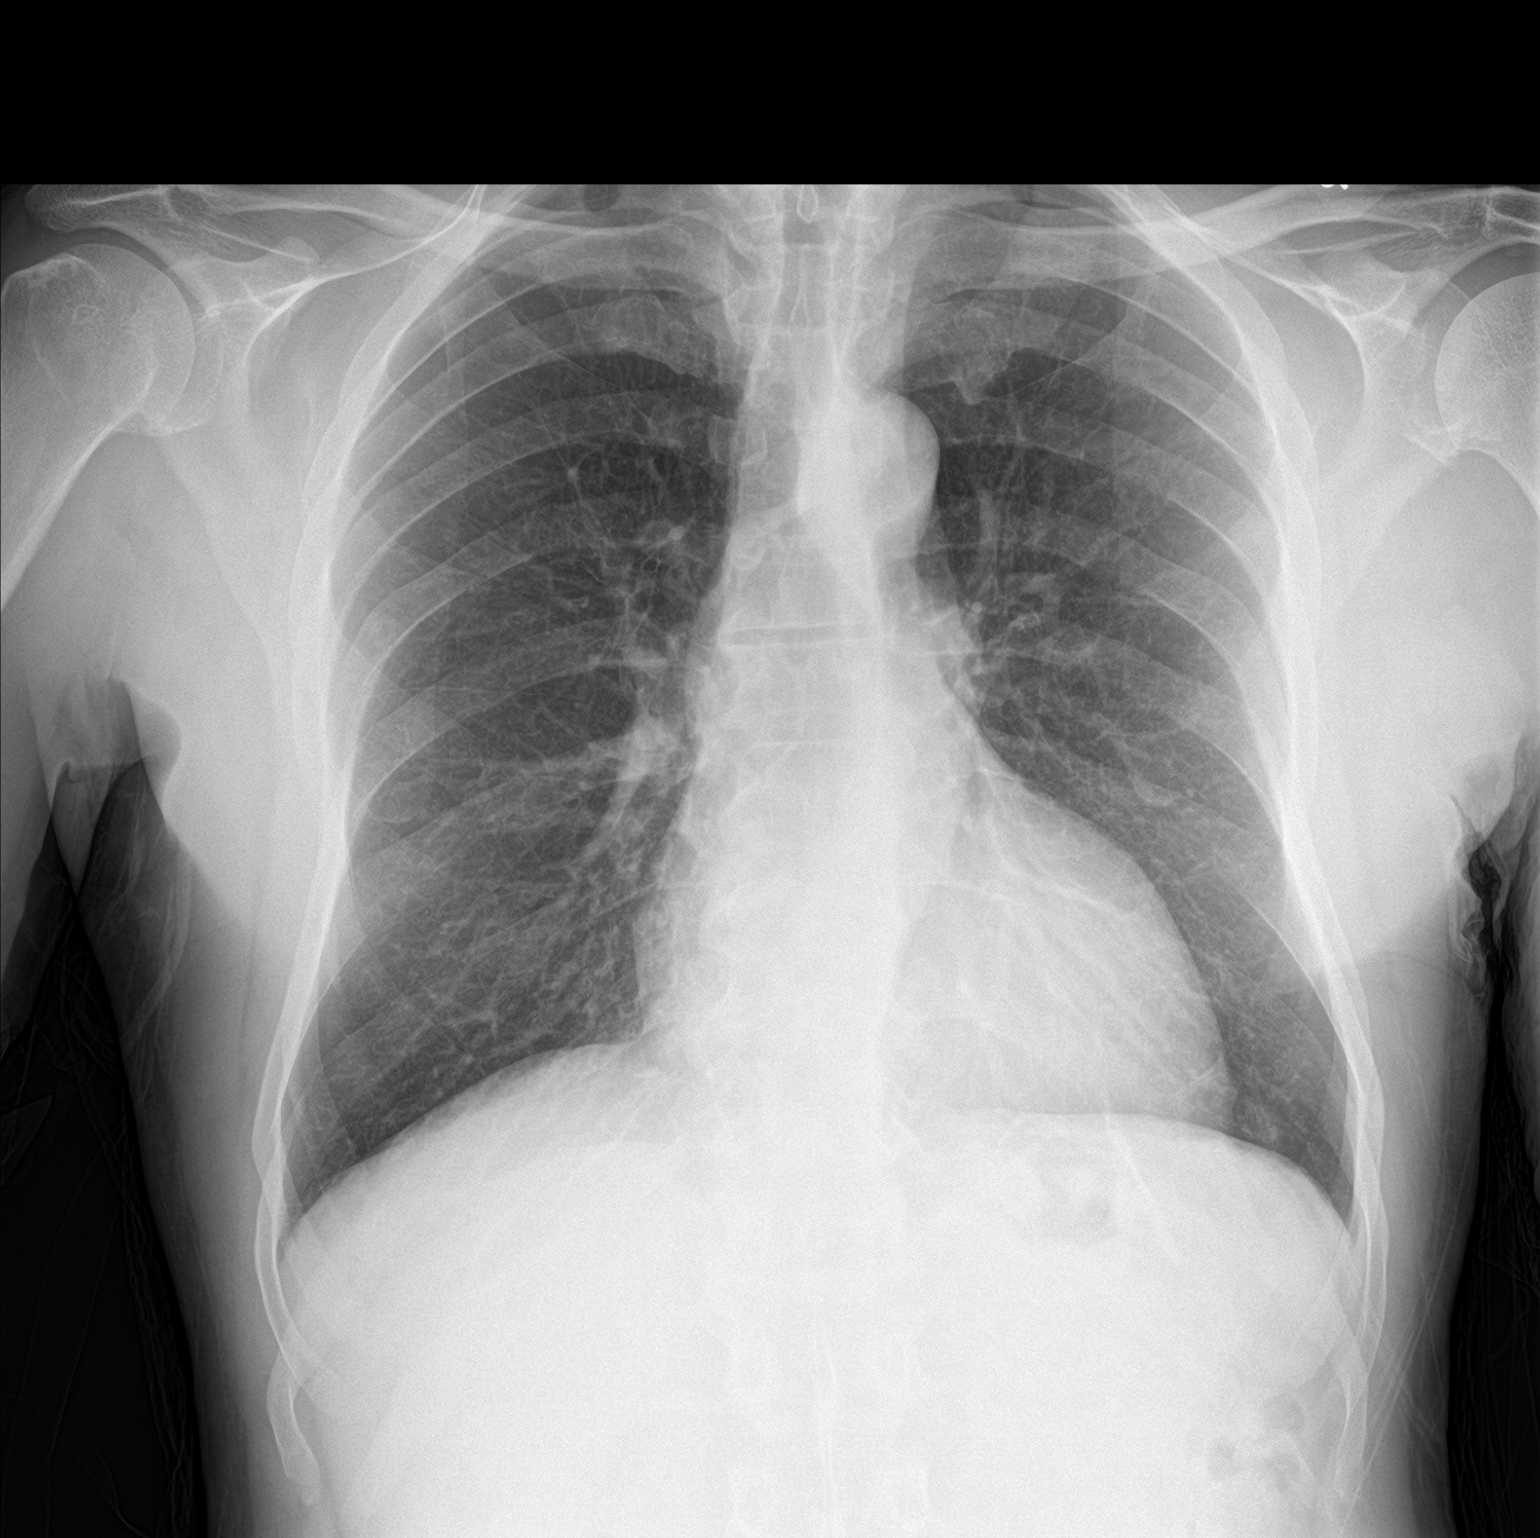

[chest lat]
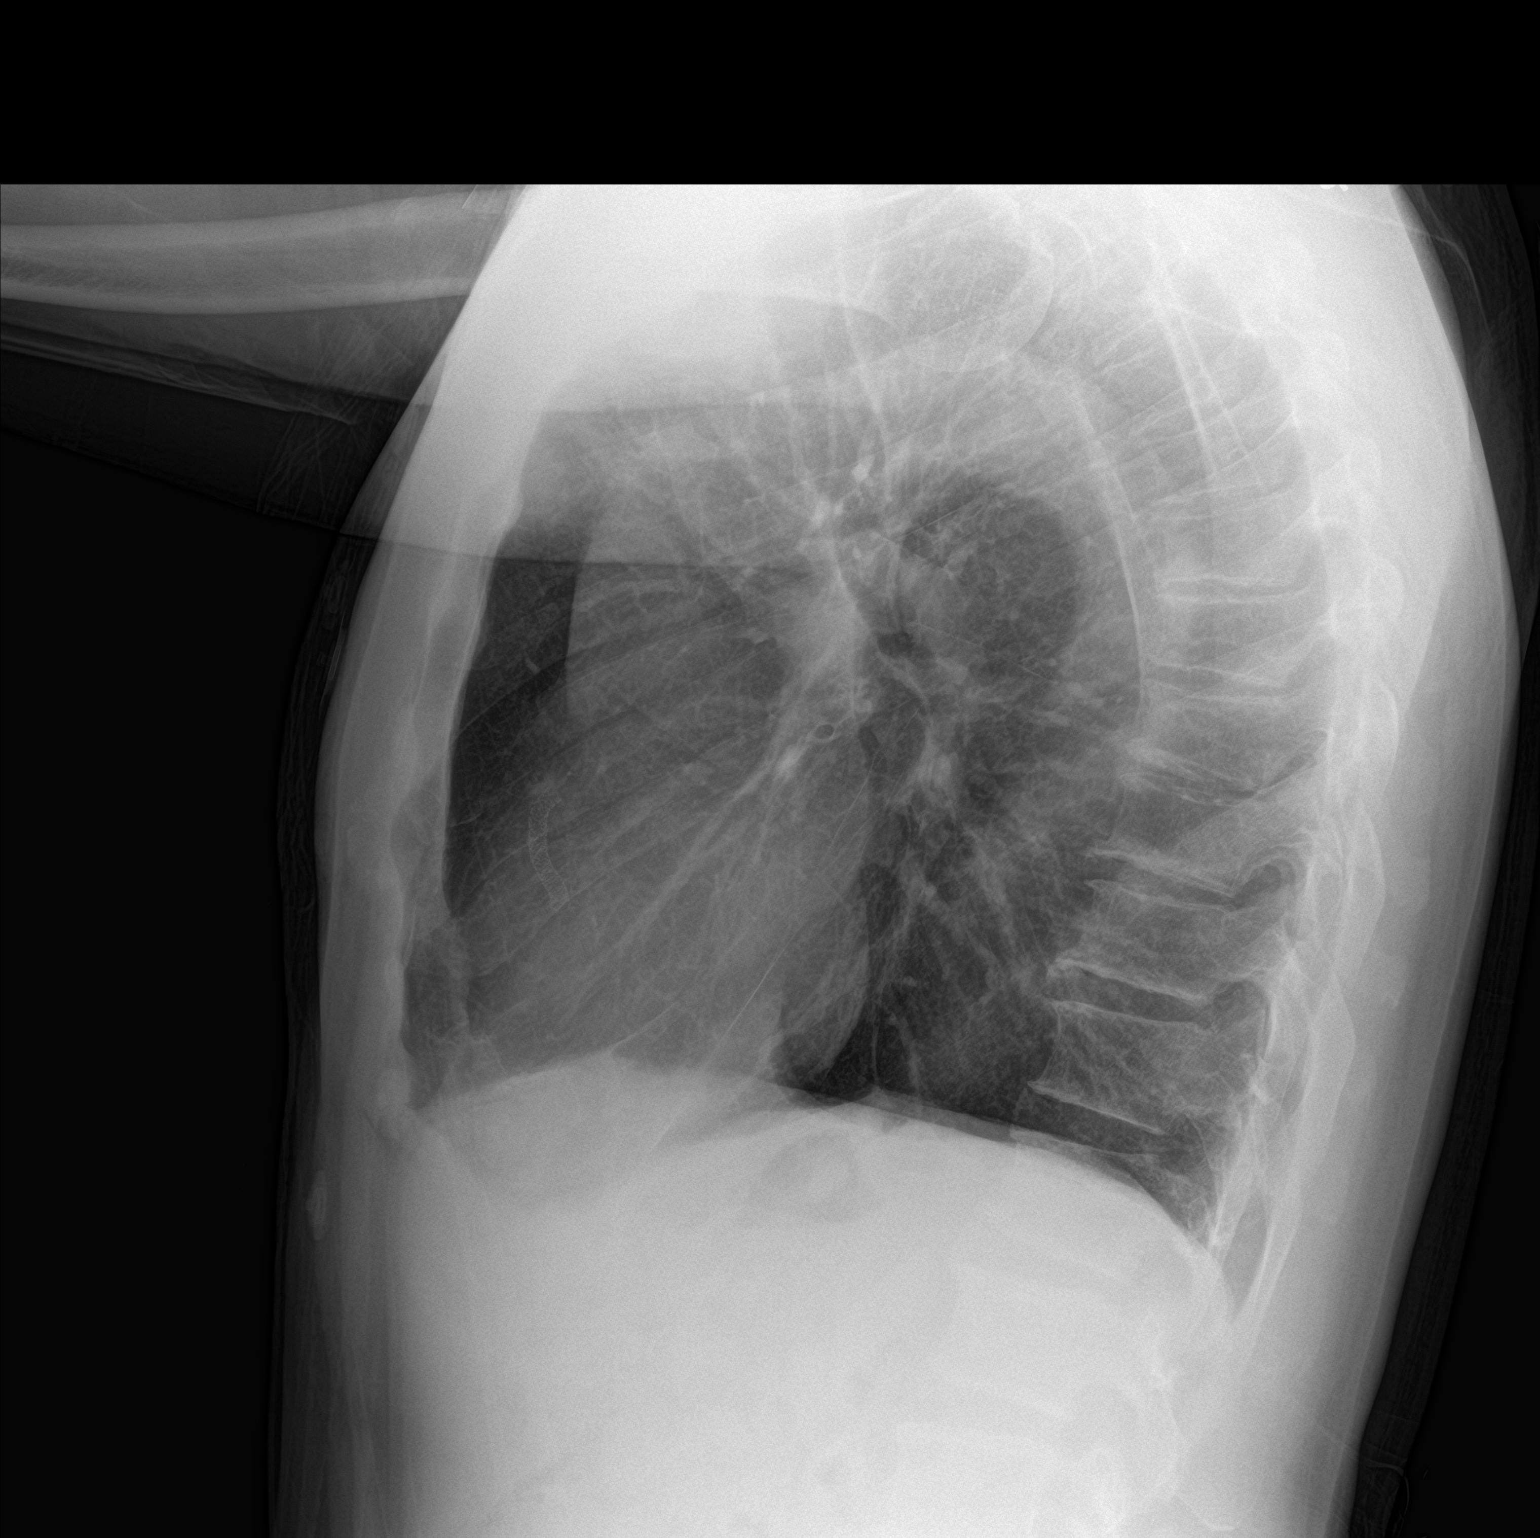

[2 of 2 positions shown; findings below may reference images not displayed]

FINDINGS: The heart size and mediastinal contours are within normal limits.
Coronary artery stent. No pleural effusion or edema. Both lungs are
clear. The visualized skeletal structures are unremarkable.
IMPRESSION: No active cardiopulmonary disease.

## 2024-01-11 ENCOUNTER — Emergency Department (HOSPITAL_BASED_OUTPATIENT_CLINIC_OR_DEPARTMENT_OTHER): Payer: MEDICAID

## 2024-01-11 ENCOUNTER — Other Ambulatory Visit: Payer: Self-pay

## 2024-01-11 ENCOUNTER — Emergency Department (HOSPITAL_BASED_OUTPATIENT_CLINIC_OR_DEPARTMENT_OTHER)
Admission: EM | Admit: 2024-01-11 | Discharge: 2024-01-11 | Disposition: A | Discharge status: Home / Self Care | Payer: MEDICAID | Attending: Emergency Medicine | Admitting: Emergency Medicine

## 2024-01-11 ENCOUNTER — Encounter (HOSPITAL_BASED_OUTPATIENT_CLINIC_OR_DEPARTMENT_OTHER): Payer: Self-pay | Admitting: Emergency Medicine

## 2024-01-11 DIAGNOSIS — Z7901 Long term (current) use of anticoagulants: Secondary | ICD-10-CM | POA: Diagnosis not present

## 2024-01-11 DIAGNOSIS — I10 Essential (primary) hypertension: Secondary | ICD-10-CM | POA: Insufficient documentation

## 2024-01-11 DIAGNOSIS — Z79899 Other long term (current) drug therapy: Secondary | ICD-10-CM | POA: Insufficient documentation

## 2024-01-11 DIAGNOSIS — M25511 Pain in right shoulder: Secondary | ICD-10-CM | POA: Insufficient documentation

## 2024-01-11 MED ORDER — KETOROLAC TROMETHAMINE 15 MG/ML IJ SOLN
INTRAMUSCULAR | Status: AC
  Filled 2024-01-11: qty 1

## 2024-01-11 MED ORDER — KETOROLAC TROMETHAMINE 15 MG/ML IJ SOLN
15.0000 mg | Freq: Once | INTRAMUSCULAR | Status: AC
  Administered 2024-01-11: 15 mg via INTRAMUSCULAR

## 2024-01-11 NOTE — Discharge Instructions (Addendum)
 You were evaluated emergency room for shoulder pain.  Please call the number on the sheet to follow-up with orthopedics.  You may additionally use Tylenol  and ibuprofen  at home.  Please limit your use of the sling at home.  If you experience any new or worsening symptoms including chest pain or shortness of breath please return to emergency room.

## 2024-01-11 NOTE — ED Triage Notes (Signed)
 Pt reports R shoulder pain x 1 week after lifting heavy box at work.  Pain with movement.

## 2024-01-11 NOTE — ED Provider Notes (Signed)
 [place] EMERGENCY DEPARTMENT AT [facility] Provider Note   CSN: [id number] Arrival date & time: 01/11/24  2030     Patient presents with: Shoulder Pain   Lawrence Pope is a 58 y.o. male with history of MI status post PCI, hypertension presents with complaints of right shoulder pain.  Patient states he was lifting a heavy box at work a week ago when he felt like he strained his shoulder.  Pain is worse with shoulder range of motion.  Denies any chest pain or shortness of breath.  No numbness or tingling.  No prior shoulder injuries.    Shoulder Pain  Past Medical History:  Diagnosis Date   Hx of migraines    Hypertension    Myocardial infarction [facility])    Past Surgical History:  Procedure Laterality Date   CORONARY STENT INTERVENTION N/A 03/09/2022   Procedure: CORONARY STENT INTERVENTION;  Surgeon: Court Dorn PARAS, MD;  Location: [facility] INVASIVE CV LAB;  Service: Cardiovascular;  Laterality: N/A;   CORONARY/GRAFT ACUTE MI REVASCULARIZATION N/A 01/28/2021   Procedure: Coronary/Graft Acute MI Revascularization;  Surgeon: Anner Alm ORN, MD;  Location: [facility] INVASIVE CV LAB;  Service: Cardiovascular;  Laterality: N/A;   LEFT HEART CATH AND CORONARY ANGIOGRAPHY N/A 01/28/2021   Procedure: LEFT HEART CATH AND CORONARY ANGIOGRAPHY;  Surgeon: Anner Alm ORN, MD;  Location: [facility] INVASIVE CV LAB;  Service: Cardiovascular;  Laterality: N/A;   LEFT HEART CATH AND CORONARY ANGIOGRAPHY N/A 03/09/2022   Procedure: LEFT HEART CATH AND CORONARY ANGIOGRAPHY;  Surgeon: Court Dorn PARAS, MD;  Location: [facility] INVASIVE CV LAB;  Service: Cardiovascular;  Laterality: N/A;       Prior to Admission medications   Medication Sig Start Date End Date Taking? Authorizing Provider  amLODipine  (NORVASC ) 5 MG tablet Take 1 tablet (5 mg total) by mouth daily. 07/20/23   Doretha Folks, MD  atorvastatin  (LIPITOR ) 80 MG tablet Take 1 tablet (80 mg total) by mouth daily. 07/20/23   Doretha Folks, MD   carvedilol  (COREG ) 12.5 MG tablet Take 1 tablet (12.5 mg total) by mouth 2 (two) times daily. 07/20/23   Doretha Folks, MD  clopidogrel  (PLAVIX ) 75 MG tablet Take 1 tablet (75 mg total) by mouth daily. Patient not taking: Reported on 07/20/2023 05/22/22   Daneen Damien BROCKS, NP  losartan  (COZAAR ) 50 MG tablet Take 1 tablet (50 mg total) by mouth daily. Patient not taking: Reported on 07/20/2023 05/22/22   Daneen Damien BROCKS, NP  nicotine  (NICODERM CQ  - DOSED IN MG/24 HOURS) 14 mg/24hr patch Place 1 patch (14 mg total) onto the skin daily. Patient not taking: Reported on 07/20/2023 03/10/22   Jerrie Anger, PA  nitroGLYCERIN  (NITROSTAT ) 0.4 MG SL tablet Place 1 tablet (0.4 mg total) under the tongue every 5 (five) minutes as needed for chest pain. Patient not taking: Reported on 07/20/2023 05/22/22   Daneen Damien BROCKS, NP    Allergies: Patient has no known allergies.    Review of Systems  Musculoskeletal:  Positive for myalgias.    Updated Vital Signs BP (!) 171/99   Pulse 84   Temp 98.3 F (36.8 C) (Oral)   Resp 15   Ht 5' 11 (1.803 m)   Wt 79.4 kg   SpO2 98%   BMI 24.41 kg/m   Physical Exam Vitals and nursing note reviewed.  Constitutional:      General: He is not in acute distress.    Appearance: He is well-developed.  HENT:     Head: Normocephalic  and atraumatic.  Eyes:     Conjunctiva/sclera: Conjunctivae normal.  Cardiovascular:     Rate and Rhythm: Normal rate and regular rhythm.     Heart sounds: No murmur heard. Pulmonary:     Effort: Pulmonary effort is normal. No respiratory distress.     Breath sounds: Normal breath sounds.  Abdominal:     Palpations: Abdomen is soft.     Tenderness: There is no abdominal tenderness.  Musculoskeletal:        General: No swelling.     Cervical back: Neck supple.     Comments: Tenderness to anterior lateral deltoid, tolerates full range of motion, 4 out of 5 external rotation strength, otherwise 5 out of 5 strength, negative empty  can, positive Hawkins, positive speeds, no overlying erythema warmth  Skin:    General: Skin is warm and dry.     Capillary Refill: Capillary refill takes less than 2 seconds.  Neurological:     Mental Status: He is alert.  Psychiatric:        Mood and Affect: Mood normal.     (all labs ordered are listed, but only abnormal results are displayed) Labs Reviewed - No data to display  EKG: None  Radiology: DG Shoulder Right Result Date: 01/11/2024 CLINICAL DATA:  Right shoulder pain while working on car. EXAM: RIGHT SHOULDER - 2+ VIEW COMPARISON:  None Available. FINDINGS: No acute fracture or dislocation. Subchondral cystic type changes involving the humeral head near the greater tuberosity. Abnormal appearance of the distal clavicle could be related to an unfused secondary ossification center or an old fracture. The visualized ribs are intact. IMPRESSION: No fracture or dislocation. Electronically Signed   By: MYRTIS Stammer M.D.   On: 01/11/2024 21:22     Procedures   Medications Ordered in the ED  ketorolac  (TORADOL ) 15 MG/ML injection 15 mg (15 mg Intramuscular Given 01/11/24 2232)    Clinical Course as of 01/11/24 2238  Tue Jan 11, 2024  2143 DG Shoulder Right No acute findings [JT]  2155 Patient evaluated with complaints of right shoulder pain x 1 week after lifting a heavy box at work.  Patient is without any associated chest pain or shortness of breath.  Is noted to be hypertensive.  Otherwise hemodynamically stable.  On exam he has tenderness to the anterior lateral shoulder.  He does have some reduced strength with external rotation.  Overall his exam is most consistent with musculoskeletal etiology such as tendinitis or rotator cuff pathology.  Will provide Ortho follow-up.  Patient is requesting a sling.  Will provide, however recommended limited use to maintain his range of motion.  Requesting work note.  Provided.  Will give a shot of Toradol  here in the ED. [JT]     Clinical Course User Index [JT] Donnajean Lynwood DEL, PA-C                                 Medical Decision Making Amount and/or Complexity of Data Reviewed Radiology: ordered.   This patient presents to the ED with chief complaint(s) of shoulder pain.  The complaint involves an extensive differential diagnosis and also carries with it a high risk of complications and morbidity.   Pertinent past medical history as listed in HPI  The differential diagnosis includes  Exam is not consistent with septic joint or gout.  There was no fall or significant trauma to be concerned about fracture.  X-ray  is not consistent with this either.  Additional history obtained: Records reviewed Care Everywhere/External Records  Disposition:   Patient will be discharged home. The patient has been appropriately medically screened and/or stabilized in the ED. I have low suspicion for any other emergent medical condition which would require further screening, evaluation or treatment in the ED or require inpatient management. At time of discharge the patient is hemodynamically stable and in no acute distress. I have discussed work-up results and diagnosis with patient and answered all questions. Patient is agreeable with discharge plan. We discussed strict return precautions for returning to the emergency department and they verbalized understanding.     Social Determinants of Health:   none  This note was dictated with voice recognition software.  Despite best efforts at proofreading, errors may have occurred which can change the documentation meaning.       Final diagnoses:  Acute pain of right shoulder    ED Discharge Orders     None          Rooney Gladwin H, PA-C 01/11/24 2238    Cottie Donnice PARAS, MD 01/12/24 1141

## 2024-04-05 ENCOUNTER — Emergency Department (HOSPITAL_COMMUNITY)
Admission: EM | Admit: 2024-04-05 | Discharge: 2024-04-05 | Disposition: A | Discharge status: Home / Self Care | Payer: MEDICAID | Attending: Emergency Medicine | Admitting: Emergency Medicine

## 2024-04-05 ENCOUNTER — Emergency Department (HOSPITAL_COMMUNITY): Payer: MEDICAID

## 2024-04-05 ENCOUNTER — Other Ambulatory Visit: Payer: Self-pay

## 2024-04-05 DIAGNOSIS — Z79899 Other long term (current) drug therapy: Secondary | ICD-10-CM | POA: Diagnosis not present

## 2024-04-05 DIAGNOSIS — R059 Cough, unspecified: Secondary | ICD-10-CM | POA: Diagnosis not present

## 2024-04-05 DIAGNOSIS — I2510 Atherosclerotic heart disease of native coronary artery without angina pectoris: Secondary | ICD-10-CM | POA: Insufficient documentation

## 2024-04-05 DIAGNOSIS — I10 Essential (primary) hypertension: Secondary | ICD-10-CM | POA: Diagnosis not present

## 2024-04-05 DIAGNOSIS — E876 Hypokalemia: Secondary | ICD-10-CM | POA: Diagnosis not present

## 2024-04-05 DIAGNOSIS — D72829 Elevated white blood cell count, unspecified: Secondary | ICD-10-CM | POA: Insufficient documentation

## 2024-04-05 DIAGNOSIS — Z7901 Long term (current) use of anticoagulants: Secondary | ICD-10-CM | POA: Diagnosis not present

## 2024-04-05 DIAGNOSIS — R0602 Shortness of breath: Secondary | ICD-10-CM | POA: Diagnosis present

## 2024-04-05 DIAGNOSIS — J189 Pneumonia, unspecified organism: Secondary | ICD-10-CM | POA: Insufficient documentation

## 2024-04-05 LAB — TROPONIN I (HIGH SENSITIVITY)
Troponin I (High Sensitivity): 19 ng/L — ABNORMAL HIGH (ref <18)
Troponin I (High Sensitivity): 18 ng/L — ABNORMAL HIGH (ref <18)

## 2024-04-05 LAB — BASIC METABOLIC PANEL WITH GFR
Anion gap: 9 (ref 5–15)
BUN: 7 mg/dL (ref 6–20)
CO2: 26 mmol/L (ref 22–32)
Calcium: 8.2 mg/dL — ABNORMAL LOW (ref 8.9–10.3)
Chloride: 101 mmol/L (ref 98–111)
Creatinine, Ser: 1.05 mg/dL (ref 0.61–1.24)
GFR, Estimated: >60 mL/min (ref >60)
Glucose, Bld: 102 mg/dL — ABNORMAL HIGH (ref 70–99)
Potassium: 3.3 mmol/L — ABNORMAL LOW (ref 3.5–5.1)
Sodium: 136 mmol/L (ref 135–145)

## 2024-04-05 LAB — RESP PANEL BY RT-PCR (RSV, FLU A&B, COVID)  RVPGX2
Influenza A by PCR: NEGATIVE
Influenza B by PCR: NEGATIVE
Resp Syncytial Virus by PCR: NEGATIVE
SARS Coronavirus 2 by RT PCR: NEGATIVE

## 2024-04-05 LAB — CBC
HCT: 43.6 % (ref 39.0–52.0)
Hemoglobin: 14.2 g/dL (ref 13.0–17.0)
MCH: 29.9 pg (ref 26.0–34.0)
MCHC: 32.6 g/dL (ref 30.0–36.0)
MCV: 91.8 fL (ref 80.0–100.0)
Platelets: 247 K/uL (ref 150–400)
RBC: 4.75 MIL/uL (ref 4.22–5.81)
RDW: 13.9 % (ref 11.5–15.5)
WBC: 18 K/uL — ABNORMAL HIGH (ref 4.0–10.5)
nRBC: 0 % (ref 0.0–0.2)

## 2024-04-05 MED ORDER — OXYCODONE-ACETAMINOPHEN 5-325 MG PO TABS
1.0000 | ORAL_TABLET | Freq: Once | ORAL | Status: AC
  Administered 2024-04-05: 1 via ORAL
  Filled 2024-04-05: qty 1

## 2024-04-05 MED ORDER — POTASSIUM CHLORIDE CRYS ER 20 MEQ PO TBCR
40.0000 meq | EXTENDED_RELEASE_TABLET | Freq: Once | ORAL | Status: AC
  Administered 2024-04-05: 40 meq via ORAL
  Filled 2024-04-05: qty 2

## 2024-04-05 MED ORDER — LACTATED RINGERS IV BOLUS
500.0000 mL | Freq: Once | INTRAVENOUS | Status: AC
  Administered 2024-04-05: 500 mL via INTRAVENOUS

## 2024-04-05 MED ORDER — ALBUTEROL SULFATE HFA 108 (90 BASE) MCG/ACT IN AERS
2.0000 | INHALATION_SPRAY | Freq: Once | RESPIRATORY_TRACT | Status: AC
  Administered 2024-04-05: 2 via RESPIRATORY_TRACT
  Filled 2024-04-05: qty 6.7

## 2024-04-05 MED ORDER — PREDNISONE 20 MG PO TABS
60.0000 mg | ORAL_TABLET | Freq: Once | ORAL | Status: AC
  Administered 2024-04-05: 60 mg via ORAL
  Filled 2024-04-05: qty 3

## 2024-04-05 MED ORDER — SODIUM CHLORIDE 0.9 % IV SOLN
1.0000 g | Freq: Once | INTRAVENOUS | Status: AC
  Administered 2024-04-05: 1 g via INTRAVENOUS
  Filled 2024-04-05: qty 10

## 2024-04-05 MED ORDER — SODIUM CHLORIDE 0.9 % IV SOLN
500.0000 mg | Freq: Once | INTRAVENOUS | Status: AC
  Administered 2024-04-05: 500 mg via INTRAVENOUS
  Filled 2024-04-05: qty 5

## 2024-04-05 MED ORDER — IPRATROPIUM-ALBUTEROL 0.5-2.5 (3) MG/3ML IN SOLN
3.0000 mL | Freq: Once | RESPIRATORY_TRACT | Status: AC
  Administered 2024-04-05: 3 mL via RESPIRATORY_TRACT
  Filled 2024-04-05: qty 3

## 2024-04-05 MED ORDER — AZITHROMYCIN 250 MG PO TABS: 250.0000 mg | ORAL_TABLET | Freq: Every day | ORAL | 0 refills | Status: AC

## 2024-04-05 MED ORDER — PREDNISONE 10 MG PO TABS: 40.0000 mg | ORAL_TABLET | Freq: Every day | ORAL | 0 refills | Status: AC

## 2024-04-05 MED ORDER — CEFPODOXIME PROXETIL 200 MG PO TABS: 200.0000 mg | ORAL_TABLET | Freq: Two times a day (BID) | ORAL | 0 refills | Status: AC

## 2024-04-05 NOTE — Discharge Instructions (Addendum)
 You received your first doses of antibiotics and steroids in the emergency department.  Prescriptions for ongoing medication were sent to your pharmacy.  Take as prescribed.  Take ibuprofen  and Tylenol  as needed for pain.  Use inhaler as needed for cough and chest tightness.  Return to the emergency department for any new or worsening symptoms of concern.

## 2024-04-05 NOTE — ED Triage Notes (Addendum)
 Pt. Stated, Im a heart pt and for a week I've had a cold and cough with chest pain and SOB. I did Cocaine last nigh 1 gram

## 2024-04-05 NOTE — ED Provider Triage Note (Signed)
 Emergency Medicine Provider Triage Evaluation Note  Fareed Fung , a 58 y.o. male  was evaluated in triage.  Pt complains of right-sided chest pain and shortness of breath.  Patient does have a history of MI 1 year ago with stent placement.  Reports that he does continue to smoke cigars and did admit to using cocaine last night.  States that he has been having a productive cough x 1 week.  Denies any hemoptysis  Review of Systems  Positive: See above Negative: Fever  Physical Exam  BP 138/73 (BP Location: Right Arm)   Pulse 97   Temp 99.5 F (37.5 C)   Resp (!) 34   SpO2 91%  Gen:   Awake, no distress   Resp:  Tachypneic, diminished bilaterally MSK:   Moves extremities without difficulty  Other:  No calf tenderness, trace edema bilaterally  Medical Decision Making  Medically screening exam initiated at 10:14 AM.  Appropriate orders placed.  Khaza Blansett was informed that the remainder of the evaluation will be completed by another provider, this initial triage assessment does not replace that evaluation, and the importance of remaining in the ED until their evaluation is complete.     Ula Prentice SAUNDERS, MD 04/05/24 [phone]

## 2024-04-05 NOTE — ED Provider Notes (Signed)
 [name] Provider Note   CSN: [id number] Arrival date & time: 04/05/24  9055     Patient presents with: Chest Pain, Shortness of Breath, Nasal Congestion, and Cough   Lawrence Pope is a 58 y.o. male.    Chest Pain Associated symptoms: cough and shortness of breath   Shortness of Breath Associated symptoms: chest pain and cough   Cough Associated symptoms: chest pain and shortness of breath   Patient presents for chest pain and shortness of breath.  Medical history includes CAD, polysubstance abuse, HTN, HLD, migraines.  He has prior stenting to RCA.  2 years ago, he underwent stenting to LAD.  He has had cough and chest congestion for the past week.  He used cocaine last night.  Today he has had worsening shortness of breath and right-sided chest pain.  He does not use breathing treatments at home but does have a long smoking history.  He has had chest congestion and tightness.     Prior to Admission medications   Medication Sig Start Date End Date Taking? Authorizing Provider  azithromycin  (ZITHROMAX ) 250 MG tablet Take 1 tablet (250 mg total) by mouth daily. Take first 1 every day until finished, starting tomorrow. 04/05/24  Yes Melvenia Motto, MD  cefpodoxime (VANTIN) 200 MG tablet Take 1 tablet (200 mg total) by mouth 2 (two) times daily for 5 days. Starting tomorrow 04/05/24 04/10/24 Yes Melvenia Motto, MD  predniSONE  (DELTASONE ) 10 MG tablet Take 4 tablets (40 mg total) by mouth daily for 4 days. Starting tomorrow 04/05/24 04/09/24 Yes Melvenia Motto, MD  amLODipine  (NORVASC ) 5 MG tablet Take 1 tablet (5 mg total) by mouth daily. 07/20/23   Doretha Folks, MD  atorvastatin  (LIPITOR ) 80 MG tablet Take 1 tablet (80 mg total) by mouth daily. 07/20/23   Doretha Folks, MD  carvedilol  (COREG ) 12.5 MG tablet Take 1 tablet (12.5 mg total) by mouth 2 (two) times daily. 07/20/23   Doretha Folks, MD  clopidogrel  (PLAVIX ) 75 MG tablet Take  1 tablet (75 mg total) by mouth daily. Patient not taking: Reported on 07/20/2023 05/22/22   Daneen Damien BROCKS, NP  losartan  (COZAAR ) 50 MG tablet Take 1 tablet (50 mg total) by mouth daily. Patient not taking: Reported on 07/20/2023 05/22/22   Daneen Damien BROCKS, NP  nicotine  (NICODERM CQ  - DOSED IN MG/24 HOURS) 14 mg/24hr patch Place 1 patch (14 mg total) onto the skin daily. Patient not taking: Reported on 07/20/2023 03/10/22   Jerrie Anger, PA  nitroGLYCERIN  (NITROSTAT ) 0.4 MG SL tablet Place 1 tablet (0.4 mg total) under the tongue every 5 (five) minutes as needed for chest pain. Patient not taking: Reported on 07/20/2023 05/22/22   Daneen Damien BROCKS, NP    Allergies: Patient has no known allergies.    Review of Systems  HENT:  Positive for congestion.   Respiratory:  Positive for cough and shortness of breath.   Cardiovascular:  Positive for chest pain.  All other systems reviewed and are negative.   Updated Vital Signs BP (!) 155/79   Pulse 85   Temp 98.9 F (37.2 C) (Oral)   Resp 19   SpO2 97%   Physical Exam Vitals and nursing note reviewed.  Constitutional:      General: He is not in acute distress.    Appearance: He is well-developed. He is not ill-appearing, toxic-appearing or diaphoretic.  HENT:     Head: Normocephalic and atraumatic.  Eyes:  Conjunctiva/sclera: Conjunctivae normal.  Cardiovascular:     Rate and Rhythm: Normal rate and regular rhythm.     Heart sounds: No murmur heard. Pulmonary:     Effort: Pulmonary effort is normal. No respiratory distress.     Breath sounds: Rhonchi present. No decreased breath sounds.  Abdominal:     Palpations: Abdomen is soft.     Tenderness: There is no abdominal tenderness.  Musculoskeletal:        General: No swelling. Normal range of motion.     Cervical back: Normal range of motion and neck supple.  Skin:    General: Skin is warm and dry.     Coloration: Skin is not cyanotic or pale.  Neurological:     General: No  focal deficit present.     Mental Status: He is alert and oriented to person, place, and time.  Psychiatric:        Mood and Affect: Mood normal.        Behavior: Behavior normal.     (all labs ordered are listed, but only abnormal results are displayed) Labs Reviewed  BASIC METABOLIC PANEL WITH GFR - Abnormal; Notable for the following components:      Result Value   Potassium 3.3 (*)    Glucose, Bld 102 (*)    Calcium  8.2 (*)    All other components within normal limits  CBC - Abnormal; Notable for the following components:   WBC 18.0 (*)    All other components within normal limits  TROPONIN I (HIGH SENSITIVITY) - Abnormal; Notable for the following components:   Troponin I (High Sensitivity) 19 (*)    All other components within normal limits  TROPONIN I (HIGH SENSITIVITY) - Abnormal; Notable for the following components:   Troponin I (High Sensitivity) 18 (*)    All other components within normal limits  RESP PANEL BY RT-PCR (RSV, FLU A&B, COVID)  [id number]  RAPID URINE DRUG SCREEN, HOSP PERFORMED    EKG: EKG Interpretation Date/Time:  Wednesday April 05 2024 09:55:23 EST Ventricular Rate:  99 PR Interval:  156 QRS Duration:  94 QT Interval:  352 QTC Calculation: 451 R Axis:   53  Text Interpretation: Normal sinus rhythm Moderate voltage criteria for LVH, may be normal variant ( Sokolow-Lyon , Cornell product ) Anteroseptal infarct , age undetermined Abnormal ECG Confirmed by Melvenia Motto [phone]) on 04/05/2024 3:08:20 PM  Radiology: DG Chest 2 View Result Date: 04/05/2024 EXAM: 2 VIEW(S) XRAY OF THE CHEST 04/05/2024 10:23:00 AM COMPARISON: 07/20/2023. Mild right upper and lower lobe airspace opacities are noted concerning for pneumonia. CLINICAL HISTORY: chest pain FINDINGS: LUNGS AND PLEURA: Mild right upper and lower lobe airspace opacities are noted concerning for pneumonia. No pleural effusion. No pneumothorax. HEART AND MEDIASTINUM: No acute abnormality of the  cardiac and mediastinal silhouettes. BONES AND SOFT TISSUES: No acute osseous abnormality. IMPRESSION: 1. Mild right upper and lower lobe airspace opacities, suspicious for pneumonia. Electronically signed by: Lynwood Seip MD 04/05/2024 10:53 AM EST RP Workstation: [id number]     Procedures   Medications Ordered in the ED  ipratropium-albuterol  (DUONEB) 0.5-2.5 (3) MG/3ML nebulizer solution 3 mL (3 mLs Nebulization Given 04/05/24 1558)  oxyCODONE -acetaminophen  (PERCOCET/ROXICET) 5-325 MG per tablet 1 tablet (1 tablet Oral Given 04/05/24 1556)  cefTRIAXone  (ROCEPHIN ) 1 g in sodium chloride  0.9 % 100 mL IVPB (0 g Intravenous Stopped 04/05/24 1702)  azithromycin  (ZITHROMAX ) 500 mg in sodium chloride  0.9 % 250 mL IVPB (0 mg Intravenous Stopped 04/05/24 1806)  lactated ringers  bolus 500 mL (0 mLs Intravenous Stopped 04/05/24 1806)  potassium chloride  SA (KLOR-CON  M) CR tablet 40 mEq (40 mEq Oral Given 04/05/24 1733)  albuterol  (VENTOLIN  HFA) 108 (90 Base) MCG/ACT inhaler 2 puff (2 puffs Inhalation Given 04/05/24 1734)  predniSONE  (DELTASONE ) tablet 60 mg (60 mg Oral Given 04/05/24 1733)                                    Medical Decision Making Amount and/or Complexity of Data Reviewed Labs: ordered. Radiology: ordered.  Risk Prescription drug management.   This patient presents to the ED for concern of chest pain and shortness of breath, this involves an extensive number of treatment options, and is a complaint that carries with it a high risk of complications and morbidity.  The differential diagnosis includes ACS, reactive airway disease, pneumonia, CHF, pleural effusion   Co morbidities / Chronic conditions that complicate the patient evaluation  CAD, polysubstance abuse, HTN, HLD, migraines   Additional history obtained:  Additional history obtained from EMR External records from outside source obtained and reviewed including N/A   Lab Tests:  I Ordered, and personally  interpreted labs.  The pertinent results include: Leukocytosis is present.  Mild hypokalemia is present.  Troponin is normal.   Imaging Studies ordered:  I ordered imaging studies including chest x-ray I independently visualized and interpreted imaging which showed right-sided pneumonia I agree with the radiologist interpretation   Cardiac Monitoring: / EKG:  The patient was maintained on a cardiac monitor.  I personally viewed and interpreted the cardiac monitored which showed an underlying rhythm of: Sinus rhythm   Problem List / ED Course / Critical interventions / Medication management  Patient presenting for chest pain and shortness of breath.  Additional symptoms include cough for the past week.  Vital signs on arrival are notable for a low-grade temperature of 100 degrees.  SpO2 is 93% on room air.  Prior to being bedded in the ED, workup was initiated.  Initial lab work notable for a leukocytosis and mild hypokalemia.  Chest x-ray shows evidence of pneumonia.  On exam, patient is overall well-appearing.  Cough is present.  He is able to speak in complete sentences.  He has some rhonchi on lung auscultation.  Area of chest pain is consistent with x-ray findings of pneumonia.  Antibiotics were ordered.  Will give breathing treatment and pain medication as well.  On reassessment, patient resting comfortably.  On trial of ambulation, he maintained SpO2 of 98%.  Medications were prescribed.  Patient was discharged in good condition. I ordered medication including IV fluids for hydration, ceftriaxone  and azithromycin  for pneumonia; DuoNeb and prednisone  for reactive airway disease; potassium chloride  for hypokalemia; Percocet for analgesia Reevaluation of the patient after these medicines showed that the patient improved I have reviewed the patients home medicines and have made adjustments as needed  Social Determinants of Health:  Lives independently     Final diagnoses:  Pneumonia of  right lung due to infectious organism, unspecified part of lung    ED Discharge Orders          Ordered    azithromycin  (ZITHROMAX ) 250 MG tablet  Daily        04/05/24 1719    cefpodoxime (VANTIN) 200 MG tablet  2 times daily        04/05/24 1719    predniSONE  (DELTASONE ) 10 MG tablet  Daily        04/05/24 1719               Melvenia Motto, MD 04/05/24 2045
